# Patient Record
Sex: Female | Born: 1940 | Race: White | Hispanic: No | Smoking: Never smoker
Health system: Southern US, Community
[De-identification: ages and names within clinical notes are randomized; demographics above are authoritative.]

## PROBLEM LIST (undated history)

## (undated) DIAGNOSIS — M797 Fibromyalgia: Secondary | ICD-10-CM

## (undated) DIAGNOSIS — M5431 Sciatica, right side: Secondary | ICD-10-CM

## (undated) DIAGNOSIS — J45909 Unspecified asthma, uncomplicated: Secondary | ICD-10-CM

## (undated) DIAGNOSIS — M719 Bursopathy, unspecified: Secondary | ICD-10-CM

## (undated) DIAGNOSIS — M1990 Unspecified osteoarthritis, unspecified site: Secondary | ICD-10-CM

## (undated) DIAGNOSIS — G4700 Insomnia, unspecified: Secondary | ICD-10-CM

## (undated) DIAGNOSIS — K219 Gastro-esophageal reflux disease without esophagitis: Secondary | ICD-10-CM

## (undated) DIAGNOSIS — E559 Vitamin D deficiency, unspecified: Secondary | ICD-10-CM

## (undated) HISTORY — DX: Bursopathy, unspecified: M71.9

## (undated) HISTORY — PX: OTHER SURGICAL HISTORY: SHX169

## (undated) HISTORY — DX: Insomnia, unspecified: G47.00

## (undated) HISTORY — DX: Sciatica, right side: M54.31

## (undated) HISTORY — DX: Vitamin D deficiency, unspecified: E55.9

---

## 1997-01-12 HISTORY — PX: OTHER SURGICAL HISTORY: SHX169

## 1997-11-22 ENCOUNTER — Ambulatory Visit (HOSPITAL_BASED_OUTPATIENT_CLINIC_OR_DEPARTMENT_OTHER): Admission: RE | Admit: 1997-11-22 | Discharge: 1997-11-22 | Payer: Self-pay | Admitting: Orthopaedic Surgery

## 1998-01-12 HISTORY — PX: ABDOMINAL HYSTERECTOMY: SHX81

## 1999-11-20 ENCOUNTER — Encounter: Admission: RE | Admit: 1999-11-20 | Discharge: 1999-11-20 | Payer: Self-pay | Admitting: Allergy and Immunology

## 1999-11-20 ENCOUNTER — Encounter: Payer: Self-pay | Admitting: Allergy and Immunology

## 2001-03-21 ENCOUNTER — Other Ambulatory Visit: Admission: RE | Admit: 2001-03-21 | Discharge: 2001-03-21 | Payer: Self-pay | Admitting: Obstetrics and Gynecology

## 2001-05-11 ENCOUNTER — Encounter: Admission: RE | Admit: 2001-05-11 | Discharge: 2001-05-11 | Payer: Self-pay | Admitting: Internal Medicine

## 2001-05-11 ENCOUNTER — Encounter: Payer: Self-pay | Admitting: Internal Medicine

## 2001-06-29 ENCOUNTER — Encounter: Payer: Self-pay | Admitting: Neurosurgery

## 2001-07-01 ENCOUNTER — Encounter: Payer: Self-pay | Admitting: Neurosurgery

## 2001-07-01 ENCOUNTER — Observation Stay (HOSPITAL_COMMUNITY): Admission: RE | Admit: 2001-07-01 | Discharge: 2001-07-02 | Payer: Self-pay | Admitting: Neurosurgery

## 2001-11-14 ENCOUNTER — Ambulatory Visit (HOSPITAL_COMMUNITY): Admission: RE | Admit: 2001-11-14 | Discharge: 2001-11-14 | Payer: Self-pay | Admitting: Gastroenterology

## 2002-01-12 HISTORY — PX: LASIK: SHX215

## 2002-01-12 HISTORY — PX: CERVICAL DISC SURGERY: SHX588

## 2002-04-18 ENCOUNTER — Other Ambulatory Visit: Admission: RE | Admit: 2002-04-18 | Discharge: 2002-04-18 | Payer: Self-pay | Admitting: Obstetrics and Gynecology

## 2002-04-26 ENCOUNTER — Ambulatory Visit (HOSPITAL_COMMUNITY): Admission: RE | Admit: 2002-04-26 | Discharge: 2002-04-26 | Payer: Self-pay | Admitting: Gastroenterology

## 2002-08-08 ENCOUNTER — Encounter: Admission: RE | Admit: 2002-08-08 | Discharge: 2002-08-08 | Payer: Self-pay | Admitting: Gastroenterology

## 2002-08-08 ENCOUNTER — Encounter: Payer: Self-pay | Admitting: Gastroenterology

## 2003-05-07 ENCOUNTER — Other Ambulatory Visit: Admission: RE | Admit: 2003-05-07 | Discharge: 2003-05-07 | Payer: Self-pay | Admitting: Obstetrics and Gynecology

## 2004-02-11 ENCOUNTER — Encounter: Admission: RE | Admit: 2004-02-11 | Discharge: 2004-02-11 | Payer: Self-pay | Admitting: Gastroenterology

## 2005-02-05 ENCOUNTER — Encounter: Admission: RE | Admit: 2005-02-05 | Discharge: 2005-02-05 | Payer: Self-pay | Admitting: Gastroenterology

## 2005-02-23 ENCOUNTER — Encounter: Admission: RE | Admit: 2005-02-23 | Discharge: 2005-02-23 | Payer: Self-pay | Admitting: Gastroenterology

## 2006-01-12 HISTORY — PX: OTHER SURGICAL HISTORY: SHX169

## 2006-06-18 ENCOUNTER — Encounter: Admission: RE | Admit: 2006-06-18 | Discharge: 2006-06-18 | Payer: Self-pay | Admitting: Allergy and Immunology

## 2006-07-26 ENCOUNTER — Encounter: Admission: RE | Admit: 2006-07-26 | Discharge: 2006-07-26 | Payer: Self-pay | Admitting: Internal Medicine

## 2007-05-11 ENCOUNTER — Encounter: Admission: RE | Admit: 2007-05-11 | Discharge: 2007-05-11 | Payer: Self-pay | Admitting: Neurosurgery

## 2008-11-28 ENCOUNTER — Other Ambulatory Visit: Admission: RE | Admit: 2008-11-28 | Discharge: 2008-11-28 | Payer: Self-pay | Admitting: Internal Medicine

## 2010-01-17 ENCOUNTER — Encounter
Admission: RE | Admit: 2010-01-17 | Discharge: 2010-01-17 | Payer: Self-pay | Source: Home / Self Care | Attending: Neurological Surgery | Admitting: Neurological Surgery

## 2010-02-01 ENCOUNTER — Encounter: Payer: Self-pay | Admitting: Gastroenterology

## 2010-02-02 ENCOUNTER — Encounter: Payer: Self-pay | Admitting: Gastroenterology

## 2010-05-30 NOTE — Op Note (Signed)
NAME:  Susan Li, Susan Li                           ACCOUNT NO.:  [id number]   MEDICAL RECORD NO.:  [id number]                   PATIENT TYPE:  AMB   LOCATION:  ENDO                                 FACILITY:  [facility]   PHYSICIAN:  Danise Edge, M.D.                DATE OF BIRTH:  02-05-1940   DATE OF PROCEDURE:  04/26/2002  DATE OF DISCHARGE:                                 OPERATIVE REPORT   REFERRING PHYSICIAN:  Candyce Churn, M.D.   PROCEDURE:  Esophagogastroduodenoscopy.   PROCEDURE INDICATION:  Ms. [name] is a 70 year old female, born September 03, 1940.  Ms. [name] has a globus sensation (waxing and waning foreign  body sensation in her hypopharynx).  It does not interfere with swallowing  liquids or solids.  Her sensation seems to have worsened since she underwent  cervical disk surgery with placement of a titanium plate.  Ms. [name] also  has uncontrolled gastroesophageal reflux, manifested by regurgitation and  heartburn.   ENDOSCOPIST:  Charolett Bumpers, M.D.   PREMEDICATION:  1. Versed 10 mg.  2. Demerol 50 mg.   DESCRIPTION OF PROCEDURE:  After obtaining informed consent, Ms. [name] was  placed in the left lateral decubitus position.  I administered intravenous  Demerol and intravenous Versed to achieve conscious sedation for the  procedure.  The patient's blood pressure, oxygen saturation, and cardiac  rhythm were monitored throughout the procedure and documented in the medical  record.   The Olympus gastroscope was passed through the posterior hypopharynx into  the proximal esophagus without difficulty.  Close examination of the  hypopharynx reveals marked edema of the arytenoid cartilages with a normal-  appearing set of vocal cords.  There were no identifiable masses or tumors.  The endoscope easily was passed into the proximal esophagus.   ESOPHAGOSCOPY:  The proximal, mid, and lower segments of the esophagus  appear normal.  The squamocolumnar  junction and esophagogastric junction are  noted at 40 cm from the incisor teeth.  There is no endoscopic evidence for  the presence of Barrett's esophagus, erosive esophagitis, or esophageal  erosions.   GASTROSCOPY:  Retroflexed view of the gastric cardia and fundus was normal.  The gastric body, antrum, and pylorus appeared normal.   DUODENOSCOPY:  The duodenal bulb and descending duodenum appeared normal.   ASSESSMENT:  Normal esophagogastroduodenoscopy.  I am impressed with the  edema of her arytenoid cartilages.   RECOMMENDATIONS:  I suspect Ms. [name]' globus sensation is due to  gastroesophageal laryngeal reflux and not related to the placement of a  titanium plate in her cervical spine.  She is currently only on an H2  blocker.  She continues to have heartburn and regurgitation.   I would recommend placing Ms. [name] on a double dose proton pump  inhibitor.  She did develop diarrhea on Prevacid.  Danise Edge, M.D.    MJ/MEDQ  D:  04/26/2002  T:  04/26/2002  Job:  [id number]   cc:   Candyce Churn, M.D.  [address] [place]  [place] [address]  Fax: [phone]   [name]. [name], M.D.  [address]  [place]  [place] [address]  Fax: [phone]

## 2010-05-30 NOTE — Op Note (Signed)
   NAME:  Susan Li, Susan Li                           ACCOUNT NO.:  [id number]   MEDICAL RECORD NO.:  [id number]                   PATIENT TYPE:  AMB   LOCATION:  ENDO                                 FACILITY[facility]   PHYSICIAN:  Danise Edge, M.D.                DATE OF BIRTH:  Nov 06, 1940   DATE OF PROCEDURE:  11/14/2001  DATE OF DISCHARGE:                                 OPERATIVE REPORT   REFERRING PHYSICIAN:  Candyce Churn, M.D.   PROCEDURE:  Colonoscopy.   PROCEDURE INDICATION:  The patient is a 70 year old female born August 01, 1940.  The patient is scheduled to undergo her first screening colonoscopy  with polypectomy to prevent colon cancer.  I discussed with her the  complications associated with colonoscopy and polypectomy including a 15 per  1000 risk of bleeding and 1 per 1000 risk of colon perforation requiring  surgical repair.  The patient has signed the operative permit.   ENDOSCOPIST:  Charolett Bumpers, M.D.   PREMEDICATION:  Versed 9.5 mg, Demerol 50 mg.   INSTRUMENT USED:  Olympus pediatric colonoscope.   PROCEDURE:  After obtaining informed consent, the patient was placed in the  left lateral decubitus position.  I administered intravenous Demerol and  intravenous Versed to achieve conscious sedation for the procedure.  The  patient's blood pressure, oxygen saturation, and cardiac rhythm were  monitored throughout the procedure and documented in the medical record.   Anal inspection was normal.  Digital rectal examination was normal.  The  Olympus pediatric video colonoscope was introduced into the rectum and  advanced to the cecum.  Colonic preparation for the exam today was  excellent.   Rectum:  Normal.   Sigmoid colon and descending colon:  Normal.   Splenic flexure:  Normal.   Transverse colon:  Normal.   Hepatic flexure:  Normal.   Ascending colon:  Normal.   Cecum and ileocecal valve:  Normal.    ASSESSMENT:  Normal screening  proctocolonoscopy to the cecum.  No endoscopic  evidence for the presence of colorectal neoplasia.                                                   Danise Edge, M.D.    MJ/MEDQ  D:  11/14/2001  T:  11/14/2001  Job:  [id number]   cc:   Candyce Churn, M.D.  [address] [place]  [place] [address]  Fax: [phone]

## 2010-05-30 NOTE — Op Note (Signed)
[place]  Patient:    Susan Li, Susan Li Visit Number: [id number] MRN: [id number]          Service Type: SUR Location: [id number] Attending Physician:  Josie Saunders Dictated by: [name] [name], M.D. Proc. Date: 07/01/01 Admit Date: 07/01/2001 Discharge Date: 07/02/2001             Operative Report  PREOPERATIVE DIAGNOSIS:  Herniated cervical disk and cervical spondylosis, degenerative disk disease and cervical radiculopathy at C5-6 and C6-7 levels.  POSTOPERATIVE DIAGNOSIS:  Herniated cervical disk and cervical spondylosis, degenerative disk disease and cervical radiculopathy at C5-6 and C6-7 levels  OPERATION PERFORMED:  Anterior cervical diskectomy and fusion at C5-6 and C6-7 levels with allograft bone graft and anterior cervical plate.  SURGEON:  [name]. [name], M.D.  ASSISTANT:  Hewitt Shorts, M.D.  ANESTHESIA:  General endotracheal.  ESTIMATED BLOOD LOSS:  Less than 50 cc.  COMPLICATIONS:  None.  DISPOSITION:  Recovery.  INDICATIONS FOR PROCEDURE:  The patient is a 70 year old woman with left upper extremity pain and weakness secondary to cervical spondylitic foraminal stenosis and radiculopathy at the C5-6 and C6-7 levels.  It was elected to take her to surgery for anterior cervical diskectomyand fusion at those affected levels.  DESCRIPTION OF PROCEDURE:The patient was brought to the operating room. Following satisfactory and uncomplicated induction of general endotracheal anesthesia and placement of intravenous lines, the patient was placed in a supine position on the operating table.  Her neck was placed in slight extension and she was placed in 10 pounds of halter traction.  Her anterior neck was prepped and draped in the usual sterile fashion.  The area of planned incision was infiltrated with 0.25% Marcaine and 0.5% lidocaine with 1:200,000 epinephrine.  Incision was made from the midline to the anterior  border of the sternocleidomastoid muscle and carried sharply to the platysmal layer. Subplatysmal dissection was performed and the anterior cervical spine was exposed between the carotid sheath lateral and trachea and esophagus medial and a spinal needle was placed at what was felt to be the C5-6 level and intraoperative x-ray confirmed this to be the C5-6 level.  Subsequently the longus coli muscles were taken down from the anterior cervical spine fromC5 through C7 levels bilaterally using electrocautery and periosteal elevator. The Shadowline self-retaining retractor was placed to facilitate exposure as was up and down retraction.  The disk spaces at C5-6 and C6-7were then incised with a 15 blade and disk material was removed in piecemeal fashion. The interspaces curetted with Carlens microcurets.  The microscope was brought into the field.  Disk space spreaders were used to facilitate exposure. There was marked spondylitic disease at each level.  C5-6 was more affected than the C7 level.  There was significant uncinatespurring left, greater than right, C5-6 greater than C6-7.  These spurs were drilled down with the Anspach drill with an A-2 equivalent bur and subsequently the posterior longitudinal ligament was incised with an arachnoid knife and removed in a piecemeal fashion.  Both the C7 nerve roots were decompressed as was the central spinal cord dura.  Subsequently, an 8mm iliac crest tricortical bone graft was found to fit appropriately after sizing with NuVasive sizers and then was inserted and countersunk appropriately.  Attention was then turned to the C5-6 level where similar decompression and drilling of the osteophytes was performed. The central spinal cord dura was decompressed.  The C6 nerve root had a very vertical take off and had been  significantly compressed by spondylitic material as well as a large uncinate spur. Upon removal of the uncinate spur the nerve root was  felt to be well decompressed.  The right side was not as affected as the left.  Again, hemostasis was assured with Gelfoam soaked in thrombin and a similarly sized 8 mm tricortical iliac crest bone graft was inserted in the interspace and countersunk appropriately.  Subsequently, the microscope was taken out of the field and a 34 mm anterior cervical reflex cervical plate was then affixed to the anterior cervical spine using 12 mm x 4.0 mm variable angle screws two at C5, two at C6 and two at C7. Allscrews had excellent purchase.  Locking mechanisms were engaged at each level.  The plate was affixed well at the anterior cervical spine.  The final x-ray confirmed positioning of the bone graft at the anterior cervical plate.  The wound was then copiously irrigated with bacitracin saline. The halter traction had been removed.  The platysma layer was reapproximated with 3-0 Vicryl sutures and skin edges were reapproximated with running 4-0 Vicryl subcuticular stitch.  The wound was dressed with benzoin and Steri-Strips, Telfa gauze and tape.  The patient was extubated in theoperating room and taken to recovery room in stable and satisfactory condition having tolerated the operation well.  Counts were correct at the end of the case. Dictated by:   [name] [name], M.D. Attending Physician:  Josie Saunders DD:  07/01/01 TD:  07/04/01 Job: [id number]

## 2010-11-05 ENCOUNTER — Inpatient Hospital Stay (HOSPITAL_BASED_OUTPATIENT_CLINIC_OR_DEPARTMENT_OTHER)
Admission: RE | Admit: 2010-11-05 | Discharge: 2010-11-05 | Disposition: A | Discharge status: Home / Self Care | Payer: Medicare Other | Source: Ambulatory Visit | Attending: Orthopedic Surgery | Admitting: Orthopedic Surgery

## 2010-11-07 ENCOUNTER — Other Ambulatory Visit: Payer: Self-pay | Admitting: Orthopedic Surgery

## 2010-11-07 ENCOUNTER — Ambulatory Visit (HOSPITAL_BASED_OUTPATIENT_CLINIC_OR_DEPARTMENT_OTHER)
Admission: RE | Admit: 2010-11-07 | Discharge: 2010-11-07 | Disposition: A | Discharge status: Home / Self Care | Payer: Medicare Other | Source: Ambulatory Visit | Attending: Orthopedic Surgery | Admitting: Orthopedic Surgery

## 2010-11-07 DIAGNOSIS — D2110 Benign neoplasm of connective and other soft tissue of unspecified upper limb, including shoulder: Secondary | ICD-10-CM | POA: Insufficient documentation

## 2010-11-07 DIAGNOSIS — Z01812 Encounter for preprocedural laboratory examination: Secondary | ICD-10-CM | POA: Insufficient documentation

## 2010-11-07 DIAGNOSIS — Z01810 Encounter for preprocedural cardiovascular examination: Secondary | ICD-10-CM | POA: Insufficient documentation

## 2010-11-07 LAB — POCT HEMOGLOBIN-HEMACUE: Hemoglobin: 13.7 g/dL (ref 12.0–15.0)

## 2010-11-14 NOTE — Op Note (Signed)
  [name], Susan Li                 ACCOUNT NO.:  [id number]  MEDICAL RECORD NO.:  [id number]  LOCATION:                                 FACILITY:  PHYSICIAN:  Cindee Salt, M.D.       DATE OF BIRTH:  1940/03/12  DATE OF PROCEDURE:  11/07/2010 DATE OF DISCHARGE:                              OPERATIVE REPORT   PREOPERATIVE DIAGNOSIS:  Mucoid tumor, right little finger.  POSTOPERATIVE DIAGNOSIS:  Mucoid tumor, right little finger.  OPERATION:  Excision mucoid cyst, debridement distal phalangeal joint, right little finger.  SURGEON:  Cindee Salt, MD  ANESTHESIA:  Forearm based IV regional with local infiltration.  ANESTHESIOLOGIST:  Janetta Hora. Frederick, MD.  HISTORY:  The patient is a 70 year old female with a history of a mass on the dorsal aspect PIP joint of her right little finger.  This is transilluminate.  X-rays revealed degenerative changes.  She desires having this removed.  Pre, peri, and postoperative course have been discussed along with risks and complications.  She is aware there is no guarantee with the surgery, possibility of infection, recurrence, injury to arteries, nerves, tendons, incomplete relief of symptoms, dystrophy. In the preoperative area, the patient was seen, the extremity marked by both the patient and surgeon.  Antibiotic given.  PROCEDURE:  The patient was brought to the operating room where a forearm based IV regional anesthetic was carried out without difficulty. She was prepped using ChloraPrep, supine position with the right arm free.  A 3-minute dry time was allowed.  Time-out taken, confirming the patient and procedure.  A curvilinear incision was made over the mass, the skin excised along with the underlying cyst.  Bleeders were electrocauterized with bipolar.  The dissection carried down opening the joint on its radial aspect.  A rongeur was then used to do a synovectomy of the wrist and removed osteophytes from the middle phalanx.   The specimen was sent to pathology.  A small curette was used to smooth the bone where the osteophytes were removed.  The wound was copiously irrigated with saline and the skin closed with interrupted 5-0 Vicryl Rapide sutures.  A metacarpal block was then given with 0.25% Marcaine without epinephrine and approximately 5 mL was used.  A sterile compressive dressing and splint to the finger was applied.  On deflation of the tourniquet, remaining fingers pinked.  She was taken to the recovery room for observation in satisfactory condition.  She will be discharged home to return to the [facility] of [place] in 1 week on Vicodin.          ______________________________ Cindee Salt, M.D.     GK/MEDQ  D:  11/07/2010  T:  11/07/2010  Job:  [id number]  Electronically Signed by Cindee Salt M.D. on 11/14/2010 03:49:15 PM

## 2011-01-19 DIAGNOSIS — Z23 Encounter for immunization: Secondary | ICD-10-CM | POA: Diagnosis not present

## 2011-02-17 DIAGNOSIS — Z23 Encounter for immunization: Secondary | ICD-10-CM | POA: Diagnosis not present

## 2011-06-15 ENCOUNTER — Other Ambulatory Visit: Payer: Self-pay | Admitting: Ophthalmology

## 2012-01-18 ENCOUNTER — Other Ambulatory Visit: Payer: Self-pay | Admitting: Internal Medicine

## 2012-01-18 DIAGNOSIS — M5412 Radiculopathy, cervical region: Secondary | ICD-10-CM

## 2012-01-18 DIAGNOSIS — Z79899 Other long term (current) drug therapy: Secondary | ICD-10-CM | POA: Diagnosis not present

## 2012-01-18 DIAGNOSIS — IMO0001 Reserved for inherently not codable concepts without codable children: Secondary | ICD-10-CM | POA: Diagnosis not present

## 2012-01-18 DIAGNOSIS — Z1331 Encounter for screening for depression: Secondary | ICD-10-CM | POA: Diagnosis not present

## 2012-01-18 DIAGNOSIS — Z0000 Encounter for general adult medical examination without abnormal findings: Secondary | ICD-10-CM | POA: Diagnosis not present

## 2012-01-18 DIAGNOSIS — J45909 Unspecified asthma, uncomplicated: Secondary | ICD-10-CM | POA: Diagnosis not present

## 2012-01-18 DIAGNOSIS — K219 Gastro-esophageal reflux disease without esophagitis: Secondary | ICD-10-CM | POA: Diagnosis not present

## 2012-01-21 ENCOUNTER — Other Ambulatory Visit: Payer: Medicare Other

## 2012-01-28 ENCOUNTER — Ambulatory Visit
Admission: RE | Admit: 2012-01-28 | Discharge: 2012-01-28 | Disposition: A | Discharge status: Home / Self Care | Payer: Medicare Other | Source: Ambulatory Visit | Attending: Internal Medicine | Admitting: Internal Medicine

## 2012-01-28 DIAGNOSIS — M5412 Radiculopathy, cervical region: Secondary | ICD-10-CM

## 2012-01-28 DIAGNOSIS — M542 Cervicalgia: Secondary | ICD-10-CM | POA: Diagnosis not present

## 2012-01-28 MED ORDER — GADOBENATE DIMEGLUMINE 529 MG/ML IV SOLN
13.0000 mL | Freq: Once | INTRAVENOUS | Status: AC | PRN
  Administered 2012-01-28: 13 mL via INTRAVENOUS

## 2012-02-08 DIAGNOSIS — IMO0001 Reserved for inherently not codable concepts without codable children: Secondary | ICD-10-CM | POA: Diagnosis not present

## 2012-02-08 DIAGNOSIS — M549 Dorsalgia, unspecified: Secondary | ICD-10-CM | POA: Diagnosis not present

## 2012-02-08 DIAGNOSIS — M2550 Pain in unspecified joint: Secondary | ICD-10-CM | POA: Diagnosis not present

## 2012-02-08 DIAGNOSIS — M1990 Unspecified osteoarthritis, unspecified site: Secondary | ICD-10-CM | POA: Diagnosis not present

## 2012-02-29 DIAGNOSIS — Z1231 Encounter for screening mammogram for malignant neoplasm of breast: Secondary | ICD-10-CM | POA: Diagnosis not present

## 2012-03-10 ENCOUNTER — Other Ambulatory Visit: Payer: Self-pay | Admitting: Gastroenterology

## 2012-03-21 DIAGNOSIS — IMO0001 Reserved for inherently not codable concepts without codable children: Secondary | ICD-10-CM | POA: Diagnosis not present

## 2012-03-21 DIAGNOSIS — M2550 Pain in unspecified joint: Secondary | ICD-10-CM | POA: Diagnosis not present

## 2012-03-21 DIAGNOSIS — M1990 Unspecified osteoarthritis, unspecified site: Secondary | ICD-10-CM | POA: Diagnosis not present

## 2012-04-12 ENCOUNTER — Encounter (HOSPITAL_COMMUNITY): Payer: Self-pay | Admitting: *Deleted

## 2012-04-18 ENCOUNTER — Encounter (HOSPITAL_COMMUNITY): Payer: Self-pay | Admitting: Pharmacy Technician

## 2012-04-25 ENCOUNTER — Encounter (HOSPITAL_COMMUNITY): Payer: Self-pay | Admitting: Anesthesiology

## 2012-04-25 ENCOUNTER — Encounter (HOSPITAL_COMMUNITY)
Admission: RE | Disposition: A | Discharge status: Home / Self Care | Payer: Self-pay | Source: Ambulatory Visit | Attending: Gastroenterology

## 2012-04-25 ENCOUNTER — Encounter (HOSPITAL_COMMUNITY): Payer: Self-pay | Admitting: *Deleted

## 2012-04-25 ENCOUNTER — Ambulatory Visit (HOSPITAL_COMMUNITY): Payer: Medicare Other | Admitting: Anesthesiology

## 2012-04-25 ENCOUNTER — Ambulatory Visit (HOSPITAL_COMMUNITY)
Admission: RE | Admit: 2012-04-25 | Discharge: 2012-04-25 | Disposition: A | Discharge status: Home / Self Care | Payer: Medicare Other | Source: Ambulatory Visit | Attending: Gastroenterology | Admitting: Gastroenterology

## 2012-04-25 DIAGNOSIS — IMO0001 Reserved for inherently not codable concepts without codable children: Secondary | ICD-10-CM | POA: Diagnosis not present

## 2012-04-25 DIAGNOSIS — Z981 Arthrodesis status: Secondary | ICD-10-CM | POA: Diagnosis not present

## 2012-04-25 DIAGNOSIS — K589 Irritable bowel syndrome without diarrhea: Secondary | ICD-10-CM | POA: Insufficient documentation

## 2012-04-25 DIAGNOSIS — J45909 Unspecified asthma, uncomplicated: Secondary | ICD-10-CM | POA: Insufficient documentation

## 2012-04-25 DIAGNOSIS — G4700 Insomnia, unspecified: Secondary | ICD-10-CM | POA: Diagnosis not present

## 2012-04-25 DIAGNOSIS — K219 Gastro-esophageal reflux disease without esophagitis: Secondary | ICD-10-CM | POA: Diagnosis not present

## 2012-04-25 DIAGNOSIS — Z885 Allergy status to narcotic agent status: Secondary | ICD-10-CM | POA: Diagnosis not present

## 2012-04-25 DIAGNOSIS — Z1211 Encounter for screening for malignant neoplasm of colon: Secondary | ICD-10-CM | POA: Insufficient documentation

## 2012-04-25 DIAGNOSIS — J309 Allergic rhinitis, unspecified: Secondary | ICD-10-CM | POA: Insufficient documentation

## 2012-04-25 HISTORY — DX: Unspecified asthma, uncomplicated: J45.909

## 2012-04-25 HISTORY — PX: COLONOSCOPY WITH PROPOFOL: SHX5780

## 2012-04-25 HISTORY — DX: Gastro-esophageal reflux disease without esophagitis: K21.9

## 2012-04-25 HISTORY — DX: Unspecified osteoarthritis, unspecified site: M19.90

## 2012-04-25 HISTORY — DX: Fibromyalgia: M79.7

## 2012-04-25 SURGERY — COLONOSCOPY WITH PROPOFOL
Anesthesia: Monitor Anesthesia Care

## 2012-04-25 MED ORDER — PROPOFOL 10 MG/ML IV EMUL
INTRAVENOUS | Status: DC | PRN
  Administered 2012-04-25: 40 mg via INTRAVENOUS
  Administered 2012-04-25 (×2): 20 mg via INTRAVENOUS

## 2012-04-25 MED ORDER — LACTATED RINGERS IV SOLN
INTRAVENOUS | Status: DC | PRN
  Administered 2012-04-25: 13:00:00 via INTRAVENOUS

## 2012-04-25 MED ORDER — SODIUM CHLORIDE 0.9 % IV SOLN: INTRAVENOUS | Status: DC

## 2012-04-25 MED ORDER — PROPOFOL 10 MG/ML IV EMUL
INTRAVENOUS | Status: DC | PRN
  Administered 2012-04-25: 140 ug/kg/min via INTRAVENOUS

## 2012-04-25 MED ORDER — LACTATED RINGERS IV SOLN
INTRAVENOUS | Status: DC
  Administered 2012-04-25: 13:00:00 via INTRAVENOUS

## 2012-04-25 SURGICAL SUPPLY — 21 items

## 2012-04-25 NOTE — Transfer of Care (Signed)
Immediate Anesthesia Transfer of Care Note  Patient: Susan Li  Procedure(s) Performed: Procedure(s): COLONOSCOPY WITH PROPOFOL (N/A)  Patient Location: PACU  Anesthesia Type:MAC  Level of Consciousness: awake, alert , oriented and patient cooperative  Airway & Oxygen Therapy: Patient Spontanous Breathing and Patient connected to face mask oxygen  Post-op Assessment: Report given to PACU RN and Post -op Vital signs reviewed and stable  Post vital signs: Reviewed and stable  Complications: No apparent anesthesia complications

## 2012-04-25 NOTE — Anesthesia Postprocedure Evaluation (Signed)
  Anesthesia Post-op Note  Patient: Susan Li  Procedure(s) Performed: Procedure(s) (LRB): COLONOSCOPY WITH PROPOFOL (N/A)  Patient Location: PACU  Anesthesia Type: MAC  Level of Consciousness: awake and alert   Airway and Oxygen Therapy: Patient Spontanous Breathing  Post-op Pain: mild  Post-op Assessment: Post-op Vital signs reviewed, Patient's Cardiovascular Status Stable, Respiratory Function Stable, Patent Airway and No signs of Nausea or vomiting  Last Vitals:  Filed Vitals:   04/25/12 1440  BP: 157/94  Pulse:   Temp:   Resp: 14    Post-op Vital Signs: stable   Complications: No apparent anesthesia complications

## 2012-04-25 NOTE — Op Note (Signed)
Procedure: Screening colon  Endoscopist: Danise Edge  Premedication: Propofol administered by anesthesia  Procedure: The patient was placed in the left lateral decubitus position. Anal inspection and digital rectal exam were normal. The Pentax pediatric colonoscope was introduced into the rectum and advanced to the cecum as identified by normal-appearing appendiceal orifice and ileocecal valve. Colonic preparation for the exam today was good.  Rectum. Normal. Retroflex view of the distal rectum normal.  Sigmoid colon and descending colon. Normal.  Splenic flexure. Normal.  Transverse colon. Normal.  Hepatic flexure. Normal.  Ascending colon. Normal.  Cecum and ileocecal valve. Normal.  Assessment: Normal screening proctocolonoscopy to the cecum.

## 2012-04-25 NOTE — H&P (Signed)
  Physical exam: The patient is alert and lying comfortably on the endoscopy stretcher. Mouth and throat appear normal. Lungs are clear to auscultation. Cardiac exam reveals a regular rhythm. Abdomen is soft, flat, and nontender to palpation in all quadrants

## 2012-04-25 NOTE — Anesthesia Preprocedure Evaluation (Addendum)
Anesthesia Evaluation  Patient identified by MRN, date of birth, ID band Patient awake    Reviewed: Allergy & Precautions, H&P , NPO status , Patient's Chart, lab work & pertinent test results  Airway Mallampati: II TM Distance: >3 FB Neck ROM: Full    Dental no notable dental hx.    Pulmonary asthma ,  breath sounds clear to auscultation  Pulmonary exam normal       Cardiovascular negative cardio ROS  Rhythm:Regular Rate:Normal     Neuro/Psych  Headaches,  Neuromuscular disease negative psych ROS   GI/Hepatic Neg liver ROS, GERD-  Medicated,  Endo/Other  negative endocrine ROS  Renal/GU negative Renal ROS  negative genitourinary   Musculoskeletal  (+) Fibromyalgia -  Abdominal   Peds negative pediatric ROS (+)  Hematology negative hematology ROS (+)   Anesthesia Other Findings   Reproductive/Obstetrics negative OB ROS                          Anesthesia Physical Anesthesia Plan  ASA: II  Anesthesia Plan: MAC   Post-op Pain Management:    Induction: Intravenous  Airway Management Planned:   Additional Equipment:   Intra-op Plan:   Post-operative Plan:   Informed Consent: I have reviewed the patients History and Physical, chart, labs and discussed the procedure including the risks, benefits and alternatives for the proposed anesthesia with the patient or authorized representative who has indicated his/her understanding and acceptance.   Dental advisory given  Plan Discussed with: CRNA  Anesthesia Plan Comments:         Anesthesia Quick Evaluation

## 2012-04-25 NOTE — Preoperative (Signed)
Beta Blockers   Reason not to administer Beta Blockers:Not Applicable 

## 2012-04-25 NOTE — H&P (Signed)
  Procedure: Screening colonoscopy.  History: The patient is a 72 year old female born August 17, 1940.  On 07/14/2001, the patient underwent a normal screening colonoscopy. On April 26, 2002, the patient underwent a normal esophagogastroduodenoscopy.  The patient is scheduled to undergo a repeat screening colonoscopy with polypectomy to prevent colon cancer  Medication allergies: Morphine causes nose itching  Chronic medications: Albuterol. Advil. Flonase. Claritin. Prevacid. Ambien. Estradiol. Symbicort. Cymbalta.  Past medical and surgical history: Irritable bowel syndrome. Insomnia. Gastroesophageal reflux. Cervical degenerative joint disease. Asthma. Allergic rhinitis. Chronic tinnitus. Cervical spine fusion surgery. Total abdominal hysterectomy. Fibromyalgia.  Habits: The patient has never smoked cigarettes. He consumes alcohol in moderation.  Plan: Proceed with repeat screening colonoscopy with polypectomy to prevent colon cancer using propofol sedation.

## 2012-04-26 ENCOUNTER — Encounter (HOSPITAL_COMMUNITY): Payer: Self-pay | Admitting: Gastroenterology

## 2012-06-23 DIAGNOSIS — H2510 Age-related nuclear cataract, unspecified eye: Secondary | ICD-10-CM | POA: Diagnosis not present

## 2012-06-23 DIAGNOSIS — H524 Presbyopia: Secondary | ICD-10-CM | POA: Diagnosis not present

## 2012-06-23 DIAGNOSIS — H52209 Unspecified astigmatism, unspecified eye: Secondary | ICD-10-CM | POA: Diagnosis not present

## 2012-07-25 DIAGNOSIS — G4700 Insomnia, unspecified: Secondary | ICD-10-CM | POA: Diagnosis not present

## 2012-07-25 DIAGNOSIS — R42 Dizziness and giddiness: Secondary | ICD-10-CM | POA: Diagnosis not present

## 2012-07-25 DIAGNOSIS — H9319 Tinnitus, unspecified ear: Secondary | ICD-10-CM | POA: Diagnosis not present

## 2012-07-25 DIAGNOSIS — G479 Sleep disorder, unspecified: Secondary | ICD-10-CM | POA: Diagnosis not present

## 2012-07-25 DIAGNOSIS — F329 Major depressive disorder, single episode, unspecified: Secondary | ICD-10-CM | POA: Diagnosis not present

## 2012-08-05 DIAGNOSIS — J45909 Unspecified asthma, uncomplicated: Secondary | ICD-10-CM | POA: Diagnosis not present

## 2012-08-05 DIAGNOSIS — J309 Allergic rhinitis, unspecified: Secondary | ICD-10-CM | POA: Diagnosis not present

## 2012-08-18 DIAGNOSIS — H9319 Tinnitus, unspecified ear: Secondary | ICD-10-CM | POA: Diagnosis not present

## 2012-08-18 DIAGNOSIS — H905 Unspecified sensorineural hearing loss: Secondary | ICD-10-CM | POA: Diagnosis not present

## 2012-08-18 DIAGNOSIS — M542 Cervicalgia: Secondary | ICD-10-CM | POA: Diagnosis not present

## 2012-08-18 DIAGNOSIS — J45909 Unspecified asthma, uncomplicated: Secondary | ICD-10-CM | POA: Diagnosis not present

## 2012-08-18 DIAGNOSIS — J309 Allergic rhinitis, unspecified: Secondary | ICD-10-CM | POA: Diagnosis not present

## 2012-08-26 DIAGNOSIS — J45909 Unspecified asthma, uncomplicated: Secondary | ICD-10-CM | POA: Diagnosis not present

## 2012-08-26 DIAGNOSIS — J309 Allergic rhinitis, unspecified: Secondary | ICD-10-CM | POA: Diagnosis not present

## 2012-09-06 DIAGNOSIS — F4323 Adjustment disorder with mixed anxiety and depressed mood: Secondary | ICD-10-CM | POA: Diagnosis not present

## 2012-09-28 DIAGNOSIS — F4323 Adjustment disorder with mixed anxiety and depressed mood: Secondary | ICD-10-CM | POA: Diagnosis not present

## 2012-10-07 DIAGNOSIS — J45909 Unspecified asthma, uncomplicated: Secondary | ICD-10-CM | POA: Diagnosis not present

## 2012-10-07 DIAGNOSIS — J309 Allergic rhinitis, unspecified: Secondary | ICD-10-CM | POA: Diagnosis not present

## 2012-11-09 DIAGNOSIS — F4323 Adjustment disorder with mixed anxiety and depressed mood: Secondary | ICD-10-CM | POA: Diagnosis not present

## 2013-01-03 DIAGNOSIS — F4323 Adjustment disorder with mixed anxiety and depressed mood: Secondary | ICD-10-CM | POA: Diagnosis not present

## 2013-01-17 DIAGNOSIS — F4323 Adjustment disorder with mixed anxiety and depressed mood: Secondary | ICD-10-CM | POA: Diagnosis not present

## 2013-01-31 DIAGNOSIS — J45901 Unspecified asthma with (acute) exacerbation: Secondary | ICD-10-CM | POA: Diagnosis not present

## 2013-02-06 DIAGNOSIS — F4323 Adjustment disorder with mixed anxiety and depressed mood: Secondary | ICD-10-CM | POA: Diagnosis not present

## 2013-02-13 DIAGNOSIS — K219 Gastro-esophageal reflux disease without esophagitis: Secondary | ICD-10-CM | POA: Diagnosis not present

## 2013-02-13 DIAGNOSIS — R498 Other voice and resonance disorders: Secondary | ICD-10-CM | POA: Diagnosis not present

## 2013-02-15 DIAGNOSIS — H9319 Tinnitus, unspecified ear: Secondary | ICD-10-CM | POA: Diagnosis not present

## 2013-02-15 DIAGNOSIS — H93239 Hyperacusis, unspecified ear: Secondary | ICD-10-CM | POA: Diagnosis not present

## 2013-02-15 DIAGNOSIS — H903 Sensorineural hearing loss, bilateral: Secondary | ICD-10-CM | POA: Diagnosis not present

## 2013-02-17 DIAGNOSIS — Z136 Encounter for screening for cardiovascular disorders: Secondary | ICD-10-CM | POA: Diagnosis not present

## 2013-02-17 DIAGNOSIS — E559 Vitamin D deficiency, unspecified: Secondary | ICD-10-CM | POA: Diagnosis not present

## 2013-02-17 DIAGNOSIS — J45909 Unspecified asthma, uncomplicated: Secondary | ICD-10-CM | POA: Diagnosis not present

## 2013-02-17 DIAGNOSIS — H9319 Tinnitus, unspecified ear: Secondary | ICD-10-CM | POA: Diagnosis not present

## 2013-02-17 DIAGNOSIS — G479 Sleep disorder, unspecified: Secondary | ICD-10-CM | POA: Diagnosis not present

## 2013-02-17 DIAGNOSIS — IMO0001 Reserved for inherently not codable concepts without codable children: Secondary | ICD-10-CM | POA: Diagnosis not present

## 2013-02-17 DIAGNOSIS — Z0000 Encounter for general adult medical examination without abnormal findings: Secondary | ICD-10-CM | POA: Diagnosis not present

## 2013-02-17 DIAGNOSIS — G4700 Insomnia, unspecified: Secondary | ICD-10-CM | POA: Diagnosis not present

## 2013-02-17 DIAGNOSIS — Z23 Encounter for immunization: Secondary | ICD-10-CM | POA: Diagnosis not present

## 2013-02-17 DIAGNOSIS — Z1331 Encounter for screening for depression: Secondary | ICD-10-CM | POA: Diagnosis not present

## 2013-02-27 DIAGNOSIS — F4323 Adjustment disorder with mixed anxiety and depressed mood: Secondary | ICD-10-CM | POA: Diagnosis not present

## 2013-03-07 DIAGNOSIS — J45909 Unspecified asthma, uncomplicated: Secondary | ICD-10-CM | POA: Diagnosis not present

## 2013-03-07 DIAGNOSIS — J309 Allergic rhinitis, unspecified: Secondary | ICD-10-CM | POA: Diagnosis not present

## 2013-03-17 DIAGNOSIS — F411 Generalized anxiety disorder: Secondary | ICD-10-CM | POA: Diagnosis not present

## 2013-03-17 DIAGNOSIS — G479 Sleep disorder, unspecified: Secondary | ICD-10-CM | POA: Diagnosis not present

## 2013-03-22 DIAGNOSIS — F4323 Adjustment disorder with mixed anxiety and depressed mood: Secondary | ICD-10-CM | POA: Diagnosis not present

## 2013-03-31 DIAGNOSIS — K219 Gastro-esophageal reflux disease without esophagitis: Secondary | ICD-10-CM | POA: Diagnosis not present

## 2013-04-10 DIAGNOSIS — Z1231 Encounter for screening mammogram for malignant neoplasm of breast: Secondary | ICD-10-CM | POA: Diagnosis not present

## 2013-06-28 DIAGNOSIS — F411 Generalized anxiety disorder: Secondary | ICD-10-CM | POA: Diagnosis not present

## 2013-06-28 DIAGNOSIS — F3289 Other specified depressive episodes: Secondary | ICD-10-CM | POA: Diagnosis not present

## 2013-06-28 DIAGNOSIS — F329 Major depressive disorder, single episode, unspecified: Secondary | ICD-10-CM | POA: Diagnosis not present

## 2013-07-06 DIAGNOSIS — F3289 Other specified depressive episodes: Secondary | ICD-10-CM | POA: Diagnosis not present

## 2013-07-06 DIAGNOSIS — J339 Nasal polyp, unspecified: Secondary | ICD-10-CM | POA: Diagnosis not present

## 2013-07-06 DIAGNOSIS — F411 Generalized anxiety disorder: Secondary | ICD-10-CM | POA: Diagnosis not present

## 2013-07-06 DIAGNOSIS — F329 Major depressive disorder, single episode, unspecified: Secondary | ICD-10-CM | POA: Diagnosis not present

## 2013-07-06 DIAGNOSIS — J309 Allergic rhinitis, unspecified: Secondary | ICD-10-CM | POA: Diagnosis not present

## 2013-07-31 DIAGNOSIS — Q667 Congenital pes cavus, unspecified foot: Secondary | ICD-10-CM | POA: Diagnosis not present

## 2013-08-16 DIAGNOSIS — H52209 Unspecified astigmatism, unspecified eye: Secondary | ICD-10-CM | POA: Diagnosis not present

## 2013-08-16 DIAGNOSIS — H2510 Age-related nuclear cataract, unspecified eye: Secondary | ICD-10-CM | POA: Diagnosis not present

## 2013-08-30 DIAGNOSIS — M545 Low back pain, unspecified: Secondary | ICD-10-CM | POA: Diagnosis not present

## 2013-08-30 DIAGNOSIS — M25579 Pain in unspecified ankle and joints of unspecified foot: Secondary | ICD-10-CM | POA: Diagnosis not present

## 2013-09-06 DIAGNOSIS — M545 Low back pain, unspecified: Secondary | ICD-10-CM | POA: Diagnosis not present

## 2013-09-06 DIAGNOSIS — IMO0002 Reserved for concepts with insufficient information to code with codable children: Secondary | ICD-10-CM | POA: Diagnosis not present

## 2013-09-11 DIAGNOSIS — M545 Low back pain, unspecified: Secondary | ICD-10-CM | POA: Diagnosis not present

## 2013-09-11 DIAGNOSIS — IMO0002 Reserved for concepts with insufficient information to code with codable children: Secondary | ICD-10-CM | POA: Diagnosis not present

## 2013-09-25 DIAGNOSIS — IMO0002 Reserved for concepts with insufficient information to code with codable children: Secondary | ICD-10-CM | POA: Diagnosis not present

## 2013-09-25 DIAGNOSIS — M545 Low back pain, unspecified: Secondary | ICD-10-CM | POA: Diagnosis not present

## 2013-09-27 DIAGNOSIS — M545 Low back pain, unspecified: Secondary | ICD-10-CM | POA: Diagnosis not present

## 2013-09-27 DIAGNOSIS — M25579 Pain in unspecified ankle and joints of unspecified foot: Secondary | ICD-10-CM | POA: Diagnosis not present

## 2013-09-27 DIAGNOSIS — IMO0002 Reserved for concepts with insufficient information to code with codable children: Secondary | ICD-10-CM | POA: Diagnosis not present

## 2013-10-02 DIAGNOSIS — M545 Low back pain, unspecified: Secondary | ICD-10-CM | POA: Diagnosis not present

## 2013-10-02 DIAGNOSIS — IMO0002 Reserved for concepts with insufficient information to code with codable children: Secondary | ICD-10-CM | POA: Diagnosis not present

## 2013-10-04 DIAGNOSIS — M545 Low back pain, unspecified: Secondary | ICD-10-CM | POA: Diagnosis not present

## 2013-10-04 DIAGNOSIS — IMO0002 Reserved for concepts with insufficient information to code with codable children: Secondary | ICD-10-CM | POA: Diagnosis not present

## 2013-10-09 DIAGNOSIS — M545 Low back pain, unspecified: Secondary | ICD-10-CM | POA: Diagnosis not present

## 2013-10-09 DIAGNOSIS — IMO0002 Reserved for concepts with insufficient information to code with codable children: Secondary | ICD-10-CM | POA: Diagnosis not present

## 2013-10-11 DIAGNOSIS — M545 Low back pain, unspecified: Secondary | ICD-10-CM | POA: Diagnosis not present

## 2013-10-11 DIAGNOSIS — IMO0002 Reserved for concepts with insufficient information to code with codable children: Secondary | ICD-10-CM | POA: Diagnosis not present

## 2013-10-18 DIAGNOSIS — M5431 Sciatica, right side: Secondary | ICD-10-CM | POA: Diagnosis not present

## 2013-10-18 DIAGNOSIS — M545 Low back pain: Secondary | ICD-10-CM | POA: Diagnosis not present

## 2013-10-19 DIAGNOSIS — M5431 Sciatica, right side: Secondary | ICD-10-CM | POA: Diagnosis not present

## 2013-10-19 DIAGNOSIS — M545 Low back pain: Secondary | ICD-10-CM | POA: Diagnosis not present

## 2013-11-06 DIAGNOSIS — M545 Low back pain: Secondary | ICD-10-CM | POA: Diagnosis not present

## 2013-11-06 DIAGNOSIS — M5431 Sciatica, right side: Secondary | ICD-10-CM | POA: Diagnosis not present

## 2013-11-07 DIAGNOSIS — J370 Chronic laryngitis: Secondary | ICD-10-CM | POA: Diagnosis not present

## 2013-11-07 DIAGNOSIS — J4540 Moderate persistent asthma, uncomplicated: Secondary | ICD-10-CM | POA: Diagnosis not present

## 2013-11-07 DIAGNOSIS — J309 Allergic rhinitis, unspecified: Secondary | ICD-10-CM | POA: Diagnosis not present

## 2013-11-09 DIAGNOSIS — M5431 Sciatica, right side: Secondary | ICD-10-CM | POA: Diagnosis not present

## 2013-11-09 DIAGNOSIS — M545 Low back pain: Secondary | ICD-10-CM | POA: Diagnosis not present

## 2013-11-13 DIAGNOSIS — M545 Low back pain: Secondary | ICD-10-CM | POA: Diagnosis not present

## 2013-11-13 DIAGNOSIS — M5431 Sciatica, right side: Secondary | ICD-10-CM | POA: Diagnosis not present

## 2013-11-20 DIAGNOSIS — M545 Low back pain: Secondary | ICD-10-CM | POA: Diagnosis not present

## 2013-11-20 DIAGNOSIS — M5431 Sciatica, right side: Secondary | ICD-10-CM | POA: Diagnosis not present

## 2013-11-27 DIAGNOSIS — M5431 Sciatica, right side: Secondary | ICD-10-CM | POA: Diagnosis not present

## 2013-11-27 DIAGNOSIS — M545 Low back pain: Secondary | ICD-10-CM | POA: Diagnosis not present

## 2013-11-30 DIAGNOSIS — M5431 Sciatica, right side: Secondary | ICD-10-CM | POA: Diagnosis not present

## 2013-11-30 DIAGNOSIS — M545 Low back pain: Secondary | ICD-10-CM | POA: Diagnosis not present

## 2013-12-04 DIAGNOSIS — M5431 Sciatica, right side: Secondary | ICD-10-CM | POA: Diagnosis not present

## 2013-12-04 DIAGNOSIS — M545 Low back pain: Secondary | ICD-10-CM | POA: Diagnosis not present

## 2013-12-11 DIAGNOSIS — M545 Low back pain: Secondary | ICD-10-CM | POA: Diagnosis not present

## 2013-12-11 DIAGNOSIS — M5431 Sciatica, right side: Secondary | ICD-10-CM | POA: Diagnosis not present

## 2014-03-01 ENCOUNTER — Other Ambulatory Visit: Payer: Self-pay | Admitting: Gastroenterology

## 2014-03-01 DIAGNOSIS — R1013 Epigastric pain: Secondary | ICD-10-CM

## 2014-03-08 ENCOUNTER — Ambulatory Visit
Admission: RE | Admit: 2014-03-08 | Discharge: 2014-03-08 | Disposition: A | Discharge status: Home / Self Care | Payer: Medicare Other | Source: Ambulatory Visit | Attending: Gastroenterology | Admitting: Gastroenterology

## 2014-03-08 DIAGNOSIS — R1013 Epigastric pain: Secondary | ICD-10-CM

## 2014-03-12 ENCOUNTER — Other Ambulatory Visit: Payer: Self-pay | Admitting: Gastroenterology

## 2014-03-12 DIAGNOSIS — R1013 Epigastric pain: Secondary | ICD-10-CM

## 2014-03-15 ENCOUNTER — Ambulatory Visit
Admission: RE | Admit: 2014-03-15 | Discharge: 2014-03-15 | Disposition: A | Discharge status: Home / Self Care | Payer: Medicare Other | Source: Ambulatory Visit | Attending: Gastroenterology | Admitting: Gastroenterology

## 2014-03-15 DIAGNOSIS — K219 Gastro-esophageal reflux disease without esophagitis: Secondary | ICD-10-CM | POA: Diagnosis not present

## 2014-03-15 DIAGNOSIS — R1013 Epigastric pain: Secondary | ICD-10-CM

## 2014-03-15 DIAGNOSIS — K449 Diaphragmatic hernia without obstruction or gangrene: Secondary | ICD-10-CM | POA: Diagnosis not present

## 2014-04-02 DIAGNOSIS — H9313 Tinnitus, bilateral: Secondary | ICD-10-CM | POA: Diagnosis not present

## 2014-04-02 DIAGNOSIS — E559 Vitamin D deficiency, unspecified: Secondary | ICD-10-CM | POA: Diagnosis not present

## 2014-04-02 DIAGNOSIS — M75102 Unspecified rotator cuff tear or rupture of left shoulder, not specified as traumatic: Secondary | ICD-10-CM | POA: Diagnosis not present

## 2014-04-02 DIAGNOSIS — Z1389 Encounter for screening for other disorder: Secondary | ICD-10-CM | POA: Diagnosis not present

## 2014-04-02 DIAGNOSIS — Z0001 Encounter for general adult medical examination with abnormal findings: Secondary | ICD-10-CM | POA: Diagnosis not present

## 2014-04-02 DIAGNOSIS — M5431 Sciatica, right side: Secondary | ICD-10-CM | POA: Diagnosis not present

## 2014-04-02 DIAGNOSIS — F329 Major depressive disorder, single episode, unspecified: Secondary | ICD-10-CM | POA: Diagnosis not present

## 2014-04-02 DIAGNOSIS — Z79899 Other long term (current) drug therapy: Secondary | ICD-10-CM | POA: Diagnosis not present

## 2014-04-02 DIAGNOSIS — G4700 Insomnia, unspecified: Secondary | ICD-10-CM | POA: Diagnosis not present

## 2014-04-02 DIAGNOSIS — R5382 Chronic fatigue, unspecified: Secondary | ICD-10-CM | POA: Diagnosis not present

## 2014-04-10 DIAGNOSIS — J309 Allergic rhinitis, unspecified: Secondary | ICD-10-CM | POA: Diagnosis not present

## 2014-04-10 DIAGNOSIS — J4540 Moderate persistent asthma, uncomplicated: Secondary | ICD-10-CM | POA: Diagnosis not present

## 2014-04-27 DIAGNOSIS — J019 Acute sinusitis, unspecified: Secondary | ICD-10-CM | POA: Diagnosis not present

## 2014-05-02 ENCOUNTER — Ambulatory Visit: Payer: Medicare Other | Admitting: Podiatry

## 2014-07-11 DIAGNOSIS — M5412 Radiculopathy, cervical region: Secondary | ICD-10-CM | POA: Diagnosis not present

## 2014-07-25 ENCOUNTER — Encounter: Payer: Self-pay | Admitting: Podiatry

## 2014-07-25 ENCOUNTER — Ambulatory Visit (INDEPENDENT_AMBULATORY_CARE_PROVIDER_SITE_OTHER): Payer: Medicare Other | Admitting: Podiatry

## 2014-07-25 VITALS — BP 111/69 | HR 73 | Resp 15

## 2014-07-25 DIAGNOSIS — L600 Ingrowing nail: Secondary | ICD-10-CM

## 2014-07-25 NOTE — Patient Instructions (Signed)

## 2014-07-25 NOTE — Progress Notes (Signed)
   Subjective:    Patient ID: Susan Li, female    DOB: [birth year], 74 y.o.   MRN: [id number]  HPI Patient presents with ingrown toenail on Left foot, great toe, medial side. This has been going on for the past year.   Review of Systems  HENT: Positive for hearing loss.   Musculoskeletal: Positive for back pain.       Objective:   Physical Exam        Assessment & Plan:

## 2014-07-25 NOTE — Progress Notes (Signed)
Subjective:     Patient ID: Susan Li, female   DOB: November 15, 1940, 74 y.o.   MRN: [id number]  HPI patient states I have a painful ingrown toenail my left big toe which is been bothering me now for around a year and I've tried to trim it and soak it without relief with no drainage   Review of Systems  All other systems reviewed and are negative.      Objective:   Physical Exam  Constitutional: She is oriented to person, place, and time.  Cardiovascular: Intact distal pulses.   Musculoskeletal: Normal range of motion.  Neurological: She is oriented to person, place, and time.  Skin: Skin is warm.  Nursing note and vitals reviewed.  neurovascular status intact with muscle strength adequate range of motion within normal limits and an incurvated left hallux medial border that's painful when pressed. Patient's tried to trim it soak it without relief and there is no drainage or distal redness noted and there is obvious signs of inflamed tissue. Patient has good digital perfusion well oriented 3     Assessment:     Ingrown toenail deformity left hallux medial border with no current infection    Plan:     H&P and condition reviewed with patient. I've recommended removal of corner and explained risk and patient wants procedure and today I infiltrated the left hallux 60 mg Xylocaine Marcaine mixture removed the medial border exposed matrix and applied phenol 3 applications 30 seconds followed by alcohol lavage and sterile dressings. Gave instructions on soaks and reappoint

## 2014-08-08 DIAGNOSIS — M7542 Impingement syndrome of left shoulder: Secondary | ICD-10-CM | POA: Diagnosis not present

## 2014-08-10 DIAGNOSIS — M25512 Pain in left shoulder: Secondary | ICD-10-CM | POA: Diagnosis not present

## 2014-08-20 ENCOUNTER — Other Ambulatory Visit: Payer: Self-pay | Admitting: Orthopaedic Surgery

## 2014-08-20 DIAGNOSIS — M7542 Impingement syndrome of left shoulder: Secondary | ICD-10-CM | POA: Diagnosis not present

## 2014-08-21 ENCOUNTER — Encounter (HOSPITAL_BASED_OUTPATIENT_CLINIC_OR_DEPARTMENT_OTHER): Payer: Self-pay | Admitting: *Deleted

## 2014-08-22 NOTE — H&P (Signed)
Susan Li is an 74 y.o. female.   Chief Complaint: Left shoulder pain HPI: Susan Li continues with left shoulder pain. The injection helped for a few days to a week. She's been through an MRI scan since last time she was in. She has trouble reaching out and up and cannot sleep on that side at night without discomfort. She does take a sleeping pill to help. Her pain is intermittent and severe and getting worse. She has been struggling with this for about a year at this point.   MRI:  I reviewed an MRI scan films and report of a study done at the [facility] on 08/10/14. This shows some cuff tendinopathy but no significant tear. She does have moderate degenerative change at her [facility] joint.  Past Medical History  Diagnosis Date  . Asthma     Symbicort as needed 3x week  . GERD (gastroesophageal reflux disease)   . Headache(784.0)   . Fibromyalgia   . Arthritis     hips,backs,hands; osteoarthritis    Past Surgical History  Procedure Laterality Date  . Abdominal hysterectomy  2000  . Buninonectomy  2008  . Lasik  2004  . Cervical disc surgery  2004  . Tendernitis surgery  99  . Colonoscopy with propofol N/A 04/25/2012    Procedure: COLONOSCOPY WITH PROPOFOL;  Surgeon: Garlan Fair, MD;  Location: [facility];  Service: Endoscopy;  Laterality: N/A;    History reviewed. No pertinent family history. Social History:  reports that she has never smoked. She has never used smokeless tobacco. She reports that she drinks about 1.2 oz of alcohol per week. She reports that she does not use illicit drugs.  Allergies:  Allergies  Allergen Reactions  . Morphine And Related Itching    Itching nose    No prescriptions prior to admission    No results found for this or any previous visit (from the past 48 hour(s)). No results found.  Review of Systems  Musculoskeletal: Positive for joint pain.       Left shoulder  All other systems reviewed and are negative.   Height 5\' 4"  (1.626 m),  weight 61.689 kg (136 lb). Physical Exam  Constitutional: She is oriented to person, place, and time. She appears well-developed and well-nourished.  HENT:  Head: Normocephalic and atraumatic.  Eyes: Pupils are equal, round, and reactive to light.  Neck: Normal range of motion.  Cardiovascular: Normal rate and regular rhythm.   Respiratory: Effort normal.  GI: Soft.  Musculoskeletal:  Left shoulder motion is nearly full. She has pain to primary and secondary impingement testing. Today she does have pain at her [facility] joint and pain to cross chest maneuvers. She has good cuff strength though it's very painful especially in resisted external rotation. Cervical motion is full and there is no palpable lymphadenopathy. She has intact sensation and motor function in her hands.   Neurological: She is alert and oriented to person, place, and time.  Skin: Skin is warm and dry.  Psychiatric: She has a normal mood and affect. Her behavior is normal. Judgment and thought content normal.     Assessment/Plan Assessment: Left shoulder impingement and AC pain injected 07/11/14  Plan: Susan Li would be best served with a shoulder arthroscopy. I reviewed risk of anesthesia and infection related to an outpatient procedure where we will perform an acromioplasty and a formal AC resection. It does not look as though we will have to repair her cuff based on her MRI scan.  She will need some therapy afterwards.  Susan Li, Larwance Sachs 08/22/2014, 12:30 PM

## 2014-08-24 ENCOUNTER — Ambulatory Visit (HOSPITAL_BASED_OUTPATIENT_CLINIC_OR_DEPARTMENT_OTHER): Payer: Medicare Other | Admitting: Anesthesiology

## 2014-08-24 ENCOUNTER — Encounter (HOSPITAL_BASED_OUTPATIENT_CLINIC_OR_DEPARTMENT_OTHER): Payer: Self-pay | Admitting: Anesthesiology

## 2014-08-24 ENCOUNTER — Encounter (HOSPITAL_BASED_OUTPATIENT_CLINIC_OR_DEPARTMENT_OTHER)
Admission: RE | Disposition: A | Discharge status: Home / Self Care | Payer: Self-pay | Source: Ambulatory Visit | Attending: Orthopaedic Surgery

## 2014-08-24 ENCOUNTER — Ambulatory Visit (HOSPITAL_BASED_OUTPATIENT_CLINIC_OR_DEPARTMENT_OTHER)
Admission: RE | Admit: 2014-08-24 | Discharge: 2014-08-24 | Disposition: A | Discharge status: Home / Self Care | Payer: Medicare Other | Source: Ambulatory Visit | Attending: Orthopaedic Surgery | Admitting: Orthopaedic Surgery

## 2014-08-24 DIAGNOSIS — M7552 Bursitis of left shoulder: Secondary | ICD-10-CM | POA: Diagnosis not present

## 2014-08-24 DIAGNOSIS — M7542 Impingement syndrome of left shoulder: Secondary | ICD-10-CM | POA: Insufficient documentation

## 2014-08-24 DIAGNOSIS — M160 Bilateral primary osteoarthritis of hip: Secondary | ICD-10-CM | POA: Diagnosis not present

## 2014-08-24 DIAGNOSIS — M479 Spondylosis, unspecified: Secondary | ICD-10-CM | POA: Diagnosis not present

## 2014-08-24 DIAGNOSIS — M19042 Primary osteoarthritis, left hand: Secondary | ICD-10-CM | POA: Insufficient documentation

## 2014-08-24 DIAGNOSIS — M797 Fibromyalgia: Secondary | ICD-10-CM | POA: Insufficient documentation

## 2014-08-24 DIAGNOSIS — Z885 Allergy status to narcotic agent status: Secondary | ICD-10-CM | POA: Diagnosis not present

## 2014-08-24 DIAGNOSIS — M24012 Loose body in left shoulder: Secondary | ICD-10-CM | POA: Diagnosis not present

## 2014-08-24 DIAGNOSIS — M19041 Primary osteoarthritis, right hand: Secondary | ICD-10-CM | POA: Diagnosis not present

## 2014-08-24 DIAGNOSIS — K219 Gastro-esophageal reflux disease without esophagitis: Secondary | ICD-10-CM | POA: Diagnosis not present

## 2014-08-24 DIAGNOSIS — J45909 Unspecified asthma, uncomplicated: Secondary | ICD-10-CM | POA: Insufficient documentation

## 2014-08-24 DIAGNOSIS — M19012 Primary osteoarthritis, left shoulder: Secondary | ICD-10-CM | POA: Diagnosis not present

## 2014-08-24 DIAGNOSIS — M25512 Pain in left shoulder: Secondary | ICD-10-CM | POA: Diagnosis not present

## 2014-08-24 DIAGNOSIS — Z90710 Acquired absence of both cervix and uterus: Secondary | ICD-10-CM | POA: Diagnosis not present

## 2014-08-24 DIAGNOSIS — G8918 Other acute postprocedural pain: Secondary | ICD-10-CM | POA: Diagnosis not present

## 2014-08-24 HISTORY — PX: RESECTION DISTAL CLAVICAL: SHX5053

## 2014-08-24 HISTORY — PX: SHOULDER ARTHROSCOPY WITH SUBACROMIAL DECOMPRESSION: SHX5684

## 2014-08-24 LAB — POCT HEMOGLOBIN-HEMACUE: Hemoglobin: 12.8 g/dL (ref 12.0–15.0)

## 2014-08-24 SURGERY — SHOULDER ARTHROSCOPY WITH SUBACROMIAL DECOMPRESSION
Anesthesia: General | Site: Shoulder | Laterality: Left

## 2014-08-24 MED ORDER — MIDAZOLAM HCL 2 MG/2ML IJ SOLN
1.0000 mg | INTRAMUSCULAR | Status: DC | PRN
  Administered 2014-08-24: 1 mg via INTRAVENOUS

## 2014-08-24 MED ORDER — SCOPOLAMINE 1 MG/3DAYS TD PT72: 1.0000 | MEDICATED_PATCH | Freq: Once | TRANSDERMAL | Status: DC | PRN

## 2014-08-24 MED ORDER — FENTANYL CITRATE (PF) 100 MCG/2ML IJ SOLN
INTRAMUSCULAR | Status: DC | PRN
  Administered 2014-08-24 (×3): 50 ug via INTRAVENOUS

## 2014-08-24 MED ORDER — SUCCINYLCHOLINE CHLORIDE 20 MG/ML IJ SOLN
INTRAMUSCULAR | Status: DC | PRN
  Administered 2014-08-24: 50 mg via INTRAVENOUS

## 2014-08-24 MED ORDER — METOCLOPRAMIDE HCL 10 MG/10ML PO SOLN
10.0000 mg | Freq: Once | ORAL | Status: AC
  Administered 2014-08-24: 10 mg via ORAL

## 2014-08-24 MED ORDER — CEFAZOLIN SODIUM-DEXTROSE 2-3 GM-% IV SOLR
2.0000 g | INTRAVENOUS | Status: AC
  Administered 2014-08-24: 2 g via INTRAVENOUS

## 2014-08-24 MED ORDER — FENTANYL CITRATE (PF) 100 MCG/2ML IJ SOLN
INTRAMUSCULAR | Status: AC
  Filled 2014-08-24: qty 2

## 2014-08-24 MED ORDER — CEFAZOLIN SODIUM-DEXTROSE 2-3 GM-% IV SOLR
INTRAVENOUS | Status: AC
  Filled 2014-08-24: qty 50

## 2014-08-24 MED ORDER — METOCLOPRAMIDE HCL 5 MG/ML IJ SOLN
INTRAMUSCULAR | Status: AC
  Filled 2014-08-24: qty 2

## 2014-08-24 MED ORDER — CHLORHEXIDINE GLUCONATE 4 % EX LIQD: 60.0000 mL | Freq: Once | CUTANEOUS | Status: DC

## 2014-08-24 MED ORDER — LIDOCAINE HCL (CARDIAC) 20 MG/ML IV SOLN
INTRAVENOUS | Status: DC | PRN
  Administered 2014-08-24: 50 mg via INTRAVENOUS

## 2014-08-24 MED ORDER — METOCLOPRAMIDE HCL 10 MG/10ML PO SOLN: 10.0000 mg | Freq: Three times a day (TID) | ORAL | Status: DC

## 2014-08-24 MED ORDER — LACTATED RINGERS IV SOLN
INTRAVENOUS | Status: DC
  Administered 2014-08-24 (×2): via INTRAVENOUS

## 2014-08-24 MED ORDER — PROPOFOL 10 MG/ML IV BOLUS
INTRAVENOUS | Status: DC | PRN
  Administered 2014-08-24: 200 mg via INTRAVENOUS

## 2014-08-24 MED ORDER — OXYCODONE HCL 5 MG PO TABS: 5.0000 mg | ORAL_TABLET | Freq: Four times a day (QID) | ORAL | Status: DC | PRN

## 2014-08-24 MED ORDER — GLYCOPYRROLATE 0.2 MG/ML IJ SOLN: 0.2000 mg | Freq: Once | INTRAMUSCULAR | Status: DC | PRN

## 2014-08-24 MED ORDER — LACTATED RINGERS IV SOLN
INTRAVENOUS | Status: DC
  Administered 2014-08-24: 12:00:00 via INTRAVENOUS

## 2014-08-24 MED ORDER — FENTANYL CITRATE (PF) 100 MCG/2ML IJ SOLN
50.0000 ug | INTRAMUSCULAR | Status: DC | PRN
  Administered 2014-08-24: 50 ug via INTRAVENOUS

## 2014-08-24 MED ORDER — DEXAMETHASONE SODIUM PHOSPHATE 4 MG/ML IJ SOLN
INTRAMUSCULAR | Status: DC | PRN
  Administered 2014-08-24: 10 mg via INTRAVENOUS

## 2014-08-24 MED ORDER — MIDAZOLAM HCL 2 MG/2ML IJ SOLN
INTRAMUSCULAR | Status: AC
  Filled 2014-08-24: qty 2

## 2014-08-24 MED ORDER — FENTANYL CITRATE (PF) 100 MCG/2ML IJ SOLN
INTRAMUSCULAR | Status: AC
  Filled 2014-08-24: qty 6

## 2014-08-24 MED ORDER — EPHEDRINE SULFATE 50 MG/ML IJ SOLN
INTRAMUSCULAR | Status: DC | PRN
  Administered 2014-08-24: 10 mg via INTRAVENOUS

## 2014-08-24 MED ORDER — SODIUM CHLORIDE 0.9 % IR SOLN
Status: DC | PRN
  Administered 2014-08-24: 8000 mL

## 2014-08-24 SURGICAL SUPPLY — 70 items
BENZOIN TINCTURE PRP APPL 2/3 (GAUZE/BANDAGES/DRESSINGS) IMPLANT
BLADE CUDA 5.5 (BLADE) IMPLANT
BLADE GREAT WHITE 4.2 (BLADE) ×2 IMPLANT
BLADE GREAT WHITE 4.2MM (BLADE) ×1
BLADE SURG 15 STRL LF DISP TIS (BLADE) IMPLANT
BLADE SURG 15 STRL SS (BLADE)
BUR VERTEX HOODED 4.5 (BURR) ×3 IMPLANT
CANNULA SHOULDER 7CM (CANNULA) ×3 IMPLANT
CANNULA TWIST IN 8.25X7CM (CANNULA) IMPLANT
CLOSURE WOUND 1/2 X4 (GAUZE/BANDAGES/DRESSINGS)
DECANTER SPIKE VIAL GLASS SM (MISCELLANEOUS) IMPLANT
DRAPE STERI 35X30 U-POUCH (DRAPES) ×3 IMPLANT
DRAPE U-SHAPE 47X51 STRL (DRAPES) ×3 IMPLANT
DRAPE U-SHAPE 76X120 STRL (DRAPES) ×6 IMPLANT
DRSG EMULSION OIL 3X3 NADH (GAUZE/BANDAGES/DRESSINGS) ×3 IMPLANT
DRSG PAD ABDOMINAL 8X10 ST (GAUZE/BANDAGES/DRESSINGS) ×3 IMPLANT
DURAPREP 26ML APPLICATOR (WOUND CARE) ×3 IMPLANT
ELECT MENISCUS 165MM 90D (ELECTRODE) IMPLANT
ELECT REM PT RETURN 9FT ADLT (ELECTROSURGICAL)
ELECTRODE REM PT RTRN 9FT ADLT (ELECTROSURGICAL) IMPLANT
GAUZE SPONGE 4X4 12PLY STRL (GAUZE/BANDAGES/DRESSINGS) ×3 IMPLANT
GLOVE BIO SURGEON STRL SZ 6.5 (GLOVE) ×2 IMPLANT
GLOVE BIO SURGEON STRL SZ8 (GLOVE) ×6 IMPLANT
GLOVE BIO SURGEONS STRL SZ 6.5 (GLOVE) ×1
GLOVE BIOGEL PI IND STRL 7.0 (GLOVE) ×3 IMPLANT
GLOVE BIOGEL PI IND STRL 8 (GLOVE) ×2 IMPLANT
GLOVE BIOGEL PI INDICATOR 7.0 (GLOVE) ×6
GLOVE BIOGEL PI INDICATOR 8 (GLOVE) ×4
GLOVE ECLIPSE 6.5 STRL STRAW (GLOVE) ×3 IMPLANT
GOWN STRL REUS W/ TWL LRG LVL3 (GOWN DISPOSABLE) ×2 IMPLANT
GOWN STRL REUS W/ TWL XL LVL3 (GOWN DISPOSABLE) ×2 IMPLANT
GOWN STRL REUS W/TWL LRG LVL3 (GOWN DISPOSABLE) ×4
GOWN STRL REUS W/TWL XL LVL3 (GOWN DISPOSABLE) ×4
IV NS IRRIG 3000ML ARTHROMATIC (IV SOLUTION) ×9 IMPLANT
LIQUID BAND (GAUZE/BANDAGES/DRESSINGS) IMPLANT
MANIFOLD NEPTUNE II (INSTRUMENTS) ×3 IMPLANT
NDL SUT 6 .5 CRC .975X.05 MAYO (NEEDLE) IMPLANT
NEEDLE MAYO TAPER (NEEDLE)
NEEDLE SCORPION MULTI FIRE (NEEDLE) IMPLANT
NS IRRIG 1000ML POUR BTL (IV SOLUTION) IMPLANT
PACK ARTHROSCOPY DSU (CUSTOM PROCEDURE TRAY) ×3 IMPLANT
PACK BASIN DAY SURGERY FS (CUSTOM PROCEDURE TRAY) ×3 IMPLANT
PASSER SUT SWANSON 36MM LOOP (INSTRUMENTS) IMPLANT
PENCIL BUTTON HOLSTER BLD 10FT (ELECTRODE) IMPLANT
SET ARTHROSCOPY TUBING (MISCELLANEOUS) ×2
SET ARTHROSCOPY TUBING LN (MISCELLANEOUS) ×1 IMPLANT
SHEET MEDIUM DRAPE 40X70 STRL (DRAPES) IMPLANT
SLEEVE SCD COMPRESS KNEE MED (MISCELLANEOUS) ×3 IMPLANT
SLING ARM LRG ADULT FOAM STRAP (SOFTGOODS) ×3 IMPLANT
SLING ARM MED ADULT FOAM STRAP (SOFTGOODS) IMPLANT
SLING ARM SM FOAM STRAP (SOFTGOODS) IMPLANT
SLING ARM XL FOAM STRAP (SOFTGOODS) IMPLANT
SPONGE LAP 4X18 X RAY DECT (DISPOSABLE) IMPLANT
STRIP CLOSURE SKIN 1/2X4 (GAUZE/BANDAGES/DRESSINGS) IMPLANT
SUCTION FRAZIER TIP 10 FR DISP (SUCTIONS) IMPLANT
SUT ETHIBOND 2 OS 4 DA (SUTURE) IMPLANT
SUT ETHILON 3 0 PS 1 (SUTURE) ×3 IMPLANT
SUT FIBERWIRE #2 38 T-5 BLUE (SUTURE)
SUT PDS AB 2-0 CT2 27 (SUTURE) IMPLANT
SUT VIC AB 0 SH 27 (SUTURE) IMPLANT
SUT VIC AB 2-0 SH 27 (SUTURE)
SUT VIC AB 2-0 SH 27XBRD (SUTURE) IMPLANT
SUT VICRYL 4-0 PS2 18IN ABS (SUTURE) IMPLANT
SUTURE FIBERWR #2 38 T-5 BLUE (SUTURE) IMPLANT
SYR BULB 3OZ (MISCELLANEOUS) IMPLANT
TOWEL OR 17X24 6PK STRL BLUE (TOWEL DISPOSABLE) ×3 IMPLANT
TOWEL OR NON WOVEN STRL DISP B (DISPOSABLE) ×3 IMPLANT
WAND STAR VAC 90 (SURGICAL WAND) ×3 IMPLANT
WATER STERILE IRR 1000ML POUR (IV SOLUTION) ×3 IMPLANT
YANKAUER SUCT BULB TIP NO VENT (SUCTIONS) IMPLANT

## 2014-08-24 NOTE — Anesthesia Postprocedure Evaluation (Signed)
  Anesthesia Post-op Note  Patient: Susan Li  Procedure(s) Performed: Procedure(s): ARTHROSCOPY  LEFT SHOULDER, SUBACROMIAL DECOMPRESSION, DISTAL CLAVICLE RESECTION AND DEBRIDEMENT (Left) RESECTION DISTAL CLAVICAL (Left)  Patient Location: PACU  Anesthesia Type:General and GA combined with regional for post-op pain  Level of Consciousness: awake, alert  and oriented  Airway and Oxygen Therapy: Patient Spontanous Breathing and Patient connected to nasal cannula oxygen  Post-op Pain: mild  Post-op Assessment: Post-op Vital signs reviewed, Patient's Cardiovascular Status Stable, Respiratory Function Stable, Patent Airway and Pain level controlled              Post-op Vital Signs: stable  Last Vitals:  Filed Vitals:   08/24/14 1645  BP: 164/89  Pulse: 79  Temp: 36.5 C  Resp: 18    Complications: No apparent anesthesia complications

## 2014-08-24 NOTE — Op Note (Signed)
#  [id number] 

## 2014-08-24 NOTE — Anesthesia Preprocedure Evaluation (Signed)
Anesthesia Evaluation  Patient identified by MRN, date of birth, ID band Patient awake    Reviewed: Allergy & Precautions, NPO status , Patient's Chart, lab work & pertinent test results  Airway Mallampati: II  TM Distance: >3 FB Neck ROM: Full    Dental  (+) Teeth Intact, Dental Advisory Given   Pulmonary  breath sounds clear to auscultation        Cardiovascular Rhythm:Regular Rate:Normal     Neuro/Psych    GI/Hepatic   Endo/Other    Renal/GU      Musculoskeletal   Abdominal   Peds  Hematology   Anesthesia Other Findings   Reproductive/Obstetrics                             Anesthesia Physical Anesthesia Plan  ASA: II  Anesthesia Plan: General   Post-op Pain Management: MAC Combined w/ Regional for Post-op pain   Induction: Intravenous  Airway Management Planned: Oral ETT  Additional Equipment:   Intra-op Plan:   Post-operative Plan: Extubation in OR  Informed Consent: I have reviewed the patients History and Physical, chart, labs and discussed the procedure including the risks, benefits and alternatives for the proposed anesthesia with the patient or authorized representative who has indicated his/her understanding and acceptance.   Dental advisory given  Plan Discussed with: CRNA and Anesthesiologist  Anesthesia Plan Comments:         Anesthesia Quick Evaluation

## 2014-08-24 NOTE — Transfer of Care (Signed)
Immediate Anesthesia Transfer of Care Note  Patient: Susan Li  Procedure(s) Performed: Procedure(s): ARTHROSCOPY  LEFT SHOULDER, SUBACROMIAL DECOMPRESSION, DISTAL CLAVICLE RESECTION AND DEBRIDEMENT (Left) RESECTION DISTAL CLAVICAL (Left)  Patient Location: PACU  Anesthesia Type:GA combined with regional for post-op pain  Level of Consciousness: awake and patient cooperative  Airway & Oxygen Therapy: Patient Spontanous Breathing and Patient connected to face mask oxygen  Post-op Assessment: Report given to RN and Post -op Vital signs reviewed and stable  Post vital signs: Reviewed and stable  Last Vitals:  Filed Vitals:   08/24/14 1524  BP:   Pulse: 107  Temp:   Resp: 18    Complications: No apparent anesthesia complications

## 2014-08-24 NOTE — Interval H&P Note (Signed)
OK for surgery PD 

## 2014-08-24 NOTE — Progress Notes (Signed)
Assisted Dr. Joslin with left, ultrasound guided, interscalene  block. Side rails up, monitors on throughout procedure. See vital signs in flow sheet. Tolerated Procedure well. 

## 2014-08-24 NOTE — Discharge Instructions (Signed)
° ° °  Regional Anesthesia Blocks ° °1. Numbness or the inability to move the "blocked" extremity may last from 3-48 hours after placement. The length of time depends on the medication injected and your individual response to the medication. If the numbness is not going away after 48 hours, call your surgeon. ° °2. The extremity that is blocked will need to be protected until the numbness is gone and the  Strength has returned. Because you cannot feel it, you will need to take extra care to avoid injury. Because it may be weak, you may have difficulty moving it or using it. You may not know what position it is in without looking at it while the block is in effect. ° °3. For blocks in the legs and feet, returning to weight bearing and walking needs to be done carefully. You will need to wait until the numbness is entirely gone and the strength has returned. You should be able to move your leg and foot normally before you try and bear weight or walk. You will need someone to be with you when you first try to ensure you do not fall and possibly risk injury. ° °4. Bruising and tenderness at the needle site are common side effects and will resolve in a few days. ° °5. Persistent numbness or new problems with movement should be communicated to the surgeon or the [facility] ([phone])/ [facility] ([phone]). ° ° ° °Post Anesthesia Home Care Instructions ° °Activity: °Get plenty of rest for the remainder of the day. A responsible adult should stay with you for 24 hours following the procedure.  °For the next 24 hours, DO NOT: °-Drive a car °-Operate machinery °-Drink alcoholic beverages °-Take any medication unless instructed by your physician °-Make any legal decisions or sign important papers. ° °Meals: °Start with liquid foods such as gelatin or soup. Progress to regular foods as tolerated. Avoid greasy, spicy, heavy foods. If nausea and/or vomiting occur, drink only clear liquids until  the nausea and/or vomiting subsides. Call your physician if vomiting continues. ° °Special Instructions/Symptoms: °Your throat may feel dry or sore from the anesthesia or the breathing tube placed in your throat during surgery. If this causes discomfort, gargle with warm salt water. The discomfort should disappear within 24 hours. ° °If you had a scopolamine patch placed behind your ear for the management of post- operative nausea and/or vomiting: ° °1. The medication in the patch is effective for 72 hours, after which it should be removed.  Wrap patch in a tissue and discard in the trash. Wash hands thoroughly with soap and water. °2. You may remove the patch earlier than 72 hours if you experience unpleasant side effects which may include dry mouth, dizziness or visual disturbances. °3. Avoid touching the patch. Wash your hands with soap and water after contact with the patch. °  ° °

## 2014-08-24 NOTE — Anesthesia Procedure Notes (Addendum)
Anesthesia Regional Block:  Interscalene brachial plexus block  Pre-Anesthetic Checklist: ,, timeout performed, Correct Patient, Correct Site, Correct Laterality, Correct Procedure, Correct Position, site marked, Risks and benefits discussed,  Surgical consent,  Pre-op evaluation,  At surgeon's request and post-op pain management  Laterality: Left  Prep: chloraprep       Needles:  Injection technique: Single-shot  Needle Type: Echogenic Stimulator Needle     Needle Length: 9cm 9 cm Needle Gauge: 21 and 21 G    Additional Needles:  Procedures: ultrasound guided (picture in chart) Interscalene brachial plexus block Narrative:  Start time: 08/24/2014 1:35 PM End time: 08/24/2014 1:40 PM Injection made incrementally with aspirations every 5 mL.  Performed by: Personally   Additional Notes: 20 cc 0.5% bupivacaine with 1:200 epi injected easily   Procedure Name: Intubation Date/Time: 08/24/2014 2:28 PM Performed by: Toula Moos L Pre-anesthesia Checklist: Patient identified, Emergency Drugs available, Suction available, Patient being monitored and Timeout performed Patient Re-evaluated:Patient Re-evaluated prior to inductionOxygen Delivery Method: Circle System Utilized Preoxygenation: Pre-oxygenation with 100% oxygen Intubation Type: IV induction Ventilation: Mask ventilation without difficulty Laryngoscope Size: Miller and 3 Grade View: Grade II Tube type: Oral Tube size: 7.0 mm Number of attempts: 1 Airway Equipment and Method: Stylet and Oral airway Placement Confirmation: ETT inserted through vocal cords under direct vision,  positive ETCO2 and breath sounds checked- equal and bilateral Secured at: 21 cm Tube secured with: Tape Dental Injury: Teeth and Oropharynx as per pre-operative assessment

## 2014-08-27 ENCOUNTER — Encounter (HOSPITAL_BASED_OUTPATIENT_CLINIC_OR_DEPARTMENT_OTHER): Payer: Self-pay | Admitting: Orthopaedic Surgery

## 2014-08-27 NOTE — Op Note (Signed)
Susan Li, Susan Li                 ACCOUNT NO.:  [id number]  MEDICAL RECORD NO.:  [id number]  LOCATION:                               FACILITY:  [facility]  PHYSICIAN:  Monico Blitz. Fabricio Endsley, M.D.DATE OF BIRTH:  12/05/1940  DATE OF PROCEDURE:  08/24/2014 DATE OF DISCHARGE:  08/24/2014                              OPERATIVE REPORT   PREOPERATIVE DIAGNOSIS: 1. Left shoulder impingement. 2. Left shoulder acromioclavicular degeneration.  POSTOPERATIVE DIAGNOSIS: 1. Left shoulder impingement. 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  PROCEDURE: 1. Left shoulder arthroscopic acromioplasty. 2. Left shoulder arthroscopic AC resection. 3. Left shoulder arthroscopic debridement with loose body removal.  ANESTHESIA:  General and block.  ATTENDING SURGEON:  Monico Blitz. Rhona Raider, M.D.  ASSISTANT:  Loni Dolly, PA.  INDICATION FOR PROCEDURE:  The patient is a 74 year old woman with a long history of left shoulder pain.  This has persisted despite multiple conservative measures.  By MRI scan, she has AC degeneration and things consistent with impingement and cuff tendinosis, but without a full- thickness cuff tear.  She has pain which limits her ability to use her arm and to rest, and she is offered an arthroscopy.  Informed operative consent was obtained after discussion of possible complications including reaction to anesthesia and infection.  SUMMARY OF FINDINGS AND PROCEDURE:  Under general anesthesia and a block, a left shoulder arthroscopy was performed.  Glenohumeral joint did show some grade 3 change fairly broad with small areas of grade 4 change.  She did have a 5 or 6-mm loose body which we removed.  The biceps tendon and rotator cuff appeared benign from below as did all labral structures.  The glenohumeral joint was addressed with a chondroplasty and removal of a small loose body.  In the subacromial space, she had a very prominent  subacromial morphology addressed with an acromioplasty and also had bone-on-bone contact at the [facility] joint addressed with resection of the distal clavicle removing about a centimeter of this bone.  She had some bursitis and partial-thickness tearing of the bursal aspect of the cuff and a debridement was done, but no tear worthy of repair was found.  She was discharged home the same day.  DESCRIPTION OF PROCEDURE:  The patient was taken to the operating suite where general anesthetic was applied without difficulty.  She was also given a block in the pre-anesthesia area.  She was positioned in a beach- chair position and prepped and draped in normal sterile fashion.  After the administration of preop IV Kefzol and an appropriate time out, an arthroscopy of the left shoulder was performed through a total of 2 portals.  Findings were as noted above, and procedure consisted of the acromioplasty done with a bur in the lateral position followed by transfer of the posterior position.  We then performed a formal AC resection with the bur in the anterior position.  I debrided the cuff as well as the glenohumeral joint.  The portals were reapproximated loosely with nylon followed by Adaptic, dry gauze, and tape.  Estimated blood loss and fluids can be obtained from anesthesia records.  DISPOSITION:  The patient was extubated in the  operating room and taken to the recovery room in stable condition.  She was to go home same day and follow up in the office closely.  I will contact her by phone tonight.     Monico Blitz Rhona Raider, M.D.     PGD/MEDQ  D:  08/24/2014  T:  08/25/2014  Job:  [id number]

## 2014-09-10 DIAGNOSIS — M19012 Primary osteoarthritis, left shoulder: Secondary | ICD-10-CM | POA: Diagnosis not present

## 2014-09-19 DIAGNOSIS — M8580 Other specified disorders of bone density and structure, unspecified site: Secondary | ICD-10-CM | POA: Diagnosis not present

## 2014-09-19 DIAGNOSIS — Z1231 Encounter for screening mammogram for malignant neoplasm of breast: Secondary | ICD-10-CM | POA: Diagnosis not present

## 2014-09-24 DIAGNOSIS — M25512 Pain in left shoulder: Secondary | ICD-10-CM | POA: Diagnosis not present

## 2014-09-27 DIAGNOSIS — M25512 Pain in left shoulder: Secondary | ICD-10-CM | POA: Diagnosis not present

## 2014-10-01 DIAGNOSIS — Z9889 Other specified postprocedural states: Secondary | ICD-10-CM | POA: Diagnosis not present

## 2014-10-01 DIAGNOSIS — M25512 Pain in left shoulder: Secondary | ICD-10-CM | POA: Diagnosis not present

## 2014-10-04 DIAGNOSIS — M25512 Pain in left shoulder: Secondary | ICD-10-CM | POA: Diagnosis not present

## 2014-10-15 DIAGNOSIS — M25512 Pain in left shoulder: Secondary | ICD-10-CM | POA: Diagnosis not present

## 2014-10-18 DIAGNOSIS — M25512 Pain in left shoulder: Secondary | ICD-10-CM | POA: Diagnosis not present

## 2014-10-23 DIAGNOSIS — M25512 Pain in left shoulder: Secondary | ICD-10-CM | POA: Diagnosis not present

## 2014-10-29 DIAGNOSIS — M25512 Pain in left shoulder: Secondary | ICD-10-CM | POA: Diagnosis not present

## 2014-10-31 DIAGNOSIS — M25512 Pain in left shoulder: Secondary | ICD-10-CM | POA: Diagnosis not present

## 2014-11-05 DIAGNOSIS — M25512 Pain in left shoulder: Secondary | ICD-10-CM | POA: Diagnosis not present

## 2014-11-08 DIAGNOSIS — M25512 Pain in left shoulder: Secondary | ICD-10-CM | POA: Diagnosis not present

## 2014-11-12 DIAGNOSIS — M25512 Pain in left shoulder: Secondary | ICD-10-CM | POA: Diagnosis not present

## 2014-11-14 DIAGNOSIS — M25512 Pain in left shoulder: Secondary | ICD-10-CM | POA: Diagnosis not present

## 2014-11-22 ENCOUNTER — Ambulatory Visit (INDEPENDENT_AMBULATORY_CARE_PROVIDER_SITE_OTHER): Payer: Medicare Other | Admitting: Internal Medicine

## 2014-11-22 VITALS — BP 116/72 | HR 72 | Temp 97.9°F | Resp 16

## 2014-11-22 DIAGNOSIS — J4541 Moderate persistent asthma with (acute) exacerbation: Secondary | ICD-10-CM | POA: Diagnosis not present

## 2014-11-22 DIAGNOSIS — J301 Allergic rhinitis due to pollen: Secondary | ICD-10-CM

## 2014-11-22 DIAGNOSIS — B349 Viral infection, unspecified: Secondary | ICD-10-CM | POA: Diagnosis not present

## 2014-11-22 DIAGNOSIS — J45909 Unspecified asthma, uncomplicated: Secondary | ICD-10-CM | POA: Insufficient documentation

## 2014-11-22 NOTE — Patient Instructions (Signed)
Allergic rhinitis due to pollen  With likely viral syndrome.  Start nasal saline rinses twice a day. Follow with Nasacort 1-2 sprays each nostril daily.  Call us next week if not better.  Asthma  Persistent, currently not well controlled due to viral syndrome. Given Prednisone 10 mg tablets. Take 1 tablet twice a day for 4 days, then 1 tablet on day #5. Continue Symbicort 160 g 2 puffs twice a day along with as needed albuterol. When you are well, you may decrease Symbicort to 2 puffs once a day.

## 2014-11-22 NOTE — Progress Notes (Signed)
11/22/2014  [name] [birth year] [id number]  Referring provider: Josetta Huddle, MD [address] [place], [place] [address]  Chief Complaint: Cough; Wheezing; and Breathing Problem   Susan Li is a 74 y.o. female who is being seen today for follow-up.   HPI Comments: Asthma: Patient had been doing well until a few weeks ago when she developed some mild episodes of shortness of breath where she felt like she could not get a deep breath in. About one week ago she developed a productive cough and hoarseness without any fever. Since then she has had worsening in her breathing. She increased her Symbicort to twice a day but has continued to have shortness of breath.  Allergic rhinitis: Skin testing in the past was positive for weed, mold, cat, dog, dust mite. She has intermittent nasal drainage that is worse in the spring. Last spring she required antibiotics for sinusitis. She was on Nasonex in the past but does not obtain a due to cost.  Acid reflux: Symptoms are well controlled on Zantac and proton pump inhibitor.    ROS: Per HPI unless specifically indicated below Review of Systems   Drug Allergies:  Allergies  Allergen Reactions  . Morphine And Related Itching    Itching nose    Medications:  Current outpatient prescriptions:  .  albuterol (PROAIR HFA) 108 (90 BASE) MCG/ACT inhaler, Inhale 2 puffs into the lungs every 4 (four) hours as needed for wheezing or shortness of breath., Disp: , Rfl:  .  ALPRAZolam (XANAX) 0.5 MG tablet, 0.5 mg at bedtime. , Disp: , Rfl:  .  BIOTIN PO, Take 1,000 mg by mouth daily., Disp: , Rfl:  .  budesonide-formoterol (SYMBICORT) 160-4.5 MCG/ACT inhaler, Inhale 2 puffs into the lungs daily. Has been using twice daily for 4 days, Disp: , Rfl:  .  Calcium Citrate-Vitamin D (CALCIUM + D PO), Take 1,200 mg by mouth daily., Disp: , Rfl:  .  estradiol (ESTRACE) 1 MG tablet, Take 1 mg by mouth daily. Pt takes 0.5 mg, Disp: , Rfl:  .   naproxen sodium (ALEVE) 220 MG tablet, Take 220 mg by mouth 2 (two) times daily with a meal., Disp: , Rfl:  .  omeprazole (PRILOSEC) 20 MG capsule, Take 20 mg by mouth daily., Disp: , Rfl:  .  ranitidine (ZANTAC) 150 MG tablet, Take 150 mg by mouth at bedtime., Disp: , Rfl:  .  triamcinolone (NASACORT) 55 MCG/ACT AERO nasal inhaler, Place 1-2 sprays into the nose daily as needed., Disp: , Rfl:  .  zolpidem (AMBIEN) 10 MG tablet, Take 10 mg by mouth at bedtime as needed for sleep., Disp: , Rfl:  .  escitalopram (LEXAPRO) 10 MG tablet, Take 5 mg by mouth daily., Disp: , Rfl:  .  oxyCODONE (ROXICODONE) 5 MG immediate release tablet, Take 1-2 tablets (5-10 mg total) by mouth every 6 (six) hours as needed for severe pain. (Patient not taking: Reported on 11/22/2014), Disp: 40 tablet, Rfl: 0  Physical Exam: BP 116/72 mmHg  Pulse 72  Temp(Src) 97.9 F (36.6 C) (Oral)  Resp 16  Physical Exam  Constitutional: She appears well-developed and well-nourished. No distress.  HENT:  Right Ear: External ear normal.  Left Ear: External ear normal.  Nose: Nose normal.  Mouth/Throat: Oropharynx is clear and moist.  Eyes: Conjunctivae are normal. Right eye exhibits no discharge. Left eye exhibits no discharge.  Cardiovascular: Normal rate, regular rhythm and normal heart sounds.   No murmur heard.  Pulmonary/Chest: Effort normal and breath sounds normal. No respiratory distress. She has no wheezes. She has no rales.  Abdominal: Soft. Bowel sounds are normal.  Musculoskeletal: She exhibits no edema.  Lymphadenopathy:    She has no cervical adenopathy.  Neurological: She is alert.  Skin: No rash noted.  Vitals reviewed.   Diagnostics:   Spirometry: FEV1 92 %, FEV1/FVC  80%   Spirometry is in the normal range.   Assessment and Plan:  Allergic rhinitis due to pollen  With likely viral syndrome.  Start nasal saline rinses twice a day. Follow with Nasacort 1-2 sprays each nostril daily.  Call us  next week if not better.  Asthma  Persistent, currently not well controlled due to viral syndrome. Given Prednisone 10 mg tablets. Take 1 tablet twice a day for 4 days, then 1 tablet on day #5. Continue Symbicort 160 g 2 puffs twice a day along with as needed albuterol. When you are well, you may decrease Symbicort to 2 puffs once a day.    Return in about 6 months (around 05/22/2015).  Thank you for the opportunity to care for this patient.  Please do not hesitate to contact me with questions.  [facility] [address] [place], [place] [address] [phone]

## 2014-11-22 NOTE — Assessment & Plan Note (Signed)
   Persistent, currently not well controlled due to viral syndrome. Given Prednisone 10 mg tablets. Take 1 tablet twice a day for 4 days, then 1 tablet on day #5. Continue Symbicort 160 g 2 puffs twice a day along with as needed albuterol. When you are well, you may decrease Symbicort to 2 puffs once a day.

## 2014-11-22 NOTE — Assessment & Plan Note (Signed)
   With likely viral syndrome.  Start nasal saline rinses twice a day. Follow with Nasacort 1-2 sprays each nostril daily.  Call us next week if not better.

## 2014-11-28 ENCOUNTER — Ambulatory Visit: Payer: Medicare Other | Admitting: Allergy and Immunology

## 2014-12-03 DIAGNOSIS — J385 Laryngeal spasm: Secondary | ICD-10-CM | POA: Diagnosis not present

## 2014-12-03 DIAGNOSIS — J45909 Unspecified asthma, uncomplicated: Secondary | ICD-10-CM | POA: Diagnosis not present

## 2014-12-03 DIAGNOSIS — R05 Cough: Secondary | ICD-10-CM | POA: Diagnosis not present

## 2014-12-03 DIAGNOSIS — K219 Gastro-esophageal reflux disease without esophagitis: Secondary | ICD-10-CM | POA: Diagnosis not present

## 2014-12-03 DIAGNOSIS — J029 Acute pharyngitis, unspecified: Secondary | ICD-10-CM | POA: Diagnosis not present

## 2014-12-04 DIAGNOSIS — M25512 Pain in left shoulder: Secondary | ICD-10-CM | POA: Diagnosis not present

## 2014-12-12 DIAGNOSIS — M25561 Pain in right knee: Secondary | ICD-10-CM | POA: Diagnosis not present

## 2014-12-13 ENCOUNTER — Other Ambulatory Visit: Payer: Self-pay | Admitting: Neurology

## 2014-12-13 MED ORDER — BUDESONIDE-FORMOTEROL FUMARATE 160-4.5 MCG/ACT IN AERO: 2.0000 | INHALATION_SPRAY | Freq: Two times a day (BID) | RESPIRATORY_TRACT | Status: DC

## 2014-12-25 DIAGNOSIS — J309 Allergic rhinitis, unspecified: Secondary | ICD-10-CM | POA: Diagnosis not present

## 2014-12-25 DIAGNOSIS — K219 Gastro-esophageal reflux disease without esophagitis: Secondary | ICD-10-CM | POA: Diagnosis not present

## 2014-12-25 DIAGNOSIS — R499 Unspecified voice and resonance disorder: Secondary | ICD-10-CM | POA: Diagnosis not present

## 2015-02-08 DIAGNOSIS — R350 Frequency of micturition: Secondary | ICD-10-CM | POA: Diagnosis not present

## 2015-02-08 DIAGNOSIS — M7061 Trochanteric bursitis, right hip: Secondary | ICD-10-CM | POA: Diagnosis not present

## 2015-02-08 DIAGNOSIS — R3915 Urgency of urination: Secondary | ICD-10-CM | POA: Diagnosis not present

## 2015-02-11 DIAGNOSIS — M1611 Unilateral primary osteoarthritis, right hip: Secondary | ICD-10-CM | POA: Diagnosis not present

## 2015-02-11 DIAGNOSIS — M7061 Trochanteric bursitis, right hip: Secondary | ICD-10-CM | POA: Diagnosis not present

## 2015-02-18 ENCOUNTER — Other Ambulatory Visit: Payer: Self-pay | Admitting: Neurology

## 2015-02-18 DIAGNOSIS — M7061 Trochanteric bursitis, right hip: Secondary | ICD-10-CM | POA: Diagnosis not present

## 2015-02-18 MED ORDER — BUDESONIDE-FORMOTEROL FUMARATE 160-4.5 MCG/ACT IN AERO: 2.0000 | INHALATION_SPRAY | Freq: Two times a day (BID) | RESPIRATORY_TRACT | Status: DC

## 2015-02-20 DIAGNOSIS — M7061 Trochanteric bursitis, right hip: Secondary | ICD-10-CM | POA: Diagnosis not present

## 2015-02-25 DIAGNOSIS — M7061 Trochanteric bursitis, right hip: Secondary | ICD-10-CM | POA: Diagnosis not present

## 2015-02-27 DIAGNOSIS — M7061 Trochanteric bursitis, right hip: Secondary | ICD-10-CM | POA: Diagnosis not present

## 2015-03-04 DIAGNOSIS — M7061 Trochanteric bursitis, right hip: Secondary | ICD-10-CM | POA: Diagnosis not present

## 2015-03-06 DIAGNOSIS — M7061 Trochanteric bursitis, right hip: Secondary | ICD-10-CM | POA: Diagnosis not present

## 2015-03-27 DIAGNOSIS — N3944 Nocturnal enuresis: Secondary | ICD-10-CM | POA: Diagnosis not present

## 2015-03-27 DIAGNOSIS — Z0000 Encounter for general adult medical examination without abnormal findings: Secondary | ICD-10-CM | POA: Diagnosis not present

## 2015-03-27 DIAGNOSIS — R350 Frequency of micturition: Secondary | ICD-10-CM | POA: Diagnosis not present

## 2015-03-27 DIAGNOSIS — N8111 Cystocele, midline: Secondary | ICD-10-CM | POA: Diagnosis not present

## 2015-03-27 DIAGNOSIS — N3941 Urge incontinence: Secondary | ICD-10-CM | POA: Diagnosis not present

## 2015-04-03 DIAGNOSIS — M7061 Trochanteric bursitis, right hip: Secondary | ICD-10-CM | POA: Diagnosis not present

## 2015-04-03 DIAGNOSIS — M25561 Pain in right knee: Secondary | ICD-10-CM | POA: Diagnosis not present

## 2015-04-29 DIAGNOSIS — M25561 Pain in right knee: Secondary | ICD-10-CM | POA: Diagnosis not present

## 2015-04-30 DIAGNOSIS — H9313 Tinnitus, bilateral: Secondary | ICD-10-CM | POA: Diagnosis not present

## 2015-04-30 DIAGNOSIS — R5382 Chronic fatigue, unspecified: Secondary | ICD-10-CM | POA: Diagnosis not present

## 2015-04-30 DIAGNOSIS — Z0001 Encounter for general adult medical examination with abnormal findings: Secondary | ICD-10-CM | POA: Diagnosis not present

## 2015-04-30 DIAGNOSIS — B001 Herpesviral vesicular dermatitis: Secondary | ICD-10-CM | POA: Diagnosis not present

## 2015-04-30 DIAGNOSIS — G4700 Insomnia, unspecified: Secondary | ICD-10-CM | POA: Diagnosis not present

## 2015-04-30 DIAGNOSIS — R3915 Urgency of urination: Secondary | ICD-10-CM | POA: Diagnosis not present

## 2015-04-30 DIAGNOSIS — F329 Major depressive disorder, single episode, unspecified: Secondary | ICD-10-CM | POA: Diagnosis not present

## 2015-04-30 DIAGNOSIS — J45909 Unspecified asthma, uncomplicated: Secondary | ICD-10-CM | POA: Diagnosis not present

## 2015-04-30 DIAGNOSIS — E559 Vitamin D deficiency, unspecified: Secondary | ICD-10-CM | POA: Diagnosis not present

## 2015-04-30 DIAGNOSIS — Z79899 Other long term (current) drug therapy: Secondary | ICD-10-CM | POA: Diagnosis not present

## 2015-04-30 DIAGNOSIS — M7061 Trochanteric bursitis, right hip: Secondary | ICD-10-CM | POA: Diagnosis not present

## 2015-04-30 DIAGNOSIS — M75102 Unspecified rotator cuff tear or rupture of left shoulder, not specified as traumatic: Secondary | ICD-10-CM | POA: Diagnosis not present

## 2015-05-01 DIAGNOSIS — N3941 Urge incontinence: Secondary | ICD-10-CM | POA: Diagnosis not present

## 2015-05-01 DIAGNOSIS — R350 Frequency of micturition: Secondary | ICD-10-CM | POA: Diagnosis not present

## 2015-05-01 DIAGNOSIS — N3944 Nocturnal enuresis: Secondary | ICD-10-CM | POA: Diagnosis not present

## 2015-05-01 DIAGNOSIS — Z0000 Encounter for general adult medical examination without abnormal findings: Secondary | ICD-10-CM | POA: Diagnosis not present

## 2015-05-07 ENCOUNTER — Encounter: Payer: Self-pay | Admitting: Allergy and Immunology

## 2015-05-07 ENCOUNTER — Ambulatory Visit (INDEPENDENT_AMBULATORY_CARE_PROVIDER_SITE_OTHER): Payer: Medicare Other | Admitting: Allergy and Immunology

## 2015-05-07 VITALS — BP 132/88 | HR 80 | Resp 16 | Ht 62.8 in | Wt 145.5 lb

## 2015-05-07 DIAGNOSIS — J309 Allergic rhinitis, unspecified: Secondary | ICD-10-CM

## 2015-05-07 DIAGNOSIS — H1010 Acute atopic conjunctivitis, unspecified eye: Secondary | ICD-10-CM | POA: Diagnosis not present

## 2015-05-07 DIAGNOSIS — J387 Other diseases of larynx: Secondary | ICD-10-CM

## 2015-05-07 DIAGNOSIS — J4540 Moderate persistent asthma, uncomplicated: Secondary | ICD-10-CM | POA: Diagnosis not present

## 2015-05-07 DIAGNOSIS — K219 Gastro-esophageal reflux disease without esophagitis: Secondary | ICD-10-CM

## 2015-05-07 MED ORDER — ALBUTEROL SULFATE HFA 108 (90 BASE) MCG/ACT IN AERS: INHALATION_SPRAY | RESPIRATORY_TRACT | Status: DC

## 2015-05-07 NOTE — Progress Notes (Signed)
Follow-up Note  Referring Provider: Josetta Huddle, MD Primary Provider: Henrine Screws, MD Date of Office Visit: 05/07/2015  Subjective:   Susan Li (DOB: 1940/08/03) is a 75 y.o. female who returns to the [facility] on 05/07/2015 in re-evaluation of the following:  HPI: Susan Li returns to this clinic in reevaluation of her asthma, allergic rhinoconjunctivitis, and LPR. Overall she is done quite well since last being seen in this clinic in November 2016. She is not had a flare of her asthma requiring her to get a systemic steroid and she thinks that she has very good control of her asthma while using her Symbicort one time per day. Her requirement for short acting bronchodilator is less than twice a week and she has no nocturnal bronchospastic symptoms and she can exercise by walking without any difficulty at all. Her nose is been doing quite well without any episodes of sinusitis. Her reflux is been doing quite well as has the issue with her throat while using therapy directed against reflux.    Medication List           ALEVE 220 MG tablet  Generic drug:  naproxen sodium  Take 220 mg by mouth daily.     ALPRAZolam 0.5 MG tablet  Commonly known as:  XANAX  0.5 mg at bedtime.     BIOTIN PO  Take 1,000 mg by mouth daily.     budesonide-formoterol 160-4.5 MCG/ACT inhaler  Commonly known as:  SYMBICORT  Inhale 2 puffs into the lungs daily. Has been using twice daily for 4 days     budesonide-formoterol 160-4.5 MCG/ACT inhaler  Commonly known as:  SYMBICORT  Inhale 2 puffs into the lungs 2 (two) times daily.     CALCIUM + D PO  Take 1,200 mg by mouth daily.     estradiol 1 MG tablet  Commonly known as:  ESTRACE  Take 1 mg by mouth daily. Pt takes 0.5 mg     omeprazole 20 MG capsule  Commonly known as:  PRILOSEC  Take 20 mg by mouth daily.     PROAIR HFA 108 (90 Base) MCG/ACT inhaler  Generic drug:  albuterol  Inhale 2 puffs into the lungs every  4 (four) hours as needed for wheezing or shortness of breath.     ranitidine 150 MG tablet  Commonly known as:  ZANTAC  Take 150 mg by mouth at bedtime.     triamcinolone 55 MCG/ACT Aero nasal inhaler  Commonly known as:  NASACORT  Place 1-2 sprays into the nose daily as needed.     zolpidem 10 MG tablet  Commonly known as:  AMBIEN  Take 10 mg by mouth at bedtime as needed for sleep.        Past Medical History  Diagnosis Date  . Asthma     Symbicort as needed 3x week  . GERD (gastroesophageal reflux disease)   . Headache(784.0)   . Fibromyalgia   . Arthritis     hips,backs,hands; osteoarthritis    Past Surgical History  Procedure Laterality Date  . Abdominal hysterectomy  2000  . Buninonectomy  2008  . Lasik  2004  . Cervical disc surgery  2004  . Tendernitis surgery  99  . Colonoscopy with propofol N/A 04/25/2012    Procedure: COLONOSCOPY WITH PROPOFOL;  Surgeon: Garlan Fair, MD;  Location: [facility];  Service: Endoscopy;  Laterality: N/A;  . Shoulder arthroscopy with subacromial decompression Left 08/24/2014  Procedure: ARTHROSCOPY  LEFT SHOULDER, SUBACROMIAL DECOMPRESSION, DISTAL CLAVICLE RESECTION AND DEBRIDEMENT;  Surgeon: Melrose Nakayama, MD;  Location: [facility];  Service: Orthopedics;  Laterality: Left;  . Resection distal clavical Left 08/24/2014    Procedure: RESECTION DISTAL CLAVICAL;  Surgeon: Melrose Nakayama, MD;  Location: [facility];  Service: Orthopedics;  Laterality: Left;    Allergies  Allergen Reactions  . Morphine And Related Itching    Itching nose    Review of systems negative except as noted in HPI / PMHx or noted below:  Review of Systems  Constitutional: Negative.   HENT: Negative.   Eyes: Negative.   Respiratory: Negative.   Cardiovascular: Negative.   Gastrointestinal: Negative.   Genitourinary: Negative.   Musculoskeletal: Negative.   Skin: Negative.   Neurological: Negative.     Endo/Heme/Allergies: Negative.   Psychiatric/Behavioral: Negative.      Objective:   Filed Vitals:   05/07/15 1610  BP: 132/88  Pulse: 80  Resp: 16   Height: 5' 2.8" (159.5 cm)  Weight: 145 lb 8.1 oz (66 kg)   Physical Exam  Constitutional: She is well-developed, well-nourished, and in no distress.  HENT:  Head: Normocephalic.  Right Ear: Tympanic membrane, external ear and ear canal normal.  Left Ear: Tympanic membrane, external ear and ear canal normal.  Nose: Nose normal. No mucosal edema or rhinorrhea.  Mouth/Throat: Uvula is midline, oropharynx is clear and moist and mucous membranes are normal. No oropharyngeal exudate.  Eyes: Conjunctivae are normal.  Neck: Trachea normal. No tracheal tenderness present. No tracheal deviation present. No thyromegaly present.  Cardiovascular: Normal rate, regular rhythm, S1 normal, S2 normal and normal heart sounds.   No murmur heard. Pulmonary/Chest: Breath sounds normal. No stridor. No respiratory distress. She has no wheezes. She has no rales.  Musculoskeletal: She exhibits no edema.  Lymphadenopathy:       Head (right side): No tonsillar adenopathy present.       Head (left side): No tonsillar adenopathy present.    She has no cervical adenopathy.  Neurological: She is alert. Gait normal.  Skin: No rash noted. She is not diaphoretic. No erythema. Nails show no clubbing.  Psychiatric: Mood and affect normal.    Diagnostics:    Spirometry was performed and demonstrated an FEV1 of 2.03 at 98 % of predicted.  The patient had an Asthma Control Test with the following results: ACT Total Score: 15.    Assessment and Plan:   1. Asthma, moderate persistent, well-controlled   2. Allergic rhinoconjunctivitis   3. LPRD (laryngopharyngeal reflux disease)      1. Continue Symbicort 160 one-2 times per day depending on asthma activity  2. Continue omeprazole 20 mg in morning and ranitidine 150 mg in evening  3. Continue Nasacort  or similar one-2 sprays each nostril one time per day  4. Continue antihistamine if needed  5. Continue ProAir HFA 2 puffs every 4-6 hours if needed  6. Return to clinic in 6 months or earlier if problem  Overall, Susan Li has done very well on her current medical therapy. She appears to understand her disease states quite well and understands the appropriate use of medications. We'll keep her on anti-inflammatory medications for her respiratory tract and keep her on therapy for reflux at this point in time and regroup with her in approximately 6 months or earlier if there is a problem.  Allena Katz, MD [place]

## 2015-05-07 NOTE — Patient Instructions (Addendum)
  1. Continue Symbicort 160 one-2 times per day depending on asthma activity  2. Continue omeprazole 20 mg in morning and ranitidine 150 mg in evening  3. Continue Nasacort or similar one-2 sprays each nostril one time per day  4. Continue antihistamine if needed  5. Continue ProAir HFA 2 puffs every 4-6 hours if needed  6. Return to clinic in 6 months or earlier if problem

## 2015-05-20 DIAGNOSIS — N3944 Nocturnal enuresis: Secondary | ICD-10-CM | POA: Diagnosis not present

## 2015-05-20 DIAGNOSIS — N3946 Mixed incontinence: Secondary | ICD-10-CM | POA: Diagnosis not present

## 2015-07-04 DIAGNOSIS — N3941 Urge incontinence: Secondary | ICD-10-CM | POA: Diagnosis not present

## 2015-07-04 DIAGNOSIS — R350 Frequency of micturition: Secondary | ICD-10-CM | POA: Diagnosis not present

## 2015-08-08 DIAGNOSIS — N3941 Urge incontinence: Secondary | ICD-10-CM | POA: Diagnosis not present

## 2015-08-08 DIAGNOSIS — N8111 Cystocele, midline: Secondary | ICD-10-CM | POA: Diagnosis not present

## 2015-08-16 DIAGNOSIS — R32 Unspecified urinary incontinence: Secondary | ICD-10-CM | POA: Diagnosis not present

## 2015-08-16 DIAGNOSIS — R350 Frequency of micturition: Secondary | ICD-10-CM | POA: Diagnosis not present

## 2015-08-16 DIAGNOSIS — R3915 Urgency of urination: Secondary | ICD-10-CM | POA: Diagnosis not present

## 2015-08-16 DIAGNOSIS — F439 Reaction to severe stress, unspecified: Secondary | ICD-10-CM | POA: Diagnosis not present

## 2015-08-21 DIAGNOSIS — R350 Frequency of micturition: Secondary | ICD-10-CM | POA: Diagnosis not present

## 2015-08-21 DIAGNOSIS — N3281 Overactive bladder: Secondary | ICD-10-CM | POA: Diagnosis not present

## 2015-08-21 DIAGNOSIS — N3641 Hypermobility of urethra: Secondary | ICD-10-CM | POA: Diagnosis not present

## 2015-08-21 DIAGNOSIS — N393 Stress incontinence (female) (male): Secondary | ICD-10-CM | POA: Diagnosis not present

## 2015-08-26 DIAGNOSIS — M25561 Pain in right knee: Secondary | ICD-10-CM | POA: Diagnosis not present

## 2015-08-26 DIAGNOSIS — M7061 Trochanteric bursitis, right hip: Secondary | ICD-10-CM | POA: Diagnosis not present

## 2015-09-25 DIAGNOSIS — F419 Anxiety disorder, unspecified: Secondary | ICD-10-CM | POA: Diagnosis not present

## 2015-09-26 ENCOUNTER — Other Ambulatory Visit: Payer: Self-pay | Admitting: *Deleted

## 2015-09-26 MED ORDER — BUDESONIDE-FORMOTEROL FUMARATE 160-4.5 MCG/ACT IN AERO: 2.0000 | INHALATION_SPRAY | Freq: Two times a day (BID) | RESPIRATORY_TRACT | 0 refills | Status: DC

## 2015-10-16 DIAGNOSIS — F419 Anxiety disorder, unspecified: Secondary | ICD-10-CM | POA: Diagnosis not present

## 2015-10-30 DIAGNOSIS — M5440 Lumbago with sciatica, unspecified side: Secondary | ICD-10-CM | POA: Diagnosis not present

## 2015-10-30 DIAGNOSIS — F419 Anxiety disorder, unspecified: Secondary | ICD-10-CM | POA: Diagnosis not present

## 2015-11-04 DIAGNOSIS — M5416 Radiculopathy, lumbar region: Secondary | ICD-10-CM | POA: Diagnosis not present

## 2015-11-04 DIAGNOSIS — M545 Low back pain: Secondary | ICD-10-CM | POA: Diagnosis not present

## 2015-11-06 DIAGNOSIS — M545 Low back pain: Secondary | ICD-10-CM | POA: Diagnosis not present

## 2015-11-06 DIAGNOSIS — M5416 Radiculopathy, lumbar region: Secondary | ICD-10-CM | POA: Diagnosis not present

## 2015-11-07 DIAGNOSIS — J329 Chronic sinusitis, unspecified: Secondary | ICD-10-CM | POA: Diagnosis not present

## 2015-11-13 DIAGNOSIS — M545 Low back pain: Secondary | ICD-10-CM | POA: Diagnosis not present

## 2015-11-13 DIAGNOSIS — M5416 Radiculopathy, lumbar region: Secondary | ICD-10-CM | POA: Diagnosis not present

## 2015-11-14 DIAGNOSIS — M5416 Radiculopathy, lumbar region: Secondary | ICD-10-CM | POA: Diagnosis not present

## 2015-11-14 DIAGNOSIS — M545 Low back pain: Secondary | ICD-10-CM | POA: Diagnosis not present

## 2015-11-20 DIAGNOSIS — M545 Low back pain: Secondary | ICD-10-CM | POA: Diagnosis not present

## 2015-11-20 DIAGNOSIS — M5416 Radiculopathy, lumbar region: Secondary | ICD-10-CM | POA: Diagnosis not present

## 2015-11-21 DIAGNOSIS — M5416 Radiculopathy, lumbar region: Secondary | ICD-10-CM | POA: Diagnosis not present

## 2015-11-21 DIAGNOSIS — M545 Low back pain: Secondary | ICD-10-CM | POA: Diagnosis not present

## 2015-11-25 DIAGNOSIS — M545 Low back pain: Secondary | ICD-10-CM | POA: Diagnosis not present

## 2015-11-25 DIAGNOSIS — M5416 Radiculopathy, lumbar region: Secondary | ICD-10-CM | POA: Diagnosis not present

## 2015-11-28 DIAGNOSIS — M5416 Radiculopathy, lumbar region: Secondary | ICD-10-CM | POA: Diagnosis not present

## 2015-11-28 DIAGNOSIS — M545 Low back pain: Secondary | ICD-10-CM | POA: Diagnosis not present

## 2015-12-02 ENCOUNTER — Telehealth: Payer: Self-pay | Admitting: Allergy and Immunology

## 2015-12-02 ENCOUNTER — Other Ambulatory Visit: Payer: Self-pay | Admitting: *Deleted

## 2015-12-02 MED ORDER — BUDESONIDE-FORMOTEROL FUMARATE 160-4.5 MCG/ACT IN AERO: 2.0000 | INHALATION_SPRAY | Freq: Two times a day (BID) | RESPIRATORY_TRACT | 0 refills | Status: DC

## 2015-12-02 NOTE — Telephone Encounter (Signed)
Pt called and said that [facility] sent over refill for symbicort 160 and has not heard anything. [phone].

## 2015-12-02 NOTE — Telephone Encounter (Signed)
Sent in 1 refill only needs ov for further left message

## 2015-12-27 DIAGNOSIS — M5416 Radiculopathy, lumbar region: Secondary | ICD-10-CM | POA: Diagnosis not present

## 2016-01-03 DIAGNOSIS — G8929 Other chronic pain: Secondary | ICD-10-CM | POA: Diagnosis not present

## 2016-01-03 DIAGNOSIS — M5441 Lumbago with sciatica, right side: Secondary | ICD-10-CM | POA: Diagnosis not present

## 2016-01-08 ENCOUNTER — Other Ambulatory Visit: Payer: Self-pay | Admitting: *Deleted

## 2016-01-08 DIAGNOSIS — G8929 Other chronic pain: Secondary | ICD-10-CM | POA: Diagnosis not present

## 2016-01-08 DIAGNOSIS — M5441 Lumbago with sciatica, right side: Secondary | ICD-10-CM | POA: Diagnosis not present

## 2016-01-08 NOTE — Telephone Encounter (Signed)
Denied SYmbicort needs appt to [facility]

## 2016-01-10 DIAGNOSIS — G8929 Other chronic pain: Secondary | ICD-10-CM | POA: Diagnosis not present

## 2016-01-10 DIAGNOSIS — M5441 Lumbago with sciatica, right side: Secondary | ICD-10-CM | POA: Diagnosis not present

## 2016-02-05 ENCOUNTER — Ambulatory Visit: Payer: Medicare Other | Admitting: Allergy and Immunology

## 2016-02-19 ENCOUNTER — Encounter (INDEPENDENT_AMBULATORY_CARE_PROVIDER_SITE_OTHER): Payer: Self-pay

## 2016-02-19 ENCOUNTER — Encounter: Payer: Self-pay | Admitting: Allergy and Immunology

## 2016-02-19 ENCOUNTER — Ambulatory Visit (INDEPENDENT_AMBULATORY_CARE_PROVIDER_SITE_OTHER): Payer: Medicare Other | Admitting: Allergy and Immunology

## 2016-02-19 VITALS — BP 122/76 | HR 95 | Temp 98.0°F | Resp 18 | Wt 151.6 lb

## 2016-02-19 DIAGNOSIS — J3089 Other allergic rhinitis: Secondary | ICD-10-CM | POA: Diagnosis not present

## 2016-02-19 DIAGNOSIS — K219 Gastro-esophageal reflux disease without esophagitis: Secondary | ICD-10-CM

## 2016-02-19 DIAGNOSIS — J4540 Moderate persistent asthma, uncomplicated: Secondary | ICD-10-CM | POA: Diagnosis not present

## 2016-02-19 DIAGNOSIS — F5101 Primary insomnia: Secondary | ICD-10-CM | POA: Diagnosis not present

## 2016-02-19 NOTE — Progress Notes (Signed)
Follow-up Note  Referring Provider: Josetta Huddle, MD Primary Provider: Henrine Screws, MD Date of Office Visit: 02/19/2016  Subjective:   [name] (DOB: Dec 13, 1940) is a 76 y.o. female who returns to the [facility] on 02/19/2016 in re-evaluation of the following:  HPI: Susan Li presents to this clinic in reevaluation of her asthma and allergic rhinoconjunctivitis and LPR. I last saw her in this clinic in April 2017.  During the interval her asthma has been under excellent control. She has not required a systemic steroid. She rarely uses a short acting bronchodilator. She's currently using her Symbicort one time per day and she thinks that this was working relatively well. Some mornings she has a little bit of a mucous clearing cough that only lasts a few seconds.  Her nose has been doing quite well. It does not sound as though she has required an antibiotic to treat an episode of sinusitis. She does continue on her nasal steroid.  Her throat has been doing quite well. Her reflux has been under control. Currently her plan includes omeprazole and ranitidine in combination which she feels is working quite well.  She's been having significant problems with sleep and to some degree anxiety and this appears to be a direct reflection of her husband's recent diagnosis of ALS. She's taking a combination of Xanax and Ambien at nighttime but this is inadequate and not providing her adequate sleep.  She did receive the flu vaccine this year.  Allergies as of 02/19/2016      Reactions   Morphine And Related Itching   Itching nose      Medication List      albuterol 108 (90 Base) MCG/ACT inhaler Commonly known as:  PROAIR HFA Inhale two puffs every four to six hours as needed for cough or wheeze.   ALEVE 220 MG tablet Generic drug:  naproxen sodium Take 220 mg by mouth daily.   ALPRAZolam 0.5 MG tablet Commonly known as:  XANAX 0.5 mg at bedtime.   BIOTIN  PO Take 1,000 mg by mouth daily.   budesonide-formoterol 160-4.5 MCG/ACT inhaler Commonly known as:  SYMBICORT Inhale 2 puffs into the lungs 2 (two) times daily.   CALCIUM + D PO Take 1,200 mg by mouth daily.   estradiol 1 MG tablet Commonly known as:  ESTRACE Take 1 mg by mouth daily. Pt takes 0.5 mg   omeprazole 20 MG capsule Commonly known as:  PRILOSEC Take 20 mg by mouth daily.   ranitidine 150 MG tablet Commonly known as:  ZANTAC Take 150 mg by mouth at bedtime.   triamcinolone 55 MCG/ACT Aero nasal inhaler Commonly known as:  NASACORT Place 1-2 sprays into the nose daily as needed.   zolpidem 10 MG tablet Commonly known as:  AMBIEN Take 10 mg by mouth at bedtime as needed for sleep.       Past Medical History:  Diagnosis Date  . Arthritis    hips,backs,hands; osteoarthritis  . Asthma    Symbicort as needed 3x week  . Bursitis   . Fibromyalgia   . GERD (gastroesophageal reflux disease)   . [id number]     Past Surgical History:  Procedure Laterality Date  . ABDOMINAL HYSTERECTOMY  2000  . Buninonectomy  2008  . [place] SURGERY  2004  . COLONOSCOPY WITH PROPOFOL N/A 04/25/2012   Procedure: COLONOSCOPY WITH PROPOFOL;  Surgeon: Garlan Fair, MD;  Location: [facility];  Service: Endoscopy;  Laterality: N/A;  .  LASIK  2004  . RESECTION DISTAL CLAVICAL Left 08/24/2014   Procedure: RESECTION DISTAL CLAVICAL;  Surgeon: Melrose Nakayama, MD;  Location: [facility];  Service: Orthopedics;  Laterality: Left;  . SHOULDER ARTHROSCOPY WITH SUBACROMIAL DECOMPRESSION Left 08/24/2014   Procedure: ARTHROSCOPY  LEFT SHOULDER, SUBACROMIAL DECOMPRESSION, DISTAL CLAVICLE RESECTION AND DEBRIDEMENT;  Surgeon: Melrose Nakayama, MD;  Location: [facility];  Service: Orthopedics;  Laterality: Left;  . Tendernitis Surgery  99    Review of systems negative except as noted in HPI / PMHx or noted below:  Review of Systems  Constitutional:  Negative.   HENT: Negative.   Eyes: Negative.   Respiratory: Negative.   Cardiovascular: Negative.   Gastrointestinal: Negative.   Genitourinary: Negative.   Musculoskeletal: Negative.   Skin: Negative.   Neurological: Negative.   Endo/Heme/Allergies: Negative.   Psychiatric/Behavioral: Negative.      Objective:   Vitals:   02/19/16 1030  BP: 122/76  Pulse: 95  Resp: 18  Temp: 98 F (36.7 C)      Weight: 151 lb 9.6 oz (68.8 kg)   Physical Exam  Constitutional: She is well-developed, well-nourished, and in no distress.  HENT:  Head: Normocephalic.  Right Ear: Tympanic membrane, external ear and ear canal normal.  Left Ear: Tympanic membrane, external ear and ear canal normal.  Nose: Nose normal. No mucosal edema or rhinorrhea.  Mouth/Throat: Uvula is midline, oropharynx is clear and moist and mucous membranes are normal. No oropharyngeal exudate.  Eyes: Conjunctivae are normal.  Neck: Trachea normal. No tracheal tenderness present. No tracheal deviation present. No thyromegaly present.  Cardiovascular: Normal rate, regular rhythm, S1 normal, S2 normal and normal heart sounds.   No murmur heard. Pulmonary/Chest: Breath sounds normal. No stridor. No respiratory distress. She has no wheezes. She has no rales.  Musculoskeletal: She exhibits no edema.  Lymphadenopathy:       Head (right side): No tonsillar adenopathy present.       Head (left side): No tonsillar adenopathy present.    She has no cervical adenopathy.  Neurological: She is alert. Gait normal.  Skin: No rash noted. She is not diaphoretic. No erythema. Nails show no clubbing.  Psychiatric: Mood and affect normal.    Diagnostics: None   Assessment and Plan:   1. Asthma, moderate persistent, well-controlled   2. Other allergic rhinitis   3. LPRD (laryngopharyngeal reflux disease)   4. Primary insomnia     1. Continue Symbicort 160 one-2 times per day depending on asthma activity  2. Continue  omeprazole 20 mg in morning and ranitidine 150 mg in evening  3. Continue Nasacort or similar one-2 sprays each nostril one time per day  4. Continue antihistamine if needed  5. Continue ProAir HFA 2 puffs every 4-6 hours if needed  6. Return to clinic in 6 months or earlier if problem  7. Discuss with primary care doctor about using sleep medications  Vincentia appears to be doing well from a respiratory standpoint and from a reflux standpoint and we'll continue to have her use anti-inflammatory agents for her respiratory tract and continue on therapy for reflux. However, her sleep pattern is not particularly good and this is something that she needs to discuss with her primary care doctor as she may be a candidate for some of the recent medication introductions into the field of insomnia therapy. I did inform her that taking a combination of Xanax and Ambien may not be the best way to approach her  insomnia. If she does well I will see her back in this clinic in 6 months or earlier if there is a problem.  Allena Katz, MD Allergy / Immunology [facility]

## 2016-02-19 NOTE — Patient Instructions (Addendum)
  1. Continue Symbicort 160 one-2 times per day depending on asthma activity  2. Continue omeprazole 20 mg in morning and ranitidine 150 mg in evening  3. Continue Nasacort or similar one-2 sprays each nostril one time per day  4. Continue antihistamine if needed  5. Continue ProAir HFA 2 puffs every 4-6 hours if needed  6. Return to clinic in 6 months or earlier if problem  7. Discuss with primary care doctor about using sleep  medications

## 2016-02-22 DIAGNOSIS — M5416 Radiculopathy, lumbar region: Secondary | ICD-10-CM | POA: Diagnosis not present

## 2016-02-29 DIAGNOSIS — M5416 Radiculopathy, lumbar region: Secondary | ICD-10-CM | POA: Diagnosis not present

## 2016-03-02 DIAGNOSIS — G4700 Insomnia, unspecified: Secondary | ICD-10-CM | POA: Diagnosis not present

## 2016-03-02 DIAGNOSIS — Z658 Other specified problems related to psychosocial circumstances: Secondary | ICD-10-CM | POA: Diagnosis not present

## 2016-03-14 DIAGNOSIS — M5136 Other intervertebral disc degeneration, lumbar region: Secondary | ICD-10-CM | POA: Diagnosis not present

## 2016-03-14 DIAGNOSIS — M5416 Radiculopathy, lumbar region: Secondary | ICD-10-CM | POA: Diagnosis not present

## 2016-03-18 ENCOUNTER — Other Ambulatory Visit: Payer: Self-pay

## 2016-03-18 MED ORDER — BUDESONIDE-FORMOTEROL FUMARATE 160-4.5 MCG/ACT IN AERO: INHALATION_SPRAY | RESPIRATORY_TRACT | 5 refills | Status: DC

## 2016-04-01 DIAGNOSIS — M5136 Other intervertebral disc degeneration, lumbar region: Secondary | ICD-10-CM | POA: Diagnosis not present

## 2016-05-08 DIAGNOSIS — H10403 Unspecified chronic conjunctivitis, bilateral: Secondary | ICD-10-CM | POA: Diagnosis not present

## 2016-05-27 DIAGNOSIS — H10413 Chronic giant papillary conjunctivitis, bilateral: Secondary | ICD-10-CM | POA: Diagnosis not present

## 2016-06-18 DIAGNOSIS — H10403 Unspecified chronic conjunctivitis, bilateral: Secondary | ICD-10-CM | POA: Diagnosis not present

## 2016-06-18 DIAGNOSIS — H25813 Combined forms of age-related cataract, bilateral: Secondary | ICD-10-CM | POA: Diagnosis not present

## 2016-06-18 DIAGNOSIS — H02821 Cysts of right upper eyelid: Secondary | ICD-10-CM | POA: Diagnosis not present

## 2016-07-24 DIAGNOSIS — M5412 Radiculopathy, cervical region: Secondary | ICD-10-CM | POA: Diagnosis not present

## 2016-07-24 DIAGNOSIS — M5416 Radiculopathy, lumbar region: Secondary | ICD-10-CM | POA: Diagnosis not present

## 2016-08-26 DIAGNOSIS — G4700 Insomnia, unspecified: Secondary | ICD-10-CM | POA: Diagnosis not present

## 2016-08-26 DIAGNOSIS — Z658 Other specified problems related to psychosocial circumstances: Secondary | ICD-10-CM | POA: Diagnosis not present

## 2016-08-26 DIAGNOSIS — F4321 Adjustment disorder with depressed mood: Secondary | ICD-10-CM | POA: Diagnosis not present

## 2016-09-02 DIAGNOSIS — M542 Cervicalgia: Secondary | ICD-10-CM | POA: Diagnosis not present

## 2016-09-02 DIAGNOSIS — M5136 Other intervertebral disc degeneration, lumbar region: Secondary | ICD-10-CM | POA: Diagnosis not present

## 2016-09-02 DIAGNOSIS — M4316 Spondylolisthesis, lumbar region: Secondary | ICD-10-CM | POA: Diagnosis not present

## 2016-09-16 DIAGNOSIS — H43813 Vitreous degeneration, bilateral: Secondary | ICD-10-CM | POA: Diagnosis not present

## 2016-09-16 DIAGNOSIS — H5213 Myopia, bilateral: Secondary | ICD-10-CM | POA: Diagnosis not present

## 2016-09-16 DIAGNOSIS — H25813 Combined forms of age-related cataract, bilateral: Secondary | ICD-10-CM | POA: Diagnosis not present

## 2016-09-28 ENCOUNTER — Other Ambulatory Visit (HOSPITAL_COMMUNITY): Payer: Self-pay | Admitting: Internal Medicine

## 2016-09-28 DIAGNOSIS — M5412 Radiculopathy, cervical region: Secondary | ICD-10-CM

## 2016-09-29 ENCOUNTER — Other Ambulatory Visit: Payer: Self-pay | Admitting: Internal Medicine

## 2016-10-21 DIAGNOSIS — R202 Paresthesia of skin: Secondary | ICD-10-CM | POA: Diagnosis not present

## 2016-11-13 DIAGNOSIS — Z1231 Encounter for screening mammogram for malignant neoplasm of breast: Secondary | ICD-10-CM | POA: Diagnosis not present

## 2016-11-20 DIAGNOSIS — J4520 Mild intermittent asthma, uncomplicated: Secondary | ICD-10-CM | POA: Diagnosis not present

## 2016-11-20 DIAGNOSIS — J45909 Unspecified asthma, uncomplicated: Secondary | ICD-10-CM | POA: Diagnosis not present

## 2016-11-20 DIAGNOSIS — F4321 Adjustment disorder with depressed mood: Secondary | ICD-10-CM | POA: Diagnosis not present

## 2016-11-20 DIAGNOSIS — F329 Major depressive disorder, single episode, unspecified: Secondary | ICD-10-CM | POA: Diagnosis not present

## 2016-11-25 ENCOUNTER — Encounter: Payer: Self-pay | Admitting: Allergy and Immunology

## 2016-11-25 ENCOUNTER — Ambulatory Visit (INDEPENDENT_AMBULATORY_CARE_PROVIDER_SITE_OTHER): Payer: Medicare Other | Admitting: Allergy and Immunology

## 2016-11-25 VITALS — BP 122/70 | HR 94 | Temp 97.8°F | Resp 17

## 2016-11-25 DIAGNOSIS — J3089 Other allergic rhinitis: Secondary | ICD-10-CM | POA: Diagnosis not present

## 2016-11-25 DIAGNOSIS — J4540 Moderate persistent asthma, uncomplicated: Secondary | ICD-10-CM

## 2016-11-25 DIAGNOSIS — K219 Gastro-esophageal reflux disease without esophagitis: Secondary | ICD-10-CM | POA: Diagnosis not present

## 2016-11-25 MED ORDER — METHYLPREDNISOLONE ACETATE 80 MG/ML IJ SUSP
80.0000 mg | Freq: Once | INTRAMUSCULAR | Status: AC
Start: 2016-11-25 — End: 2016-11-25
  Administered 2016-11-25: 80 mg via INTRAMUSCULAR

## 2016-11-25 MED ORDER — ALBUTEROL SULFATE HFA 108 (90 BASE) MCG/ACT IN AERS: INHALATION_SPRAY | RESPIRATORY_TRACT | 1 refills | Status: DC

## 2016-11-25 NOTE — Progress Notes (Signed)
Follow-up Note  Referring Provider: Josetta Huddle, MD Primary Provider: Josetta Huddle, MD Date of Office Visit: 11/25/2016  Subjective:   Susan Li (DOB: 1940-08-12) is a 76 y.o. female who returns to the [facility] on 11/25/2016 in re-evaluation of the following:  HPI: Susan Li presents to this clinic in reevaluation of her asthma and allergic rhinoconjunctivitis and LPR.  I last saw her in this clinic 19 February 2016.  Overall she has done quite well with her asthma and has not required a systemic steroid to treat an exacerbation but unfortunately 2 weeks ago she developed cough and phlegm and just felt kind of run down without any obvious fever or chills or chest pain and although she is somewhat better regarding this issue she still notes that she has wheezing and coughing.  In addition, she has been having more problems with reflux.  She has burning up into her throat.  She still drinks 2 cups of coffee and a Diet Coke about every other day and has 2 glasses of wine every evening.  She has had very little problems with her nose at this point in time.  She did obtain a flu vaccine this year.  She is going through a grieving process with the death of her husband from [place] Li in May.  Allergies as of 11/25/2016      Reactions   Morphine And Related Itching   Itching nose      Medication List      albuterol 108 (90 Base) MCG/ACT inhaler Commonly known as:  PROAIR HFA Inhale two puffs every four to six hours as needed for cough or wheeze.   ALPRAZolam 0.5 MG tablet Commonly known as:  XANAX 0.5 mg at bedtime.   BIOTIN PO Take 1,000 mg by mouth daily.   budesonide-formoterol 160-4.5 MCG/ACT inhaler Commonly known as:  SYMBICORT Inhale two puffs twice daily to prevent cough or wheeze.  Rinse, gargle, and spit after use.   CALCIUM + D PO Take 1,200 mg by mouth daily.   estradiol 1 MG tablet Commonly known as:  ESTRACE Take 1 mg by mouth daily. Pt takes  0.5 mg   omeprazole 20 MG capsule Commonly known as:  PRILOSEC Take 20 mg by mouth daily.   ranitidine 150 MG tablet Commonly known as:  ZANTAC Take 150 mg by mouth at bedtime.   zolpidem 10 MG tablet Commonly known as:  AMBIEN Take 10 mg by mouth at bedtime as needed for sleep.       Past Medical History:  Diagnosis Date  . Arthritis    hips,backs,hands; osteoarthritis  . Asthma    Symbicort as needed 3x week  . Bursitis   . Fibromyalgia   . GERD (gastroesophageal reflux disease)   . VEHMCNOB(096.2)     Past Surgical History:  Procedure Laterality Date  . ABDOMINAL HYSTERECTOMY  2000  . Buninonectomy  2008  . [place] SURGERY  2004  . COLONOSCOPY WITH PROPOFOL N/A 04/25/2012   Procedure: COLONOSCOPY WITH PROPOFOL;  Surgeon: Garlan Fair, MD;  Location: [facility];  Service: Endoscopy;  Laterality: N/A;  . LASIK  2004  . RESECTION DISTAL CLAVICAL Left 08/24/2014   Procedure: RESECTION DISTAL CLAVICAL;  Surgeon: Melrose Nakayama, MD;  Location: [facility];  Service: Orthopedics;  Laterality: Left;  . SHOULDER ARTHROSCOPY WITH SUBACROMIAL DECOMPRESSION Left 08/24/2014   Procedure: ARTHROSCOPY  LEFT SHOULDER, SUBACROMIAL DECOMPRESSION, DISTAL CLAVICLE RESECTION AND DEBRIDEMENT;  Surgeon: Melrose Nakayama,  MD;  Location: [facility];  Service: Orthopedics;  Laterality: Left;  . Tendernitis Surgery  99    Review of systems negative except as noted in HPI / PMHx or noted below:  Review of Systems  Constitutional: Negative.   HENT: Negative.   Eyes: Negative.   Respiratory: Negative.   Cardiovascular: Negative.   Gastrointestinal: Negative.   Genitourinary: Negative.   Musculoskeletal: Negative.   Skin: Negative.   Neurological: Negative.   Endo/Heme/Allergies: Negative.   Psychiatric/Behavioral: Negative.      Objective:   Vitals:   11/25/16 0940  BP: 122/70  Pulse: 94  Resp: 17  Temp: 97.8 F (36.6 C)  SpO2: 92%            Physical Exam  Constitutional: She is well-developed, well-nourished, and in no distress.  HENT:  Head: Normocephalic.  Right Ear: Tympanic membrane, external ear and ear canal normal.  Left Ear: Tympanic membrane, external ear and ear canal normal.  Nose: Nose normal. No mucosal edema or rhinorrhea.  Mouth/Throat: Uvula is midline, oropharynx is clear and moist and mucous membranes are normal. No oropharyngeal exudate.  Eyes: Conjunctivae are normal.  Neck: Trachea normal. No tracheal tenderness present. No tracheal deviation present. No thyromegaly present.  Cardiovascular: Normal rate, regular rhythm, S1 normal, S2 normal and normal heart sounds.  No murmur heard. Pulmonary/Chest: No stridor. No respiratory distress. She has wheezes (end expiratory wheezing posterior lung field). She has no rales.  Musculoskeletal: She exhibits no edema.  Lymphadenopathy:       Head (right side): No tonsillar adenopathy present.       Head (left side): No tonsillar adenopathy present.    She has no cervical adenopathy.  Neurological: She is alert. Gait normal.  Skin: No rash noted. She is not diaphoretic. No erythema. Nails show no clubbing.  Psychiatric: Mood and affect normal.    Diagnostics:    Spirometry was performed and demonstrated an FEV1 of 1.83 at 92 % of predicted.  The patient had an Asthma Control Test with the following results: ACT Total Score: 20.    Assessment and Plan:   1. Not well controlled moderate persistent asthma   2. Other allergic rhinitis   3. LPRD (laryngopharyngeal reflux disease)     1. Continue Symbicort 160 one-2 times per day depending on asthma activity  2. Continue omeprazole 20 mg in morning and ranitidine 150 mg in evening and attempt to consolidate caffeine and alcohol consumption  3. Continue Nasacort or similar one-2 sprays each nostril one time per day  4. Continue antihistamine if needed  5. Continue ProAir HFA 2 puffs every 4-6 hours  if needed  6.  Depo-Medrol 80 IM delivered in clinic today  7. Return to clinic in 6 months or earlier if problem  Susan Li appears to have an exacerbation of her respiratory inflammatory state and I will treat her with a systemic steroid as noted above and have also made some suggestions about her reflux which appears to be somewhat active.  If she continues to have problems in the face of this therapy she will contact me and will create a different plan to address her issues.  If she does well I will see her in this clinic in 6 months or earlier if there is a problem.  Allena Katz, MD Allergy / Immunology [facility]

## 2016-11-25 NOTE — Patient Instructions (Addendum)
  1. Continue Symbicort 160 one-2 times per day depending on asthma activity  2. Continue omeprazole 20 mg in morning and ranitidine 150 mg in evening and attempt to consolidate caffeine and alcohol consumption  3. Continue Nasacort or similar one-2 sprays each nostril one time per day  4. Continue antihistamine if needed  5. Continue ProAir HFA 2 puffs every 4-6 hours if needed  6.  Depo-Medrol 80 IM delivered in clinic today  7. Return to clinic in 6 months or earlier if problem

## 2016-11-26 ENCOUNTER — Encounter: Payer: Self-pay | Admitting: Allergy and Immunology

## 2016-12-01 DIAGNOSIS — J45909 Unspecified asthma, uncomplicated: Secondary | ICD-10-CM | POA: Diagnosis not present

## 2016-12-01 DIAGNOSIS — R350 Frequency of micturition: Secondary | ICD-10-CM | POA: Diagnosis not present

## 2016-12-01 DIAGNOSIS — F329 Major depressive disorder, single episode, unspecified: Secondary | ICD-10-CM | POA: Diagnosis not present

## 2016-12-01 DIAGNOSIS — E559 Vitamin D deficiency, unspecified: Secondary | ICD-10-CM | POA: Diagnosis not present

## 2016-12-01 DIAGNOSIS — R202 Paresthesia of skin: Secondary | ICD-10-CM | POA: Diagnosis not present

## 2016-12-01 DIAGNOSIS — Z79899 Other long term (current) drug therapy: Secondary | ICD-10-CM | POA: Diagnosis not present

## 2016-12-01 DIAGNOSIS — Z0000 Encounter for general adult medical examination without abnormal findings: Secondary | ICD-10-CM | POA: Diagnosis not present

## 2016-12-01 DIAGNOSIS — R5382 Chronic fatigue, unspecified: Secondary | ICD-10-CM | POA: Diagnosis not present

## 2016-12-01 DIAGNOSIS — K219 Gastro-esophageal reflux disease without esophagitis: Secondary | ICD-10-CM | POA: Diagnosis not present

## 2016-12-01 DIAGNOSIS — G4700 Insomnia, unspecified: Secondary | ICD-10-CM | POA: Diagnosis not present

## 2016-12-01 DIAGNOSIS — Z1389 Encounter for screening for other disorder: Secondary | ICD-10-CM | POA: Diagnosis not present

## 2016-12-01 DIAGNOSIS — M5431 Sciatica, right side: Secondary | ICD-10-CM | POA: Diagnosis not present

## 2016-12-18 ENCOUNTER — Encounter: Payer: Self-pay | Admitting: Neurology

## 2016-12-18 ENCOUNTER — Ambulatory Visit (INDEPENDENT_AMBULATORY_CARE_PROVIDER_SITE_OTHER): Payer: Medicare Other | Admitting: Neurology

## 2016-12-18 VITALS — BP 122/73 | HR 78 | Ht 63.0 in | Wt 144.8 lb

## 2016-12-18 DIAGNOSIS — R202 Paresthesia of skin: Secondary | ICD-10-CM | POA: Diagnosis not present

## 2016-12-18 DIAGNOSIS — R29898 Other symptoms and signs involving the musculoskeletal system: Secondary | ICD-10-CM | POA: Diagnosis not present

## 2016-12-18 DIAGNOSIS — M5412 Radiculopathy, cervical region: Secondary | ICD-10-CM

## 2016-12-18 NOTE — Patient Instructions (Signed)
MRI cervical spine EMG/NCS   Cervical Radiculopathy Cervical radiculopathy happens when a nerve in the neck (cervical nerve) is pinched or bruised. This condition can develop because of an injury or as part of the normal aging process. Pressure on the cervical nerves can cause pain or numbness that runs from the neck all the way down into the arm and fingers. Usually, this condition gets better with rest. Treatment may be needed if the condition does not improve. What are the causes? This condition may be caused by:  Injury.  Slipped (herniated) disk.  Muscle tightness in the neck because of overuse.  Arthritis.  Breakdown or degeneration in the bones and joints of the spine (spondylosis) due to aging.  Bone spurs that may develop near the cervical nerves.  What are the signs or symptoms? Symptoms of this condition include:  Pain that runs from the neck to the arm and hand. The pain can be severe or irritating. It may be worse when the neck is moved.  Numbness or weakness in the affected arm and hand.  How is this diagnosed? This condition may be diagnosed based on symptoms, medical history, and a physical exam. You may also have tests, including:  X-rays.  CT scan.  MRI.  Electromyogram (EMG).  Nerve conduction tests.  How is this treated? In many cases, treatment is not needed for this condition. With rest, the condition usually gets better over time. If treatment is needed, options may include:  Wearing a soft neck collar for short periods of time.  Physical therapy to strengthen your neck muscles.  Medicines, such as NSAIDs, oral corticosteroids, or spinal injections.  Surgery. This may be needed if other treatments do not help. Various types of surgery may be done depending on the cause of your problems.  Follow these instructions at home: Managing pain  Take over-the-counter and prescription medicines only as told by your health care provider.  If  directed, apply ice to the affected area. ? Put ice in a plastic bag. ? Place a towel between your skin and the bag. ? Leave the ice on for 20 minutes, 2-3 times per day.  If ice does not help, you can try using heat. Take a warm shower or warm bath, or use a heat pack as told by your health care provider.  Try a gentle neck and shoulder massage to help relieve symptoms. Activity  Rest as needed. Follow instructions from your health care provider about any restrictions on activities.  Do stretching and strengthening exercises as told by your health care provider or physical therapist. General instructions  If you were given a soft collar, wear it as told by your health care provider.  Use a flat pillow when you sleep.  Keep all follow-up visits as told by your health care provider. This is important. Contact a health care provider if:  Your condition does not improve with treatment. Get help right away if:  Your pain gets much worse and cannot be controlled with medicines.  You have weakness or numbness in your hand, arm, face, or leg.  You have a high fever.  You have a stiff, rigid neck.  You lose control of your bowels or your bladder (have incontinence).  You have trouble with walking, balance, or speaking. This information is not intended to replace advice given to you by your health care provider. Make sure you discuss any questions you have with your health care provider. Document Released: 09/23/2000 Document Revised: 06/06/2015 Document  Reviewed: 02/22/2014 Elsevier Interactive Patient Education  [employer].

## 2016-12-18 NOTE — Progress Notes (Signed)
[facility]    Provider:  Dr Jaynee Eagles Referring Provider: Josetta Huddle, MD Primary Care Physician:  Josetta Huddle, MD  CC:  Paresthesias of left arm  HPI:  Susan Li is a 76 y.o. female here as a referral from Dr. Inda Merlin for paresthesias of left arm. PMHx Sciatica, insomnia, Fibromyalgia, Asthma, Arthritis, cervical degenerative disease. Her husband died of ALS recently and she has had a difficult time. She has tingling from her shoulder to her left thumb. She also has weakness in her left arm. Started a year ago. The left hand is weak when she picks things up. Slowly worsening. She had surgery on her shoulder. She sees Dr. Nelva Bush for back pain and steroid injections. No recent MRI cervical spine. Steroid injections in the neck and shoulder may have helped. No triggers, nothing makes it better. Daily tingling and progressive weakness left arm. Right is unaffected. No other focal neurologic deficits, associated symptoms, inciting events or modifiable factors.   Reviewed notes, labs and imaging from outside physicians, which showed:  TSH WNL CBC/CMP unremarkable with cr 0.66 and bun 18 12/01/2016   MRI cervical spine 01/2012: personally reviewed and agree with findings below. Findings: The craniocervical junction appears unremarkable.  Plate and screw fixator at C5 - C6-C7 causes some local distortion. Mild kyphotic angulation noted at C4-5 with suspicion for mild posterior degenerative subluxation at the C4-5 level.  No significant abnormal cord signal is identified.  Degenerative spurring noted eccentric to the right of the C1-2 articulation.  Degenerative endplate changes and possible Schmorl's node noted along the inferior endplate of C4. No significant abnormal enhancement noted.  Additional findings at individual levels are as follows:  C2-3: No impingement.  There is 1 mm of anterior subluxation of C2 on C3.  Stable small central disc  protrusion.  C3-4:  No impingement.  Central annular tear.  C4-5:  Mild anterior flattening of the cord due to disc osteophyte complex, although there is adequate CSF space posterior to the cord.  Borderline foraminal stenosis.  C5-6:  Borderline left foraminal stenosis due to facet and intervertebral spurring.  C6-7:  No impingement.  Small bilateral perineural cysts.  C7-T1:  Borderline left foraminal stenosis due to facet arthropathy.  Stable 2 mm anterolisthesis, believed to be degenerative.  Right perineural cyst.  T1-2:  Stable appearance of somewhat prominent left perineural cyst, only partially included on today's exam.  IMPRESSION:  1.  There is mild anterior flattening of the cord at C4-5 due to disc osteophyte complex, although there is CSF space posterior to the cord at this level. 2.  Borderline foraminal impingement at C4-5, C5-6, and C7-T1. 3.  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  Perineural cysts are only rarely a cause for symptoms.   Review of Systems: Patient complains of symptoms per HPI as well as the following symptoms" left arm tingling. Pertinent negatives and positives per HPI. All others negative.   Social History   Socioeconomic History  . Marital status: Married    Spouse name: Not on file  . Number of children: Not on file  . Years of education: Not on file  . Highest education level: Not on file  Social Needs  . Financial resource strain: Not on file  . Food insecurity - worry: Not on file  . Food insecurity - inability: Not on file  . Transportation needs - medical: Not on file  . Transportation needs -  non-medical: Not on file  Occupational History  . Not on file  Tobacco Use  . Smoking status: Never Smoker  . Smokeless tobacco: Never Used  Substance and Sexual Activity  . Alcohol use: Yes    Alcohol/week: 1.2 oz    Types: 2 Glasses of wine per week  . Drug  use: No  . Sexual activity: Not on file  Other Topics Concern  . Not on file  Social History Narrative  . Not on file    Family History  Problem Relation Age of Onset  . Neuromuscular disorder Neg Hx     Past Medical History:  Diagnosis Date  . Arthritis    hips,backs,hands; osteoarthritis  . Asthma    Symbicort as needed 3x week  . Bursitis   . Fibromyalgia   . GERD (gastroesophageal reflux disease)   . Insomnia   . Sciatica of right side   . Vitamin D deficiency     Past Surgical History:  Procedure Laterality Date  . ABDOMINAL HYSTERECTOMY  2000  . Buninonectomy  2008  . [place] SURGERY  2004  . COLONOSCOPY WITH PROPOFOL N/A 04/25/2012   Procedure: COLONOSCOPY WITH PROPOFOL;  Surgeon: Garlan Fair, MD;  Location: [facility];  Service: Endoscopy;  Laterality: N/A;  . LASIK  2004  . left shoulder surgery    . RESECTION DISTAL CLAVICAL Left 08/24/2014   Procedure: RESECTION DISTAL CLAVICAL;  Surgeon: Melrose Nakayama, MD;  Location: [facility];  Service: Orthopedics;  Laterality: Left;  . SHOULDER ARTHROSCOPY WITH SUBACROMIAL DECOMPRESSION Left 08/24/2014   Procedure: ARTHROSCOPY  LEFT SHOULDER, SUBACROMIAL DECOMPRESSION, DISTAL CLAVICLE RESECTION AND DEBRIDEMENT;  Surgeon: Melrose Nakayama, MD;  Location: [facility];  Service: Orthopedics;  Laterality: Left;  . Tendernitis Surgery  99    Current Outpatient Medications  Medication Sig Dispense Refill  . albuterol (PROAIR HFA) 108 (90 Base) MCG/ACT inhaler Inhale two puffs every four to six hours as needed for cough or wheeze. 1 Inhaler 1  . ALPRAZolam (XANAX) 0.5 MG tablet 0.25 mg at bedtime.     Marland Kitchen BIOTIN PO Take 5,000 mg by mouth daily.     . budesonide-formoterol (SYMBICORT) 160-4.5 MCG/ACT inhaler Inhale two puffs twice daily to prevent cough or wheeze.  Rinse, gargle, and spit after use. 1 Inhaler 5  . Calcium Citrate-Vitamin D (CALCIUM + D PO) Take 1,200 mg by mouth daily.    .  cetirizine (ZYRTEC) 5 MG tablet Take 5 mg by mouth daily.    Marland Kitchen escitalopram (LEXAPRO) 10 MG tablet     . estradiol (ESTRACE) 1 MG tablet Take 1 mg by mouth daily. Pt takes 0.5 mg    . Fexofenadine HCl (MUCINEX ALLERGY PO) Take by mouth.    Marland Kitchen omeprazole (PRILOSEC) 20 MG capsule Take 20 mg by mouth daily.    Marland Kitchen oxybutynin (DITROPAN) 5 MG tablet Take 5 mg by mouth.    . ranitidine (ZANTAC) 150 MG tablet Take 150 mg by mouth at bedtime.    Marland Kitchen zolpidem (AMBIEN) 10 MG tablet Take 10 mg by mouth at bedtime as needed for sleep.     No current facility-administered medications for this visit.     Allergies as of 12/18/2016 - Review Complete 12/18/2016  Allergen Reaction Noted  . Morphine and related Itching 04/18/2012    Vitals: BP 122/73 (BP Location: Right Arm, Patient Position: Sitting, Cuff Size: Small)   Pulse 78   Ht 5\' 3"  (1.6 m)   Wt  144 lb 12.8 oz (65.7 kg)   BMI 25.65 kg/m  Last Weight:  Wt Readings from Last 1 Encounters:  12/18/16 144 lb 12.8 oz (65.7 kg)   Last Height:   Ht Readings from Last 1 Encounters:  12/18/16 5\' 3"  (1.6 m)    Physical exam: Exam: Gen: NAD, conversant, well nourised, well groomed                     CV: RRR, no MRG. No Carotid Bruits. No peripheral edema, warm, nontender Eyes: Conjunctivae clear without exudates or hemorrhage  Neuro: Detailed Neurologic Exam  Speech:    Speech is normal; fluent and spontaneous with normal comprehension.  Cognition:    The patient is oriented to person, place, and time;     recent and remote memory intact;     language fluent;     normal attention, concentration,     fund of knowledge Cranial Nerves:    The pupils are equal, round, and reactive to light. The fundi are normal and spontaneous venous pulsations are present. Visual fields are full to finger confrontation. Extraocular movements are intact. Trigeminal sensation is intact and the muscles of mastication are normal. The face is symmetric. The palate  elevates in the midline. Hearing intact. Voice is normal. Shoulder shrug is normal. The tongue has normal motion without fasciculations.   Coordination:    Normal finger to nose and heel to shin. Normal rapid alternating movements.   Gait:    Heel-toe and tandem gait are normal.   Motor Observation:  No asymmetry, no atrophy, and no involuntary movements noted. Tone:    Normal muscle tone.    Posture:    Posture is normal. normal erect    Strength:left arm proximal weakness otherwise strength is V/V in the upper and lower limbs.      Sensation: intact to LT     Reflex Exam:  DTR's:    Deep tendon reflexes in the upper and lower extremities are normal bilaterally.   Toes:    The toes are downgoing bilaterally.   Clonus:    Clonus is absent.  Neg Phalens and Neg Tinels at the wrists     Assessment/Plan:  76 year old with degenerative cervical disk disease with left arm numbness, radicular symptoms. Need MRI cervical spine to assess for nerve impingement and surgical intervention or pain procedures.    MRI cervical spine EMG/NCS  Orders Placed This Encounter  Procedures  . MR CERVICAL SPINE WO CONTRAST  . NCV with EMG(electromyography)    Cc: Dr Inda Merlin Sarina Ill, MD  [facility] [address] [place], [place] [address]  Phone [phone] Fax [phone]

## 2016-12-24 ENCOUNTER — Ambulatory Visit
Admission: RE | Admit: 2016-12-24 | Discharge: 2016-12-24 | Disposition: A | Discharge status: Home / Self Care | Payer: Medicare Other | Source: Ambulatory Visit | Attending: Allergy | Admitting: Allergy

## 2016-12-24 ENCOUNTER — Encounter: Payer: Self-pay | Admitting: Allergy

## 2016-12-24 ENCOUNTER — Ambulatory Visit (INDEPENDENT_AMBULATORY_CARE_PROVIDER_SITE_OTHER): Payer: Medicare Other | Admitting: Allergy

## 2016-12-24 VITALS — BP 110/75 | HR 75 | Temp 98.7°F | Resp 16

## 2016-12-24 DIAGNOSIS — R05 Cough: Secondary | ICD-10-CM | POA: Diagnosis not present

## 2016-12-24 DIAGNOSIS — J3089 Other allergic rhinitis: Secondary | ICD-10-CM

## 2016-12-24 DIAGNOSIS — K219 Gastro-esophageal reflux disease without esophagitis: Secondary | ICD-10-CM | POA: Diagnosis not present

## 2016-12-24 DIAGNOSIS — J4540 Moderate persistent asthma, uncomplicated: Secondary | ICD-10-CM | POA: Diagnosis not present

## 2016-12-24 NOTE — Progress Notes (Signed)
Follow-up Note  RE: Susan Li MRN: [id number] DOB: [birth year] Date of Office Visit: 12/24/2016   History of present illness: Susan Li is a 76 y.o. female presenting today for sick visit.  She was last seen in the office on 11/25/16 by Dr. Neldon Mc for asthma exacerbation.  She was treated at this time with depomedrol injection.  She states she felt somewhat better after this but symptoms returned.  She reports having cough sometimes with productive phlegm, chest tightness, wheezing.  She states these symptoms were similar to symptoms she had at least visit and has been ongoing since October and she has not been well since this time.  She is using albuterol at least twice a day with temporary relief of symptoms.  She takes symbicort 2 puffs twice a day and states has been on this for years.  She states she has not had symptoms like this in quite some time.  She denies any fevers but does report sick contacts over the past 2 months.  She does report some increased nasal drainage and reports throat clearing through the day.  She doesn't report any significant reflux symptoms and does take omeprazole in AM and ranitidine in the PM and is trying to reduce caffeine/alcohol consumption.      Review of systems: Review of Systems  Constitutional: Negative for chills, fever and malaise/fatigue.  HENT: Positive for congestion. Negative for ear discharge, ear pain, nosebleeds, sinus pain, sore throat and tinnitus.   Eyes: Negative for pain, discharge and redness.  Respiratory: Positive for cough, sputum production and wheezing. Negative for hemoptysis.   Cardiovascular: Negative for chest pain.  Gastrointestinal: Negative for abdominal pain, constipation, diarrhea, heartburn, nausea and vomiting.  Musculoskeletal: Negative for joint pain and myalgias.  Skin: Negative for itching and rash.  Neurological: Negative for dizziness and headaches.    All other systems negative unless noted above in  HPI  Past medical/social/surgical/family history have been reviewed and are unchanged unless specifically indicated below.  No changes  Medication List: Allergies as of 12/24/2016      Reactions   Morphine And Related Itching   Itching nose      Medication List        Accurate as of 12/24/16  2:24 PM. Always use your most recent med list.          albuterol 108 (90 Base) MCG/ACT inhaler Commonly known as:  PROAIR HFA Inhale two puffs every four to six hours as needed for cough or wheeze.   ALPRAZolam 0.5 MG tablet Commonly known as:  XANAX 0.25 mg at bedtime.   BIOTIN PO Take 5,000 mg by mouth daily.   budesonide-formoterol 160-4.5 MCG/ACT inhaler Commonly known as:  SYMBICORT Inhale two puffs twice daily to prevent cough or wheeze.  Rinse, gargle, and spit after use.   CALCIUM + D PO Take 1,200 mg by mouth daily.   cetirizine 5 MG tablet Commonly known as:  ZYRTEC Take 5 mg by mouth daily.   escitalopram 10 MG tablet Commonly known as:  LEXAPRO   estradiol 1 MG tablet Commonly known as:  ESTRACE Take 1 mg by mouth daily. Pt takes 0.5 mg   MUCINEX ALLERGY PO Take by mouth.   omeprazole 20 MG capsule Commonly known as:  PRILOSEC Take 20 mg by mouth daily.   oxybutynin 5 MG tablet Commonly known as:  DITROPAN Take 5 mg by mouth.   ranitidine 150 MG tablet Commonly known as:  ZANTAC Take  150 mg by mouth at bedtime.   zolpidem 10 MG tablet Commonly known as:  AMBIEN Take 10 mg by mouth at bedtime as needed for sleep.       Known medication allergies: Allergies  Allergen Reactions  . Morphine And Related Itching    Itching nose     Physical examination: Blood pressure 110/75, pulse 75, temperature 98.7 F (37.1 C), temperature source Oral, resp. rate 16, SpO2 98 %.  General: Alert, interactive, in no acute distress. HEENT: PERRLA, TMs pearly gray, turbinates mildly edematous with clear discharge, post-pharynx non  erythematous. Neck: Supple without lymphadenopathy. Lungs: Mildly decreased breath sounds bilaterally without wheezing or rales, however slight rhonchi. {no increased work of breathing. CV: Normal S1, S2 without murmurs. Abdomen: Nondistended, nontender. Skin: Warm and dry, without lesions or rashes. Extremities:  No clubbing, cyanosis or edema. Neuro:   Grossly intact.  Diagnositics/Labs:   Spirometry: FEV1: 2.00L  100%, FVC: 2.61L  97%, ratio consistent with nonobstructive pattern  Assessment and plan: Mod persistent asthma, not well controlled  - she has similar symptoms as previous visit with increased albuterol use.  Will obtain CXR to rule out PNA or other underlying issues given slight rhonchi h eard on exam.  Will have her add asmanex to her symbicort during illness.   Allergic rhinitis LPRD  1. Continue Symbicort 160 mcg  2 puffs twice a day      During asthma flares/respiratory illnesses add on 2 puffs twice a day of Asmanex 100 mcg  2.  If symptoms not improved by Sunday take prednisone pack as outlined on pack  3. Will obtain CXR  4. Continue omeprazole 20 mg in morning and ranitidine 150 mg in evening and attempt to consolidate caffeine and alcohol consumption  5.  Provided with Dymista sample -- this is a combination nasal spray with Flonase and Astelin (a nasal antihistamine for nasal drainage/post-nasal drip).   While on Dymista hold your Nasacort.   We will send in prescription for Astelin once Dymista sample runs out.    6. Continue OTC Zyrtec daily   7. Continue ProAir HFA 2 puffs every 4-6 hours if needed  8. Return to clinic in 6 months or earlier if problem   I appreciate the opportunity to take part in [facility] care. Please do not hesitate to contact me with questions.  Sincerely,   Prudy Feeler, MD Allergy/Immunology [facility]

## 2016-12-24 NOTE — Patient Instructions (Signed)
  1. Continue Symbicort 160 mcg  2 puffs twice a day      During asthma flares/respiratory illnesses add on 2 puffs twice a day of Asmanex 100 mcg  2.  If symptoms not improved by Sunday take prednisone pack as outlined on pack  3. Will obtain CXR  4. Continue omeprazole 20 mg in morning and ranitidine 150 mg in evening and attempt to consolidate caffeine and alcohol consumption  5.  Provided with Dymista sample -- this is a combination nasal spray with Flonase and Astelin (a nasal antihistamine for nasal drainage/post-nasal drip).   While on Dymista hold your Nasacort.   We will send in prescription for Astelin once Dymista sample runs out.    6. Continue OTC Zyrtec daily   7. Continue ProAir HFA 2 puffs every 4-6 hours if needed  8. Return to clinic in 6 months or earlier if problem

## 2016-12-25 NOTE — Progress Notes (Signed)
Pt advised of unremarkable CXR results.

## 2016-12-30 ENCOUNTER — Ambulatory Visit (INDEPENDENT_AMBULATORY_CARE_PROVIDER_SITE_OTHER): Payer: Medicare Other | Admitting: Family Medicine

## 2016-12-30 ENCOUNTER — Encounter: Payer: Self-pay | Admitting: Family Medicine

## 2016-12-30 ENCOUNTER — Telehealth: Payer: Self-pay | Admitting: Allergy

## 2016-12-30 VITALS — BP 130/76 | HR 61 | Temp 97.7°F

## 2016-12-30 DIAGNOSIS — J4541 Moderate persistent asthma with (acute) exacerbation: Secondary | ICD-10-CM

## 2016-12-30 DIAGNOSIS — J3089 Other allergic rhinitis: Secondary | ICD-10-CM | POA: Diagnosis not present

## 2016-12-30 DIAGNOSIS — J019 Acute sinusitis, unspecified: Secondary | ICD-10-CM | POA: Insufficient documentation

## 2016-12-30 DIAGNOSIS — J0100 Acute maxillary sinusitis, unspecified: Secondary | ICD-10-CM

## 2016-12-30 DIAGNOSIS — K219 Gastro-esophageal reflux disease without esophagitis: Secondary | ICD-10-CM

## 2016-12-30 MED ORDER — METHYLPREDNISOLONE ACETATE 80 MG/ML IJ SUSP
80.0000 mg | Freq: Once | INTRAMUSCULAR | Status: AC
  Administered 2016-12-30: 80 mg via INTRAMUSCULAR

## 2016-12-30 MED ORDER — AZITHROMYCIN 250 MG PO TABS: ORAL_TABLET | ORAL | 0 refills | Status: DC

## 2016-12-30 MED ORDER — GUAIFENESIN ER 600 MG PO TB12: 1200.0000 mg | ORAL_TABLET | Freq: Two times a day (BID) | ORAL | Status: AC

## 2016-12-30 NOTE — Telephone Encounter (Signed)
Patient was seen and given prednisone Patient doesn't feel she is getting any better in her condition Is there anything else that can be done??

## 2016-12-30 NOTE — Progress Notes (Signed)
[place] [address] Dept: [phone]  FAMILY NURSE PRACTITIONER FOLLOW UP NOTE  Patient ID: Susan Li, female    DOB: March 24, 1940  Age: 76 y.o. MRN: [id number] Date of Office Visit: 12/30/2016  Assessment  Chief Complaint: Breathing Problem (Pt presents with unresolved shortness of breath)  HPI Susan Li is a 76 year old female patient to the clinic today for a sick visit. She was first seen in this office on 11/25/2016 by Dr. Neldon Mc for an asthma exacerbation requiring a DepoMedrol injection. She reports that she felt somewhat better after the injection but the symptoms returned shortly afterward.  She was next seen in this clinic 12/24/2016 by Dr. Nelva Bush for evaluation of cough, chest tightness, phlegm, and wheezing. At that time she added Asmanex, prednisone, and Dymsta to her regular regimen. She also obtained a chest x-ray that indicated her lungs were clear and there was no active cardiopulmonary disease.   At today's visit, Susan Li reports her symptoms as wheezing and shortness of breath with rest and activity and especially when lying down, cough that rattles in her chest and "gets stuck in her throat", increase in thick nasal drainage, headache over her eyes, and an increase in heartburn. She reports she was compliant with the therapy recommended last week but is feeling worse. She denies fever but she has been exposed to sick contacts lately.   Susan Li reports an increase in alcohol intake over the last 7 months since her husband has past on from [place]. She reports that it helps her relax and forget. She is also reporting an increase in her symptoms of GERD. She does not currently attend a support group but does report a strong support system of friends and family.   Drug Allergies:  Allergies  Allergen Reactions  . Morphine And Related Itching    Itching nose    Physical Exam: BP 130/76 (BP Location: Left Arm, Patient Position: Sitting, Cuff Size:  Normal)   Pulse 61   Temp 97.7 F (36.5 C)   SpO2 96%    Physical Exam  Constitutional: She is oriented to person, place, and time. She appears well-developed and well-nourished.  HENT:  Bilateral ears normal. Eyes normal. Bilateral nares erythematous and edematous no discharge noted. Pain to palpation under both eyes.  Eyes: Conjunctivae are normal.  Neck: Normal range of motion. Neck supple.  Cardiovascular: Normal rate and regular rhythm.  S1S2 normal. Regular rate and rhythm. Murmur noted.   Pulmonary/Chest: Effort normal. She exhibits no tenderness.  Rhonchi that would not clear with coughing noted throughout. Scattered expiratory wheezing noted throughout.   Musculoskeletal: Normal range of motion. She exhibits no edema.  Neurological: She is alert and oriented to person, place, and time.  Skin: Skin is warm and dry.  Psychiatric: She has a normal mood and affect. Her behavior is normal. Judgment and thought content normal.    Diagnostics: FEV1 1.15, FVC 1.32.  Predicted FEV1 1.99, predicted FVC 2.59.  Spirometry indicates moderate restriction.  Assessment and Plan: 1. Moderate persistent asthma with acute exacerbation   2. Other allergic rhinitis   3. LPRD (laryngopharyngeal reflux disease)   4. Acute non-recurrent maxillary sinusitis     Meds ordered this encounter  Medications  . azithromycin (ZITHROMAX) 250 MG tablet    Sig: Take 2 tablets (500 mg) on the first day, then take 1 tablet (250 mg) for the next 4 days    Dispense:  6 each    Refill:  0  .  methylPREDNISolone acetate (DEPO-MEDROL) injection 80 mg  . guaiFENesin (MUCINEX) 12 hr tablet 1,200 mg    1. Continue Symbicort 160 mcg  2 puffs twice a day      During asthma flares/respiratory illnesses add on 2 puffs twice a day of Asmanex 100 mcg  2. DepoMedrol 80 mg IM in the office  3. Begin Azithromycin 500 mg on day 1, then 250 mg for  4 more days.   4. Continue omeprazole 20 mg in morning. Increase  ranitidine to 150 mg twice a day. Attempt to consolidate caffeine and alcohol consumption  5. Begin Mucinex 1200 mg twice a day.  Increase fluid intake as tolerated  6. Continue OTC Zyrtec daily   7. Continue ProAir HFA 2 puffs every 4-6 hours if needed  8. Return to clinic in 2 months or earlier if problem   Return in about 2 months (around 03/02/2017), or if symptoms worsen or fail to improve.   With her pattern of improvement followed by worsening of her symptoms, Susan Li appears to have a combination of airway inflammation and bacterial infection which is further complicated by increased alcohol intake and GERD. I have given her a DepoMedrol injection in the office, antibiotic, and increased her H2 blocker as well as encouraged her cessation of alcohol and encouraged her to participate in a support group. I will see her back in the office in 2 months or sooner if needed.  Thank you for the opportunity to care for this patient.  Please do not hesitate to contact me with questions.  Gareth Morgan, FNP [facility] [place] [facility]  I have provided oversight concerning Gareth Morgan' evaluation and treatment of this patient's health issues addressed during today's encounter. I agree with the assessment and therapeutic plan as outlined in the note.   Signed,   Jiles Prows, MD,  Allergy and Immunology,  [facility] [place].

## 2016-12-30 NOTE — Telephone Encounter (Signed)
Spoke with Pt. Pt still has concerns and is not feeling better. Pt scheduled for appt today at 2:30 to see [facility].

## 2016-12-30 NOTE — Patient Instructions (Addendum)
  1. Continue Symbicort 160 mcg  2 puffs twice a day      During asthma flares/respiratory illnesses add on 2 puffs twice a day of Asmanex 100 mcg  2. DepoMedrol 80 mg IM in the office  3. Begin Azithromycin 500 mg on day 1, then 250 mg for  4 more days.   4. Continue omeprazole 20 mg in morning. Increase ranitidine to 150 mg twice a day. Attempt to consolidate caffeine and alcohol consumption  5. Begin Mucinex 1200 mg twice a day.  Increase fluid intake as tolerated  6. Continue OTC Zyrtec daily   7. Continue ProAir HFA 2 puffs every 4-6 hours if needed  8. Return to clinic in 2 months or earlier if problem

## 2016-12-31 ENCOUNTER — Ambulatory Visit
Admission: RE | Admit: 2016-12-31 | Discharge: 2016-12-31 | Disposition: A | Discharge status: Home / Self Care | Payer: Medicare Other | Source: Ambulatory Visit | Attending: Neurology | Admitting: Neurology

## 2016-12-31 DIAGNOSIS — R202 Paresthesia of skin: Secondary | ICD-10-CM

## 2016-12-31 DIAGNOSIS — M5412 Radiculopathy, cervical region: Secondary | ICD-10-CM

## 2016-12-31 DIAGNOSIS — R29898 Other symptoms and signs involving the musculoskeletal system: Secondary | ICD-10-CM

## 2017-01-06 ENCOUNTER — Telehealth: Payer: Self-pay

## 2017-01-06 NOTE — Telephone Encounter (Signed)
Pt returned RN's call. She has company from out of town. She will be available tomorrow afternoon after 2 pm at [phone].

## 2017-01-06 NOTE — Telephone Encounter (Signed)
I called pt to discuss her MRI results. No answer, left a message asking her to call me back. 

## 2017-01-06 NOTE — Telephone Encounter (Signed)
-----   Message from Melvenia Beam, MD sent at 01/06/2017 11:29 AM EST ----- MRI of the cervical spine is stable, no changes since 2014 thanks

## 2017-01-07 NOTE — Telephone Encounter (Signed)
I called pt. I advised her that her MRI cervical spine is stable, no changes sine 2014. I reminded pt of her EMG/NCV appt on 01/14/17. Pt verbalized understanding of results. Pt had no questions at this time but was encouraged to call back if questions arise.

## 2017-01-07 NOTE — Telephone Encounter (Signed)
Pt has called for RN Cyril Mourning to try an obtain information from her, she is asking for a call back

## 2017-01-14 ENCOUNTER — Ambulatory Visit (INDEPENDENT_AMBULATORY_CARE_PROVIDER_SITE_OTHER): Payer: Medicare Other | Admitting: Neurology

## 2017-01-14 DIAGNOSIS — Z0289 Encounter for other administrative examinations: Secondary | ICD-10-CM

## 2017-01-14 DIAGNOSIS — R29898 Other symptoms and signs involving the musculoskeletal system: Secondary | ICD-10-CM | POA: Diagnosis not present

## 2017-01-14 DIAGNOSIS — F321 Major depressive disorder, single episode, moderate: Secondary | ICD-10-CM

## 2017-01-14 DIAGNOSIS — R202 Paresthesia of skin: Secondary | ICD-10-CM

## 2017-01-14 DIAGNOSIS — R253 Fasciculation: Secondary | ICD-10-CM

## 2017-01-14 DIAGNOSIS — H9313 Tinnitus, bilateral: Secondary | ICD-10-CM

## 2017-01-14 NOTE — Progress Notes (Signed)
Full Name: Susan Li Gender: Female MRN #: [id number] Date of Birth: 22-Jan-2040    Visit Date: 01/14/17 10:23 Age: 77 Years 44 Months Old Examining Physician: Sarina Ill, MD  Referring Physician: Inda Merlin, MD  History: Left arm pain and muscle twitches  Summary: All conductions and muscles were normal (see tables below)    Conclusion: This is a normal study.  Sarina Ill  M.D.  Discussed results of emg/ncs, reassured patient she does not have ALS or motor neuron disease. 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   A total of 15 minutes was spent in with this patient face-to-face; this is in addition to EMG/NCS. Over half this time was spent on counseling patient on the depression, tinnitus diagnosis and different therapeutic options available.    [facility] [place], [place] [address] Tel: [phone] Fax: [phone]        [facility]    Nerve / Sites Muscle Latency Ref. Amplitude Ref. Rel Amp Segments Distance Velocity Ref. Area    ms ms mV mV %  cm m/s m/s mVms  L Median - APB     Wrist APB 3.9 ?4.4 3.4 ?4.0 100 Wrist - APB 7   9.7     Upper arm APB 7.5  3.1  91.2 Upper arm - Wrist 18 50 ?49 9.0  L Ulnar - ADM     Wrist ADM 2.9 ?3.3 4.8 ?6.0 100 Wrist - ADM 7   16.2     B.Elbow ADM 6.1  4.4  92.8 B.Elbow - Wrist 17 53 ?49 15.3     A.Elbow ADM 8.0  4.2  95.4 A.Elbow - B.Elbow 10 55 ?49 14.6         A.Elbow - Wrist 0 0    L Tibial - AH     Ankle AH 5.5 ?5.8 8.4 ?4.0 100 Ankle - AH 9   27.8     Pop fossa AH 14.6  8.8  104 Pop fossa - Ankle 38 41 ?41 30.2           SNC    Nerve / Sites Rec.  Site Peak Lat Ref.  Amp Ref. Segments Distance Peak Diff Ref.    ms ms V V  cm ms ms  L Sural - Ankle (Calf)     Calf Ankle 3.4 ?4.4 6 ?6 Calf - Ankle 14    L Superficial peroneal - Ankle     Lat leg Ankle 4.3 ?4.4 6 ?6 Lat leg - Ankle 14    L Median, Ulnar - Transcarpal comparison     Median Palm Wrist 2.0 ?2.2 38 ?35 Median Palm - Wrist 8       Ulnar Palm Wrist 1.9 ?2.2 12 ?12 Ulnar Palm - Wrist 8          Median Palm - Ulnar Palm  0.1 ?0.4  L Median - Orthodromic (Dig II, Mid palm)     Dig II Wrist 2.9 ?3.4 15 ?10 Dig II - Wrist 13    L Ulnar - Orthodromic, (Dig V, Mid palm)     Dig V Wrist 2.6 ?3.1 9 ?5  Dig V - Wrist 11                 F  Wave    Nerve F Lat Ref.   ms ms  L Ulnar - ADM 27.3 ?32.0  L Tibial - AH 47.7 ?56.0         EMG full       EMG       EMG Summary Table    Spontaneous MUAP Recruitment  Muscle IA Fib PSW Fasc Other Amp Dur. Poly Pattern  L. Deltoid Normal None None None _______ Normal Normal Normal Normal  L. Biceps brachii Normal None None None _______ Normal Normal Normal Normal  L. Triceps brachii Normal None None None _______ Normal Normal Normal Normal  L. Opponens pollicis Normal None None None _______ Normal Normal Normal Normal  L. Pronator teres Normal None None None _______ Normal Normal Normal Normal  L. Iliopsoas Normal None None None _______ Normal Normal Normal Normal  R. Iliopsoas Normal None None None _______ Normal Normal Normal Normal  L. Vastus medialis Normal None None None _______ Normal Normal Normal Normal  R. Vastus medialis Normal None None None _______ Normal Normal Normal Normal  L. Vastus lateralis Normal None None None _______ Normal Normal Normal Normal  R. Vastus lateralis Normal None None None _______ Normal Normal Normal Normal  L. Tibialis anterior Normal None None None _______ Normal Normal Normal Normal  R. Tibialis anterior Normal None None None _______ Normal Normal Normal Normal  L. Gastrocnemius (Medial head)  Normal None None None _______ Normal Normal Normal Normal  R. Gastrocnemius (Medial head) Normal None None None _______ Normal Normal Normal Normal

## 2017-01-17 NOTE — Progress Notes (Signed)
Se procedure note

## 2017-01-17 NOTE — Procedures (Signed)
Full Name: Susan Li Gender: Female MRN #: [id number] Date of Birth: May 06, 2040    Visit Date: 01/14/17 10:23 Age: 77 Years 47 Months Old Examining Physician: Sarina Ill, MD  Referring Physician: Inda Merlin, MD  History: Left arm pain and muscle twitches  Summary: All conductions and muscles were normal (see tables below)    Conclusion: This is a normal study.  Sarina Ill  M.D.  Discussed results of emg/ncs, reassured patient she does not have ALS or motor neuron disease. 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   A total of 15 minutes was spent in with this patient face-to-face; this is in addition to EMG/NCS. Over half this time was spent on counseling patient on the depression, tinnitus diagnosis and different therapeutic options available.    [facility] [place], [place] [address] Tel: [phone] Fax: [phone]        [facility]    Nerve / Sites Muscle Latency Ref. Amplitude Ref. Rel Amp Segments Distance Velocity Ref. Area    ms ms mV mV %  cm m/s m/s mVms  L Median - APB     Wrist APB 3.9 ?4.4 3.4 ?4.0 100 Wrist - APB 7   9.7     Upper arm APB 7.5  3.1  91.2 Upper arm - Wrist 18 50 ?49 9.0  L Ulnar - ADM     Wrist ADM 2.9 ?3.3 4.8 ?6.0 100 Wrist - ADM 7   16.2     B.Elbow ADM 6.1  4.4  92.8 B.Elbow - Wrist 17 53 ?49 15.3     A.Elbow ADM 8.0  4.2  95.4 A.Elbow - B.Elbow 10 55 ?49 14.6         A.Elbow - Wrist 0 0    L Tibial - AH     Ankle AH 5.5 ?5.8 8.4 ?4.0 100 Ankle - AH 9   27.8     Pop fossa AH 14.6  8.8  104 Pop fossa - Ankle 38 41 ?41 30.2           SNC    Nerve / Sites Rec.  Site Peak Lat Ref.  Amp Ref. Segments Distance Peak Diff Ref.    ms ms V V  cm ms ms  L Sural - Ankle (Calf)     Calf Ankle 3.4 ?4.4 6 ?6 Calf - Ankle 14    L Superficial peroneal - Ankle     Lat leg Ankle 4.3 ?4.4 6 ?6 Lat leg - Ankle 14    L Median, Ulnar - Transcarpal comparison     Median Palm Wrist 2.0 ?2.2 38 ?35 Median Palm - Wrist 8       Ulnar Palm Wrist 1.9 ?2.2 12 ?12 Ulnar Palm - Wrist 8          Median Palm - Ulnar Palm  0.1 ?0.4  L Median - Orthodromic (Dig II, Mid palm)     Dig II Wrist 2.9 ?3.4 15 ?10 Dig II - Wrist 13    L Ulnar - Orthodromic, (Dig V, Mid palm)     Dig V Wrist 2.6 ?3.1 9 ?5  Dig V - Wrist 11                 F  Wave    Nerve F Lat Ref.   ms ms  L Ulnar - ADM 27.3 ?32.0  L Tibial - AH 47.7 ?56.0         EMG full       EMG       EMG Summary Table    Spontaneous MUAP Recruitment  Muscle IA Fib PSW Fasc Other Amp Dur. Poly Pattern  L. Deltoid Normal None None None _______ Normal Normal Normal Normal  L. Biceps brachii Normal None None None _______ Normal Normal Normal Normal  L. Triceps brachii Normal None None None _______ Normal Normal Normal Normal  L. Opponens pollicis Normal None None None _______ Normal Normal Normal Normal  L. Pronator teres Normal None None None _______ Normal Normal Normal Normal  L. Iliopsoas Normal None None None _______ Normal Normal Normal Normal  R. Iliopsoas Normal None None None _______ Normal Normal Normal Normal  L. Vastus medialis Normal None None None _______ Normal Normal Normal Normal  R. Vastus medialis Normal None None None _______ Normal Normal Normal Normal  L. Vastus lateralis Normal None None None _______ Normal Normal Normal Normal  R. Vastus lateralis Normal None None None _______ Normal Normal Normal Normal  L. Tibialis anterior Normal None None None _______ Normal Normal Normal Normal  R. Tibialis anterior Normal None None None _______ Normal Normal Normal Normal  L. Gastrocnemius (Medial head)  Normal None None None _______ Normal Normal Normal Normal  R. Gastrocnemius (Medial head) Normal None None None _______ Normal Normal Normal Normal

## 2017-01-21 ENCOUNTER — Encounter: Payer: Self-pay | Admitting: Family Medicine

## 2017-01-21 ENCOUNTER — Ambulatory Visit (INDEPENDENT_AMBULATORY_CARE_PROVIDER_SITE_OTHER): Payer: Medicare Other | Admitting: Family Medicine

## 2017-01-21 VITALS — BP 108/70 | HR 95 | Ht 64.0 in | Wt 135.0 lb

## 2017-01-21 DIAGNOSIS — R05 Cough: Secondary | ICD-10-CM | POA: Diagnosis not present

## 2017-01-21 DIAGNOSIS — J4541 Moderate persistent asthma with (acute) exacerbation: Secondary | ICD-10-CM | POA: Insufficient documentation

## 2017-01-21 DIAGNOSIS — K219 Gastro-esophageal reflux disease without esophagitis: Secondary | ICD-10-CM | POA: Diagnosis not present

## 2017-01-21 DIAGNOSIS — J3089 Other allergic rhinitis: Secondary | ICD-10-CM

## 2017-01-21 DIAGNOSIS — J4540 Moderate persistent asthma, uncomplicated: Secondary | ICD-10-CM | POA: Diagnosis not present

## 2017-01-21 DIAGNOSIS — R059 Cough, unspecified: Secondary | ICD-10-CM

## 2017-01-21 MED ORDER — OMEPRAZOLE 20 MG PO CPDR: 20.0000 mg | DELAYED_RELEASE_CAPSULE | Freq: Every day | ORAL | 3 refills | Status: DC

## 2017-01-21 MED ORDER — RANITIDINE HCL 150 MG PO TABS: 150.0000 mg | ORAL_TABLET | Freq: Every day | ORAL | 1 refills | Status: DC

## 2017-01-21 MED ORDER — BENZONATATE 100 MG PO CAPS: 100.0000 mg | ORAL_CAPSULE | Freq: Three times a day (TID) | ORAL | 0 refills | Status: DC | PRN

## 2017-01-21 NOTE — Patient Instructions (Addendum)
  1. Continue Symbicort 160 mcg  2 puffs twice a day      During asthma flares/respiratory illnesses add on 2 puffs twice a day of Asmanex 100 mcg  2. Refer to ENT   3. Continue omeprazole 20 mg in morning and 20 in the evening. Increase ranitidine to 150 mg twice a day. Attempt to consolidate caffeine and alcohol consumption  4. Begin Mucinex 1200 mg twice a day.  Increase fluid intake as tolerated  5. Continue OTC Zyrtec daily   6. Continue ProAir HFA 2 puffs every 4-6 hours if needed  7. Tessalon Perles 100 mg three times a day as needed for cough  8. Return to clinic in 2 months or earlier if problem

## 2017-01-21 NOTE — Progress Notes (Signed)
[place] [address] Dept: [phone]  FAMILY NURSE PRACTITIONER FOLLOW UP NOTE  Patient ID: Susan Li, female    DOB: [birth year]  Age: 77 y.o. MRN: [id number] Date of Office Visit: 01/21/2017  Assessment  Chief Complaint: Cough (Pt presents for unresolved cough. Pt has had antibiotics and Prednisone.)  HPI Susan Li is a 77 year old female patient who presents to the clinic today for a sick visit.  She was last seen in the clinic by Gareth Morgan, NP on 12/30/2016 for re-evaluation of chronic cough, shortness of breath, and wheezing.  She had previously been seen in the clinic on 11/25/2016 by Dr. Neldon Mc and received Depo-Medrol injection and next on 12/24/2016 she was seen by Dr. Nelva Bush and she received prednisone, Asmanex, and Dymista.  At the last visit on 12-30-2016 she received a Depo-Medrol injection, azithromycin, guaifenesin, as well as medications to control gastroesophageal reflux including omeprazole and ranitidine.  At today's visit, she reports that she is still coughing.  She reports coughing is worse in the morning and produces a lot of phlegm.  This seems to get better after she takes her morning medications including her inhalers.  She denies fever, chills, sweats, and sick contacts.    Susan Li is reporting some shortness of breath especially with activity and in the morning.  She currently takes Symbicort 160 -2 puffs twice a day, however, she forgets the second dose of Symbicort 2-3 nights a week.  She uses Asmanex as an add-on therapy 2-3 days a week.  Gastroesophageal reflux disease is reported as well controlled with no signs of heartburn.  She is currently taking omeprazole 20 mg a day, Zantac 150 mg twice a day, along with diet modifications including decreased caffeine, decrease alcohol, and decreased chocolate intake.  Rhinitis is reported as currently well controlled.  She does report she can feel mucus going down the back of her throat.   She takes Mucinex a couple of days a week as she feels like she needs it.  She has reported an increase in fluid intake in the last 2 weeks.  Drug Allergies:  Allergies  Allergen Reactions  . Morphine And Related Itching    Itching nose    Physical Exam: BP 108/70 (BP Location: Left Arm, Patient Position: Sitting, Cuff Size: Normal)   Pulse 95   Ht 5\' 4"  (1.626 m)   Wt 135 lb (61.2 kg)   SpO2 96%   BMI 23.17 kg/m    Physical Exam  Constitutional: She is oriented to person, place, and time. She appears well-developed and well-nourished.  HENT:  Right Ear: External ear normal.  Left Ear: External ear normal.  Lateral nares erythematous and edematous with clear nasal drainage noted.  Pharynx erythematous with clear drainage noted.  There are no exudates.  Ears normal.  Eyes normal.  Eyes: Conjunctivae are normal.  Neck: Normal range of motion.  Cardiovascular: Normal rate, regular rhythm and normal heart sounds.  S1-S2 normal.  Regular heart rate and rhythm.  Systolic murmur noted.  Pulmonary/Chest: Effort normal and breath sounds normal.  Lungs clear to auscultation with no crackles, no rales, no rhonchi, no wheezing noted.  Musculoskeletal: Normal range of motion.  Neurological: She is alert and oriented to person, place, and time.  Skin: Skin is warm and dry.  Psychiatric: She has a normal mood and affect. Her behavior is normal.    Diagnostics: FVC 2.31, FEV1 1.81.  Predicted FVC 2.83, predicted FEV1 2.13.  Spirometry  is within the normal range.    Assessment and Plan: 1. Cough   2. LPRD (laryngopharyngeal reflux disease)   3. Asthma, moderate persistent, well-controlled   4. Other allergic rhinitis     Meds ordered this encounter  Medications  . benzonatate (TESSALON PERLES) 100 MG capsule    Sig: Take 1 capsule (100 mg total) by mouth 3 (three) times daily as needed for cough.    Dispense:  20 capsule    Refill:  0  . omeprazole (PRILOSEC) 20 MG capsule     Sig: Take 1 capsule (20 mg total) by mouth daily.    Dispense:  30 capsule    Refill:  3  . ranitidine (ZANTAC) 150 MG tablet    Sig: Take 1 tablet (150 mg total) by mouth at bedtime.    Dispense:  30 tablet    Refill:  1    Patient Instructions   1. Continue Symbicort 160 mcg  2 puffs twice a day      During asthma flares/respiratory illnesses add on 2 puffs twice a day of Asmanex 100 mcg  2. Refer to ENT   3. Continue omeprazole 20 mg in morning and 20 in the evening. Increase ranitidine to 150 mg twice a day. Attempt to consolidate caffeine and alcohol consumption  4. Begin Mucinex 1200 mg twice a day.  Increase fluid intake as tolerated  5. Continue OTC Zyrtec daily   6. Continue ProAir HFA 2 puffs every 4-6 hours if needed  7. Tessalon Perles 100 mg three times a day as needed for cough  8. Return to clinic in 2 months or earlier if problem     Return in about 2 months (around 03/21/2017), or if symptoms worsen or fail to improve.    Thank you for the opportunity to care for this patient.  Please do not hesitate to contact me with questions.  Gareth Morgan, FNP [facility]

## 2017-01-25 NOTE — Progress Notes (Signed)
Referral faxed to ENT

## 2017-01-28 DIAGNOSIS — R062 Wheezing: Secondary | ICD-10-CM | POA: Diagnosis not present

## 2017-01-28 DIAGNOSIS — R05 Cough: Secondary | ICD-10-CM | POA: Diagnosis not present

## 2017-01-28 DIAGNOSIS — K219 Gastro-esophageal reflux disease without esophagitis: Secondary | ICD-10-CM | POA: Diagnosis not present

## 2017-02-04 DIAGNOSIS — F331 Major depressive disorder, recurrent, moderate: Secondary | ICD-10-CM | POA: Diagnosis not present

## 2017-02-11 DIAGNOSIS — F331 Major depressive disorder, recurrent, moderate: Secondary | ICD-10-CM | POA: Diagnosis not present

## 2017-02-16 IMAGING — US US ABDOMEN COMPLETE
1 series · 14 of 25 positions shown · non-contrast
Comparison: None.

CLINICAL DATA: Acute epigastric pain

EXAM:
ULTRASOUND ABDOMEN COMPLETE

[Series 1: us abdomen complete · 0.25mm/px · 14 of 85 slices shown]
[im 1/85]
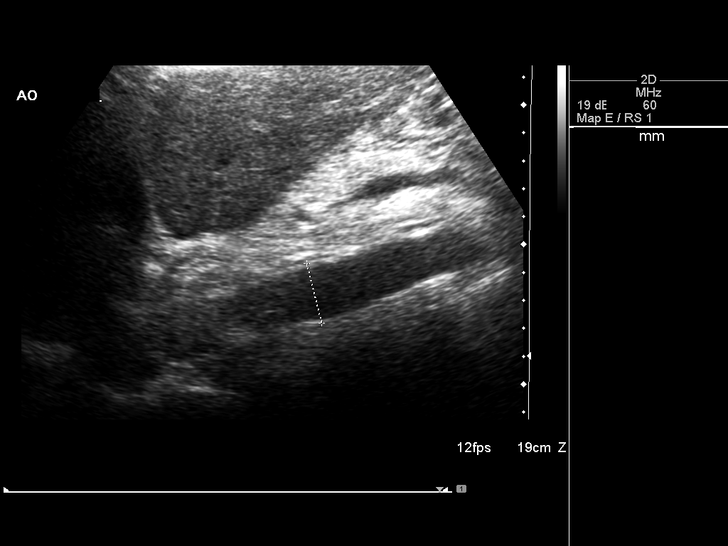
[im 8/85]
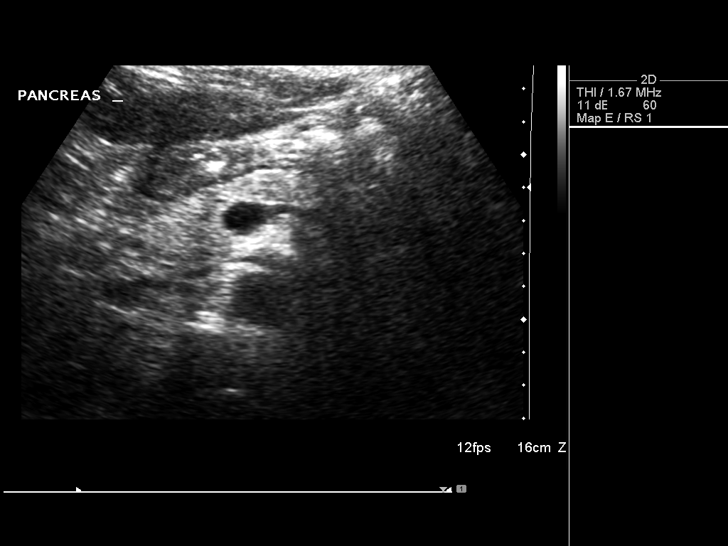
[im 15/85]
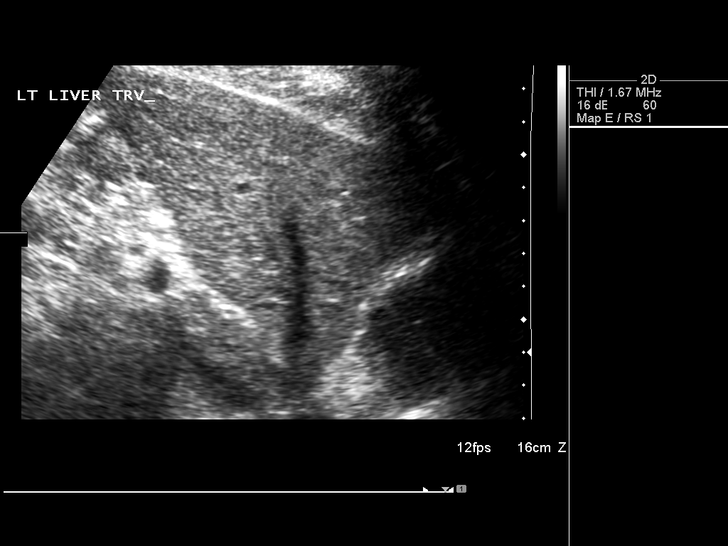
[im 22/85]
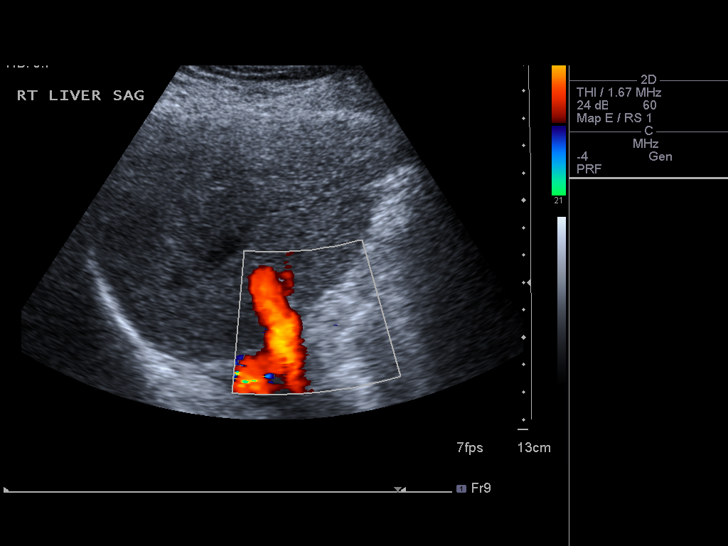
[im 29/85]
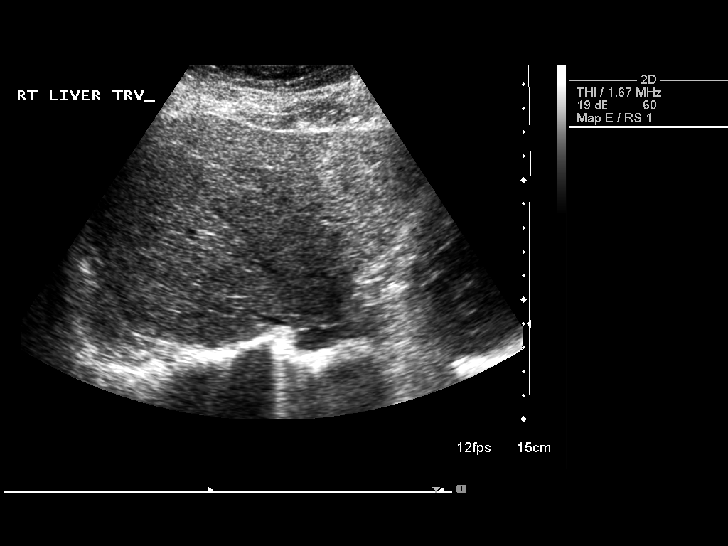
[im 32/85]
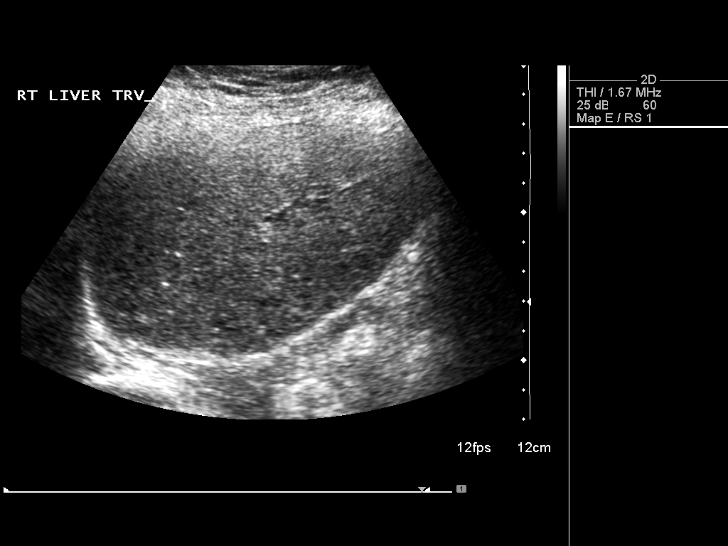
[im 39/85]
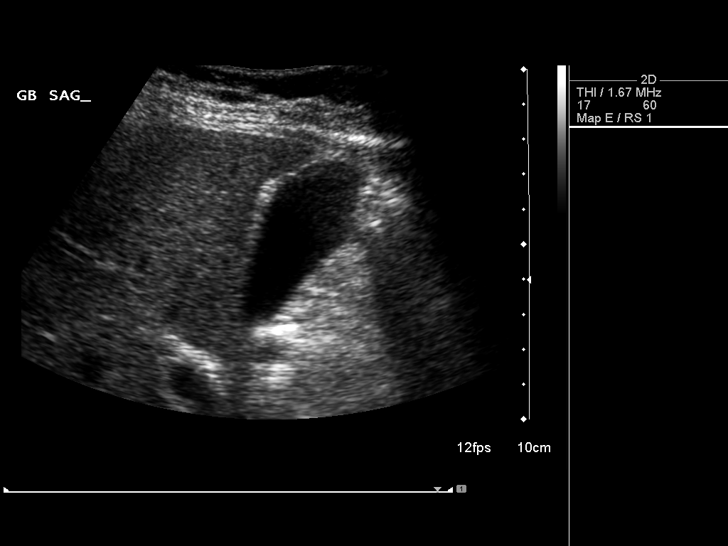
[im 46/85]
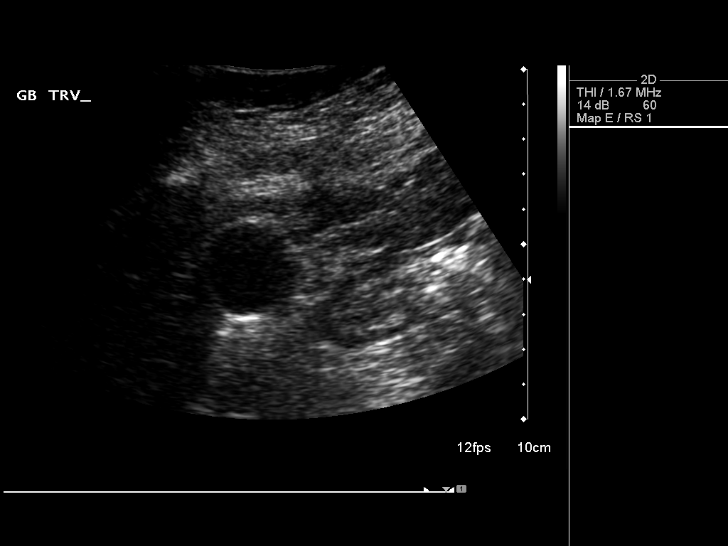
[im 53/85]
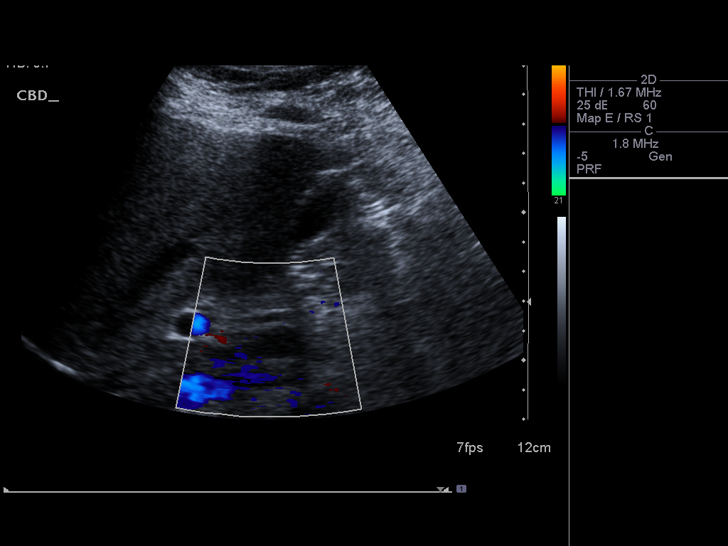
[im 57/85]
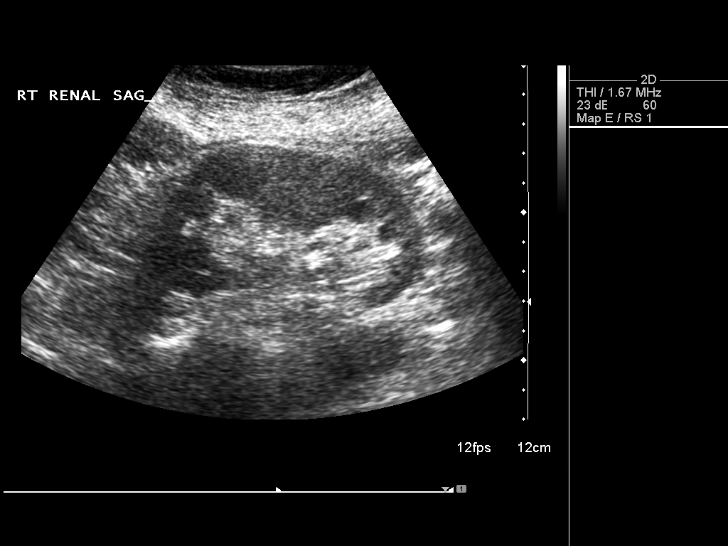
[im 64/85]
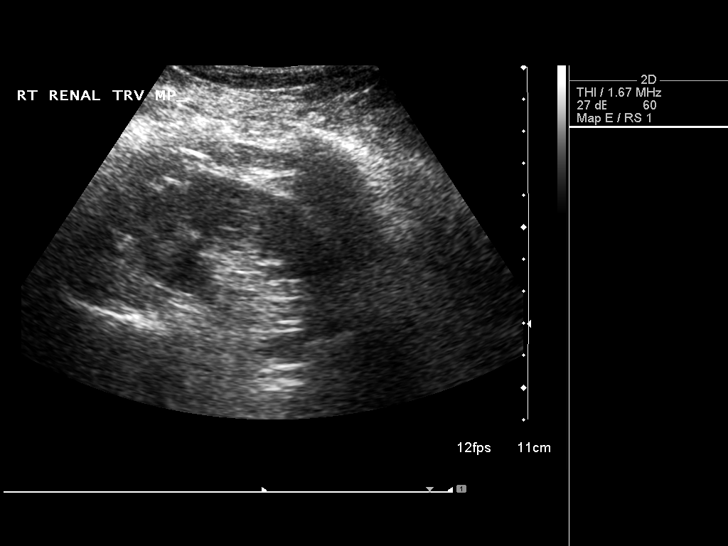
[im 71/85]
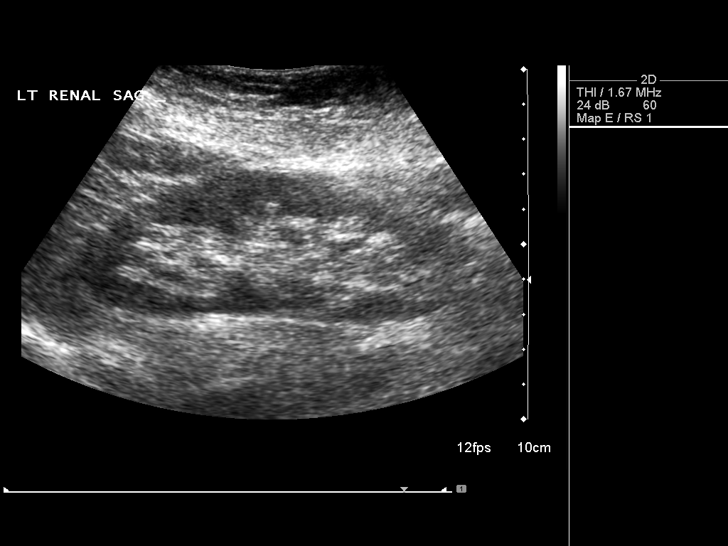
[im 78/85]
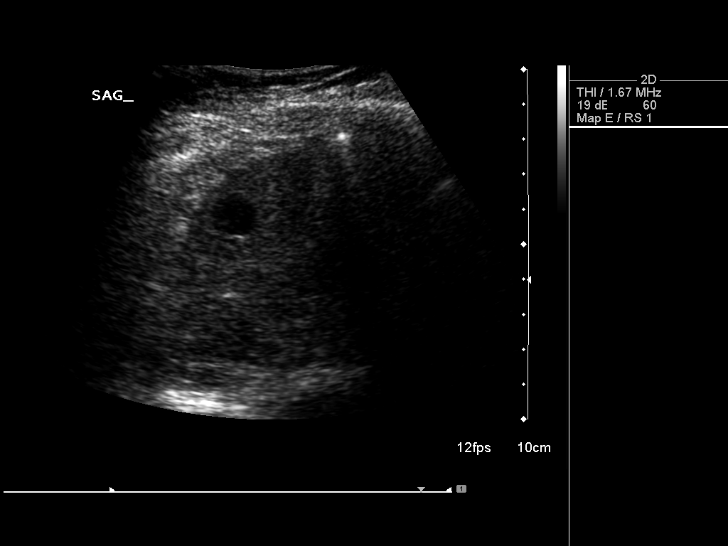
[im 85/85]
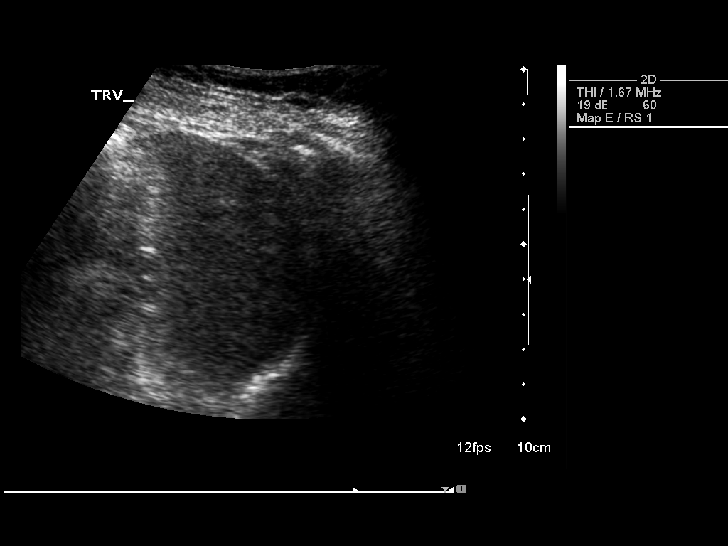

[14 of 25 positions shown; findings below may reference images not displayed]

FINDINGS: Gallbladder: No gallstones or wall thickening visualized. No
sonographic Murphy sign noted.

Common bile duct: Diameter: 4.3 mm.

Liver: No focal lesion identified. Within normal limits in
parenchymal echogenicity.

IVC: No abnormality visualized.

Pancreas: Visualized portion unremarkable.

Spleen: 5.2 cm.  There is a 1.4 cm cysts noted.

Right Kidney: Length: 10.4 cm.. Echogenicity within normal limits.
No mass or hydronephrosis visualized.

Left Kidney: Length: 10.9 cm. Echogenicity within normal limits. No
mass or hydronephrosis visualized.

Abdominal aorta: No aneurysm visualized.

Other findings: None.
IMPRESSION: 1. No acute findings and no explanation for acute epigastric pain.

## 2017-03-04 DIAGNOSIS — F331 Major depressive disorder, recurrent, moderate: Secondary | ICD-10-CM | POA: Diagnosis not present

## 2017-03-11 DIAGNOSIS — M5136 Other intervertebral disc degeneration, lumbar region: Secondary | ICD-10-CM | POA: Diagnosis not present

## 2017-03-11 DIAGNOSIS — F331 Major depressive disorder, recurrent, moderate: Secondary | ICD-10-CM | POA: Diagnosis not present

## 2017-03-22 ENCOUNTER — Other Ambulatory Visit: Payer: Self-pay | Admitting: Allergy and Immunology

## 2017-04-05 DIAGNOSIS — F331 Major depressive disorder, recurrent, moderate: Secondary | ICD-10-CM | POA: Diagnosis not present

## 2017-07-08 DIAGNOSIS — J4520 Mild intermittent asthma, uncomplicated: Secondary | ICD-10-CM | POA: Diagnosis not present

## 2017-07-08 DIAGNOSIS — F329 Major depressive disorder, single episode, unspecified: Secondary | ICD-10-CM | POA: Diagnosis not present

## 2017-07-08 DIAGNOSIS — F4321 Adjustment disorder with depressed mood: Secondary | ICD-10-CM | POA: Diagnosis not present

## 2017-08-04 ENCOUNTER — Ambulatory Visit (INDEPENDENT_AMBULATORY_CARE_PROVIDER_SITE_OTHER): Payer: Medicare Other | Admitting: Allergy and Immunology

## 2017-08-04 ENCOUNTER — Encounter: Payer: Self-pay | Admitting: Allergy and Immunology

## 2017-08-04 VITALS — BP 115/70 | HR 85 | Temp 98.0°F | Resp 16 | Ht 62.5 in | Wt 148.6 lb

## 2017-08-04 DIAGNOSIS — J3089 Other allergic rhinitis: Secondary | ICD-10-CM | POA: Diagnosis not present

## 2017-08-04 DIAGNOSIS — K219 Gastro-esophageal reflux disease without esophagitis: Secondary | ICD-10-CM

## 2017-08-04 DIAGNOSIS — J4540 Moderate persistent asthma, uncomplicated: Secondary | ICD-10-CM | POA: Diagnosis not present

## 2017-08-04 NOTE — Patient Instructions (Addendum)
  1. Continue Symbicort 160 mcg  2 puffs 1-2 times a day depending on asthma activity. Can add Asmanex 100 2 inhalations 2 times per day if asthma flare up      2. Continue omeprazole 20 mg in morning. Can add ranitidine 150 -300 in evening if needed  3. Continue OTC Zyrtec daily   4. Continue ProAir HFA 2 puffs every 4-6 hours if needed  5. Obtain fall flu vaccine  6. Return to clinic in 6 months or earlier if problem

## 2017-08-04 NOTE — Progress Notes (Signed)
Follow-up Note  Referring Provider: Josetta Huddle, MD Primary Provider: Josetta Huddle, MD Date of Office Visit: 08/04/2017  Subjective:   Susan Li (DOB: 12-20-1940) is a 77 y.o. female who returns to the [place] on 08/04/2017 in re-evaluation of the following:  HPI: Susan Li returns to this clinic in reevaluation of her asthma and allergic rhinitis and LPR.  I have not seen her in this clinic since 25 November 2016 but she did visit with our nurse practitioner in January for a respiratory tract flare that appeared to be precipitated by a viral respiratory tract infection.  Overall she has really done very well since last being seen in this clinic with very little issue revolving around her asthma.  Rarely does she use a short acting bronchodilator averaging out to less than 1 time per week while utilizing Symbicort at a dose of 320 mcg daily.  She has had no need to add Asmanex at this point.  Her reflux is under excellent control while using omeprazole 20 mg in the morning and occasionally adding in ranitidine in the evening.  She is still undergoing the grieving process from the loss of her husband.  It has been approximately 14 months since his passing.  She just recently started Lexapro over the course of the past 4 weeks.  Allergies as of 08/04/2017      Reactions   Morphine And Related Itching   Itching nose      Medication List      albuterol 108 (90 Base) MCG/ACT inhaler Commonly known as:  PROAIR HFA Inhale two puffs every four to six hours as needed for cough or wheeze.   ALPRAZolam 0.5 MG tablet Commonly known as:  XANAX 0.25 mg at bedtime.   benzonatate 100 MG capsule Commonly known as:  TESSALON PERLES Take 1 capsule (100 mg total) by mouth 3 (three) times daily as needed for cough.   BIOTIN PO Take 5,000 mg by mouth daily.   budesonide-formoterol 160-4.5 MCG/ACT inhaler Commonly known as:  SYMBICORT USE 2 PUFFS INHALED EVERY 12 HOURS  TO PREVENT COUGH OR WHEEZE-RINSE, GARGLE, SPIT AFTER USE.   CALCIUM + D PO Take 1,200 mg by mouth daily.   cetirizine 5 MG tablet Commonly known as:  ZYRTEC Take 5 mg by mouth daily.   escitalopram 10 MG tablet Commonly known as:  LEXAPRO   estradiol 1 MG tablet Commonly known as:  ESTRACE Take 1 mg by mouth daily. Pt takes 0.5 mg   MUCINEX ALLERGY PO Take by mouth.   omeprazole 20 MG capsule Commonly known as:  PRILOSEC Take 1 capsule (20 mg total) by mouth daily.   ranitidine 150 MG tablet Commonly known as:  ZANTAC Take 1 tablet (150 mg total) by mouth at bedtime.   zolpidem 10 MG tablet Commonly known as:  AMBIEN Take 10 mg by mouth at bedtime as needed for sleep.       Past Medical History:  Diagnosis Date  . Arthritis    hips,backs,hands; osteoarthritis  . Asthma    Symbicort as needed 3x week  . Bursitis   . Fibromyalgia   . GERD (gastroesophageal reflux disease)   . Insomnia   . Sciatica of right side   . Vitamin D deficiency     Past Surgical History:  Procedure Laterality Date  . ABDOMINAL HYSTERECTOMY  2000  . Buninonectomy  2008  . [place] SURGERY  2004  . COLONOSCOPY WITH PROPOFOL N/A 04/25/2012  Procedure: COLONOSCOPY WITH PROPOFOL;  Surgeon: Garlan Fair, MD;  Location: [facility];  Service: Endoscopy;  Laterality: N/A;  . LASIK  2004  . left shoulder surgery    . RESECTION DISTAL CLAVICAL Left 08/24/2014   Procedure: RESECTION DISTAL CLAVICAL;  Surgeon: Melrose Nakayama, MD;  Location: [place];  Service: Orthopedics;  Laterality: Left;  . SHOULDER ARTHROSCOPY WITH SUBACROMIAL DECOMPRESSION Left 08/24/2014   Procedure: ARTHROSCOPY  LEFT SHOULDER, SUBACROMIAL DECOMPRESSION, DISTAL CLAVICLE RESECTION AND DEBRIDEMENT;  Surgeon: Melrose Nakayama, MD;  Location: [facility];  Service: Orthopedics;  Laterality: Left;  . Tendernitis Surgery  99    Review of systems negative except as noted in HPI / PMHx or  noted below:  Review of Systems  Constitutional: Negative.   HENT: Negative.   Eyes: Negative.   Respiratory: Negative.   Cardiovascular: Negative.   Gastrointestinal: Negative.   Genitourinary: Negative.   Musculoskeletal: Negative.   Skin: Negative.   Neurological: Negative.   Endo/Heme/Allergies: Negative.   Psychiatric/Behavioral: Negative.      Objective:   Vitals:   08/04/17 1027  BP: 115/70  Pulse: 85  Resp: 16  Temp: 98 F (36.7 C)  SpO2: 95%   Height: 5' 2.5" (158.8 cm)  Weight: 148 lb 9.6 oz (67.4 kg)   Physical Exam  HENT:  Head: Normocephalic.  Right Ear: Tympanic membrane, external ear and ear canal normal.  Left Ear: Tympanic membrane, external ear and ear canal normal.  Nose: Nose normal. No mucosal edema or rhinorrhea.  Mouth/Throat: Uvula is midline, oropharynx is clear and moist and mucous membranes are normal. No oropharyngeal exudate.  Eyes: Conjunctivae are normal.  Neck: Trachea normal. No tracheal tenderness present. No tracheal deviation present. No thyromegaly present.  Cardiovascular: Normal rate, regular rhythm, S1 normal, S2 normal and normal heart sounds.  No murmur heard. Pulmonary/Chest: Breath sounds normal. No stridor. No respiratory distress. She has no wheezes. She has no rales.  Musculoskeletal: She exhibits no edema.  Lymphadenopathy:       Head (right side): No tonsillar adenopathy present.       Head (left side): No tonsillar adenopathy present.    She has no cervical adenopathy.  Neurological: She is alert.  Skin: No rash noted. She is not diaphoretic. No erythema. Nails show no clubbing.    Diagnostics:    Spirometry was performed and demonstrated an FEV1 of 1.88 at 101 % of predicted.  The patient had an Asthma Control Test with the following results: ACT Total Score: 21.    Assessment and Plan:   1. Asthma, moderate persistent, well-controlled   2. Other allergic rhinitis   3. LPRD (laryngopharyngeal reflux  disease)     1. Continue Symbicort 160 mcg  2 puffs 1-2 times a day depending on asthma activity. Can add Asmanex 100 2 inhalations 2 times per day if asthma flare up      2. Continue omeprazole 20 mg in morning. Can add ranitidine 150 -300 in evening if needed  3. Continue OTC Zyrtec daily   4. Continue ProAir HFA 2 puffs every 4-6 hours if needed  5. Obtain fall flu vaccine  6. Return to clinic in 6 months or earlier if problem  Susan Li appears to be doing relatively well at this point in time while consistently using anti-inflammatory agents for her respiratory tract and treatment directed against reflux and she will continue on this therapy and I will see her back in this clinic in 6 months  or earlier if there is a problem.  Susan Katz, MD [facility]ergy / [facility] [place]

## 2017-08-05 ENCOUNTER — Encounter: Payer: Self-pay | Admitting: Allergy and Immunology

## 2017-09-24 ENCOUNTER — Other Ambulatory Visit: Payer: Self-pay | Admitting: Internal Medicine

## 2017-09-24 ENCOUNTER — Ambulatory Visit
Admission: RE | Admit: 2017-09-24 | Discharge: 2017-09-24 | Disposition: A | Discharge status: Home / Self Care | Payer: Medicare Other | Source: Ambulatory Visit | Attending: Internal Medicine | Admitting: Internal Medicine

## 2017-09-24 DIAGNOSIS — S0993XA Unspecified injury of face, initial encounter: Secondary | ICD-10-CM | POA: Diagnosis not present

## 2017-09-24 DIAGNOSIS — Z23 Encounter for immunization: Secondary | ICD-10-CM | POA: Diagnosis not present

## 2017-09-24 DIAGNOSIS — S0993XD Unspecified injury of face, subsequent encounter: Secondary | ICD-10-CM | POA: Diagnosis not present

## 2017-10-18 DIAGNOSIS — H2513 Age-related nuclear cataract, bilateral: Secondary | ICD-10-CM | POA: Diagnosis not present

## 2017-10-18 DIAGNOSIS — H5213 Myopia, bilateral: Secondary | ICD-10-CM | POA: Diagnosis not present

## 2017-10-18 DIAGNOSIS — H52203 Unspecified astigmatism, bilateral: Secondary | ICD-10-CM | POA: Diagnosis not present

## 2017-11-17 ENCOUNTER — Other Ambulatory Visit: Payer: Self-pay | Admitting: Allergy and Immunology

## 2017-11-19 ENCOUNTER — Ambulatory Visit (INDEPENDENT_AMBULATORY_CARE_PROVIDER_SITE_OTHER): Payer: Medicare Other | Admitting: Allergy

## 2017-11-19 ENCOUNTER — Encounter: Payer: Self-pay | Admitting: Allergy

## 2017-11-19 VITALS — BP 120/78 | HR 88 | Temp 98.1°F | Resp 18

## 2017-11-19 DIAGNOSIS — J0100 Acute maxillary sinusitis, unspecified: Secondary | ICD-10-CM

## 2017-11-19 DIAGNOSIS — K219 Gastro-esophageal reflux disease without esophagitis: Secondary | ICD-10-CM

## 2017-11-19 DIAGNOSIS — J3089 Other allergic rhinitis: Secondary | ICD-10-CM

## 2017-11-19 DIAGNOSIS — J4541 Moderate persistent asthma with (acute) exacerbation: Secondary | ICD-10-CM | POA: Diagnosis not present

## 2017-11-19 MED ORDER — AMOXICILLIN-POT CLAVULANATE 875-125 MG PO TABS: 1.0000 | ORAL_TABLET | Freq: Two times a day (BID) | ORAL | 0 refills | Status: AC

## 2017-11-19 NOTE — Patient Instructions (Signed)
  1. While sick increase Symbicort 12mcg  2 puffs 2 times a day until better then can resume daily use.       Start Augmentin 875mg  1 tablet twice a day x 10 days for sinusitis.    If you are not feeling better by end of the weekend add Asmanex 131mcg 2 inhalations 2 times per day until improved.   And also start prednisone pack provided today as directed (if symptoms improve then you do not need to take the prednisone pack).    To help thin/loosen mucus can continue Mucinex 600-1200mg  up to twice a day with plenty of water.   Maintain good hydration.   Have access to albuterol inhaler 2 puffs every 4-6 hours as needed for cough/wheeze/shortness of breath/chest tightness.  May use 15-20 minutes prior to activity.   Monitor frequency of use.         2. Continue omeprazole 20 mg in morning. Can add ranitidine 150 -300 in evening if needed  3. Continue OTC Zyrtec daily as needed  4. Obtain fall flu vaccine if you have not done so this season  5. Return to clinic in 6 months or earlier if problem

## 2017-11-19 NOTE — Progress Notes (Signed)
Follow-up Note  RE: Susan Li MRN: [id number] DOB: May 19, 1940 Date of Office Visit: 11/19/2017   History of present illness: Susan Li is a 77 y.o. female presenting today for sick visit.  She was last seen in the office on August 04, 2017 by Dr. Neldon Mc.  She presents today as she has developed sick symptoms over the past 2 weeks.  She states 2 weeks ago she developed nausea, weakness, fatigue and wheeze.  She states the nausea, weakness, fatigue did improve but the wheeze has not gone away.  She also reports cough with sputum production of a yellow sputum as well as nasal congestion of a yellow drainage.  She also reports having chills and headache around her temple areas.  She has needed to use albuterol on some days and states she did require use today around 1 PM.  She states she has used Alka-Seltzer tablets and she did find one Mucinex tablet today that she took.  She denies any sick contacts. She states that she does continue on her Symbicort 160 mcg 2 puffs that she takes daily on most days.  She states she has not increased it to twice a day dosing at this time.  She also did not add in Asmanex but states she does have it at home.  She does continue on her daily omeprazole.  She states she ran out of Zyrtec.  Review of systems: Review of Systems  Constitutional: Positive for chills and malaise/fatigue. Negative for fever.  HENT: Positive for congestion and sinus pain. Negative for ear discharge, ear pain, nosebleeds and sore throat.   Eyes: Negative for pain, discharge and redness.  Respiratory: Positive for cough, sputum production and wheezing.   Cardiovascular: Negative for chest pain.  Gastrointestinal: Positive for nausea. Negative for abdominal pain, constipation, diarrhea, heartburn and vomiting.  Musculoskeletal: Negative for joint pain and myalgias.  Skin: Negative for itching and rash.  Neurological: Positive for headaches. Negative for dizziness.    All other  systems negative unless noted above in HPI  Past medical/social/surgical/family history have been reviewed and are unchanged unless specifically indicated below.  No changes  Medication List: Allergies as of 11/19/2017      Reactions   Morphine And Related Itching   Itching nose      Medication List        Accurate as of 11/19/17  4:24 PM. Always use your most recent med list.          albuterol 108 (90 Base) MCG/ACT inhaler Commonly known as:  PROVENTIL HFA;VENTOLIN HFA USE 2 PUFFS EVERY 4 TO 6 HOURS AS NEEDED FOR COUGH/WHEEZING.   ALPRAZolam 0.5 MG tablet Commonly known as:  XANAX 0.25 mg at bedtime.   amoxicillin-clavulanate 875-125 MG tablet Commonly known as:  AUGMENTIN Take 1 tablet by mouth 2 (two) times daily for 10 days.   BIOTIN PO Take 5,000 mg by mouth daily.   budesonide-formoterol 160-4.5 MCG/ACT inhaler Commonly known as:  SYMBICORT USE 2 PUFFS INHALED EVERY 12 HOURS TO PREVENT COUGH OR WHEEZE-RINSE, GARGLE, SPIT AFTER USE.   CALCIUM + D PO Take 1,200 mg by mouth daily.   cetirizine 5 MG tablet Commonly known as:  ZYRTEC Take 5 mg by mouth daily.   escitalopram 10 MG tablet Commonly known as:  LEXAPRO   estradiol 1 MG tablet Commonly known as:  ESTRACE Take 1 mg by mouth daily. Pt takes 0.5 mg   MUCINEX ALLERGY PO Take by mouth.  omeprazole 20 MG capsule Commonly known as:  PRILOSEC Take 1 capsule (20 mg total) by mouth daily.   zolpidem 10 MG tablet Commonly known as:  AMBIEN Take 10 mg by mouth at bedtime as needed for sleep.       Known medication allergies: Allergies  Allergen Reactions  . Morphine And Related Itching    Itching nose     Physical examination: Blood pressure 120/78, pulse 88, temperature 98.1 F (36.7 C), temperature source Oral, resp. rate 18, SpO2 95 %.  General: Alert, interactive, in no acute distress. HEENT: PERRLA, TMs pearly gray, turbinates moderately edematous with thick discharge, post-pharynx  mildly erythematous. Neck: Supple without lymphadenopathy. Lungs: Clear to auscultation without wheezing, rhonchi or rales. {no increased work of breathing.  Albuterol use 1 hour prior to exam CV: Normal S1, S2 without murmurs. Abdomen: Nondistended, nontender. Skin: Warm and dry, without lesions or rashes. Extremities:  No clubbing, cyanosis or edema. Neuro:   Grossly intact.  Diagnositics/Labs:  Spirometry: FEV1: 1.75L 84%, FVC: 2.21L 79% albuterol was used 1 hour prior to exam  Assessment and plan: Acute maxillary sinusitis -given 2-week duration of symptoms with no improvement will treat with antibiotics at this time. Moderate persistent asthma with exacerbation -have advised that she increase her Symbicort and if still remains symptomatic to perform her asthma action plan which includes adding in Asmanex as well as taking a steroid pack Allergic rhinitis LPRD  1. While sick increase Symbicort 141mcg  2 puffs 2 times a day until better then can resume daily use.      Start Augmentin 875mg  1 tablet twice a day x 10 days for sinusitis.    If you are not feeling better by end of the weekend add Asmanex 125mcg 2 inhalations 2 times per day until improved.   And also start prednisone pack provided today as directed (if symptoms improve then you do not need to take the prednisone pack).    To help thin/loosen mucus can continue Mucinex 600-1200mg  up to twice a day with plenty of water.   Maintain good hydration.   Have access to albuterol inhaler 2 puffs every 4-6 hours as needed for cough/wheeze/shortness of breath/chest tightness.  May use 15-20 minutes prior to activity.   Monitor frequency of use.         2. Continue omeprazole 20 mg in morning. Can add ranitidine 150 -300 in evening if needed  3. Continue OTC Zyrtec daily as needed  4. Obtain fall flu vaccine if you have not done so this season  5. Return to clinic in 6 months or earlier if problem    I appreciate the  opportunity to take part in [facility] care. Please do not hesitate to contact me with questions.  Sincerely,   Prudy Feeler, MD Allergy/Immunology [facility]

## 2017-12-02 DIAGNOSIS — Z1389 Encounter for screening for other disorder: Secondary | ICD-10-CM | POA: Diagnosis not present

## 2017-12-02 DIAGNOSIS — J4540 Moderate persistent asthma, uncomplicated: Secondary | ICD-10-CM | POA: Diagnosis not present

## 2017-12-02 DIAGNOSIS — Z0000 Encounter for general adult medical examination without abnormal findings: Secondary | ICD-10-CM | POA: Diagnosis not present

## 2017-12-02 DIAGNOSIS — K589 Irritable bowel syndrome without diarrhea: Secondary | ICD-10-CM | POA: Diagnosis not present

## 2017-12-02 DIAGNOSIS — Z79899 Other long term (current) drug therapy: Secondary | ICD-10-CM | POA: Diagnosis not present

## 2017-12-02 DIAGNOSIS — Z23 Encounter for immunization: Secondary | ICD-10-CM | POA: Diagnosis not present

## 2017-12-02 DIAGNOSIS — Z658 Other specified problems related to psychosocial circumstances: Secondary | ICD-10-CM | POA: Diagnosis not present

## 2017-12-02 DIAGNOSIS — J309 Allergic rhinitis, unspecified: Secondary | ICD-10-CM | POA: Diagnosis not present

## 2017-12-02 DIAGNOSIS — G4700 Insomnia, unspecified: Secondary | ICD-10-CM | POA: Diagnosis not present

## 2017-12-02 DIAGNOSIS — Z136 Encounter for screening for cardiovascular disorders: Secondary | ICD-10-CM | POA: Diagnosis not present

## 2017-12-02 DIAGNOSIS — F418 Other specified anxiety disorders: Secondary | ICD-10-CM | POA: Diagnosis not present

## 2017-12-02 DIAGNOSIS — F324 Major depressive disorder, single episode, in partial remission: Secondary | ICD-10-CM | POA: Diagnosis not present

## 2017-12-02 DIAGNOSIS — F329 Major depressive disorder, single episode, unspecified: Secondary | ICD-10-CM | POA: Diagnosis not present

## 2017-12-06 ENCOUNTER — Ambulatory Visit
Admission: RE | Admit: 2017-12-06 | Discharge: 2017-12-06 | Disposition: A | Discharge status: Home / Self Care | Payer: Medicare Other | Source: Ambulatory Visit | Attending: Allergy and Immunology | Admitting: Allergy and Immunology

## 2017-12-06 ENCOUNTER — Ambulatory Visit (INDEPENDENT_AMBULATORY_CARE_PROVIDER_SITE_OTHER): Payer: Medicare Other | Admitting: Allergy and Immunology

## 2017-12-06 ENCOUNTER — Telehealth: Payer: Self-pay

## 2017-12-06 ENCOUNTER — Encounter: Payer: Self-pay | Admitting: Allergy and Immunology

## 2017-12-06 VITALS — BP 122/68 | HR 92 | Resp 16 | Ht 62.25 in | Wt 149.0 lb

## 2017-12-06 DIAGNOSIS — R05 Cough: Secondary | ICD-10-CM | POA: Diagnosis not present

## 2017-12-06 DIAGNOSIS — K219 Gastro-esophageal reflux disease without esophagitis: Secondary | ICD-10-CM

## 2017-12-06 DIAGNOSIS — J0110 Acute frontal sinusitis, unspecified: Secondary | ICD-10-CM

## 2017-12-06 DIAGNOSIS — J4541 Moderate persistent asthma with (acute) exacerbation: Secondary | ICD-10-CM

## 2017-12-06 DIAGNOSIS — R059 Cough, unspecified: Secondary | ICD-10-CM

## 2017-12-06 DIAGNOSIS — J45901 Unspecified asthma with (acute) exacerbation: Secondary | ICD-10-CM | POA: Diagnosis not present

## 2017-12-06 MED ORDER — TIOTROPIUM BROMIDE MONOHYDRATE 1.25 MCG/ACT IN AERS: 2.0000 | INHALATION_SPRAY | Freq: Two times a day (BID) | RESPIRATORY_TRACT | 5 refills | Status: DC

## 2017-12-06 MED ORDER — AZELASTINE HCL 0.1 % NA SOLN: 2.0000 | Freq: Two times a day (BID) | NASAL | 5 refills | Status: DC

## 2017-12-06 MED ORDER — TIOTROPIUM BROMIDE MONOHYDRATE 1.25 MCG/ACT IN AERS: 2.0000 | INHALATION_SPRAY | Freq: Every day | RESPIRATORY_TRACT | 5 refills | Status: DC

## 2017-12-06 MED ORDER — UMECLIDINIUM BROMIDE 62.5 MCG/INH IN AEPB: 1.0000 | INHALATION_SPRAY | Freq: Every day | RESPIRATORY_TRACT | 5 refills | Status: DC

## 2017-12-06 MED ORDER — ALBUTEROL SULFATE HFA 108 (90 BASE) MCG/ACT IN AERS: INHALATION_SPRAY | RESPIRATORY_TRACT | 0 refills | Status: DC

## 2017-12-06 MED ORDER — FAMOTIDINE 20 MG PO TABS: 20.0000 mg | ORAL_TABLET | Freq: Two times a day (BID) | ORAL | 5 refills | Status: DC

## 2017-12-06 NOTE — Assessment & Plan Note (Signed)
   Treatment plan as outlined above. 

## 2017-12-06 NOTE — Assessment & Plan Note (Signed)
   If chest x-ray is negative, she will start prednisone (as above).  A prescription has been provided for azelastine nasal spray, 1-2 sprays per nostril 2 times daily for now. Proper nasal spray technique has been discussed and demonstrated.   Nasal saline lavage (NeilMed) has been recommended as needed and prior to medicated nasal sprays along with instructions for proper administration.  For thick post nasal drainage, add guaifenesin [phone] mg (Mucinex)  twice daily as needed with adequate hydration as discussed.  She will contact her office if this problem persists, progresses, or if she becomes febrile.

## 2017-12-06 NOTE — Telephone Encounter (Signed)
Please go with Incruse Ellipta, 1 inhalation daily, during upper respiratory tract infections and asthma flares.

## 2017-12-06 NOTE — Assessment & Plan Note (Addendum)
   A two view chest x-ray has been ordered.  If chest x-ray is positive for infiltrates, appropriate antibiotic therapy will be ordered.  If chest x-ray is negative she will start prednisone, 40 mg x3 days, 20 mg x1 day, 10 mg x1 day, then stop.  Continue Symbicort 160-4.5 g, 2 inhalations twice daily.  To maximize pulmonary deposition, a spacer has been provided along with instructions for its proper administration with an HFA inhaler.  For now, and during respiratory tract infections and asthma flares, add Spiriva Respimat 1.25 g, 2 inhalations daily.  A prescription has been provided.  Continue albuterol HFA, 1 to 2 inhalations every 4-6 hours if needed.  The patient has been asked to contact our office if her symptoms persist or progress. Otherwise, she may return for follow up in 4 months.

## 2017-12-06 NOTE — Telephone Encounter (Signed)
LM for patient to return call to discuss.  Prescription has not been sent yet.

## 2017-12-06 NOTE — Progress Notes (Signed)
Follow-up Note  RE: Susan Li MRN: [id number] DOB: 01/04/1941 Date of Office Visit: 12/06/2017  Primary care provider: Josetta Huddle, MD Referring provider: Josetta Huddle, MD  History of present illness: Susan Li is a 77 y.o. female with persistent asthma and allergic rhinitis presenting today for sick visit.  She was previously seen in this clinic on November 8 by Dr. Nelva Bush.  She reports that she has had no improvement and continues to experience frontal sinus pressure, thick postnasal drainage, nasal congestion, coughing, chest tightness, and wheezing.  She denies fevers, chills, and body aches.  The cough is occasionally productive of mucus which is described as cloudy/yellowish/light green.  During her visit with Dr. Nelva Bush she was started on Augmentin and prednisone.  She reports that she has responded well to azithromycin in the past.  She admits that she has been not been using a nasal spray.  She is currently taking Symbicort 160- 4.5 g, 2 inhalations twice daily.  She currently does not have a spacer device.  She is taking omeprazole daily and believes that her reflux is currently well controlled.  Assessment and plan: Moderate persistent asthma with acute exacerbation  A two view chest x-ray has been ordered.  If chest x-ray is positive for infiltrates, appropriate antibiotic therapy will be ordered.  If chest x-ray is negative she will start prednisone, 40 mg x3 days, 20 mg x1 day, 10 mg x1 day, then stop.  Continue Symbicort 160-4.5 g, 2 inhalations twice daily.  To maximize pulmonary deposition, a spacer has been provided along with instructions for its proper administration with an HFA inhaler.  For now, and during respiratory tract infections and asthma flares, add Spiriva Respimat 1.25 g, 2 inhalations daily.  A prescription has been provided.  Continue albuterol HFA, 1 to 2 inhalations every 4-6 hours if needed.  The patient has been asked to contact our  office if her symptoms persist or progress. Otherwise, she may return for follow up in 4 months.  Acute sinusitis  If chest x-ray is negative, she will start prednisone (as above).  A prescription has been provided for azelastine nasal spray, 1-2 sprays per nostril 2 times daily for now. Proper nasal spray technique has been discussed and demonstrated.   Nasal saline lavage (NeilMed) has been recommended as needed and prior to medicated nasal sprays along with instructions for proper administration.  For thick post nasal drainage, add guaifenesin [phone] mg (Mucinex)  twice daily as needed with adequate hydration as discussed.  She will contact her office if this problem persists, progresses, or if she becomes febrile.  LPRD (laryngopharyngeal reflux disease)  Continue appropriate reflux lifestyle modifications and omeprazole as prescribed.  For now, add famotidine (Pepcid) 20 mg twice daily.  A prescription has been provided.  Cough  Treatment plan as outlined above.   Meds ordered this encounter  Medications  . azelastine (ASTELIN) 0.1 % nasal spray    Sig: Place 2 sprays into both nostrils 2 (two) times daily.    Dispense:  30 mL    Refill:  5  . famotidine (PEPCID) 20 MG tablet    Sig: Take 1 tablet (20 mg total) by mouth 2 (two) times daily.    Dispense:  30 tablet    Refill:  5  . DISCONTD: Tiotropium Bromide Monohydrate (SPIRIVA RESPIMAT) 1.25 MCG/ACT AERS    Sig: Inhale 2 puffs into the lungs 2 (two) times daily.    Dispense:  1 Inhaler  Refill:  5  . albuterol (VENTOLIN HFA) 108 (90 Base) MCG/ACT inhaler    Sig: USE 2 PUFFS EVERY 4 TO 6 HOURS AS NEEDED FOR COUGH/WHEEZING.    Dispense:  18 g    Refill:  0  . Tiotropium Bromide Monohydrate (SPIRIVA RESPIMAT) 1.25 MCG/ACT AERS    Sig: Inhale 2 puffs into the lungs daily.    Dispense:  1 Inhaler    Refill:  5    Diagnostics: Spirometry reveals an FVC of 2.33 L and an FEV1 of 1.77 L (95% predicted) without  significant postbronchodilator improvement.  Please see scanned spirometry results for details.    Physical examination: Blood pressure 122/68, pulse 92, resp. rate 16, height 5' 2.25" (1.581 m), weight 149 lb (67.6 kg), SpO2 97 %.  General: Alert, interactive, in no acute distress. HEENT: TMs pearly gray, turbinates edematous with thick discharge, post-pharynx markedly erythematous. Neck: Supple without lymphadenopathy. Lungs: Clear to auscultation without wheezing, rhonchi or rales. CV: Normal S1, S2 without murmurs. Skin: Warm and dry, without lesions or rashes.  The following portions of the patient's history were reviewed and updated as appropriate: allergies, current medications, past family history, past medical history, past social history, past surgical history and problem list.  Allergies as of 12/06/2017      Reactions   Morphine And Related Itching   Itching nose      Medication List        Accurate as of 12/06/17  9:09 PM. Always use your most recent med list.          albuterol 108 (90 Base) MCG/ACT inhaler Commonly known as:  PROVENTIL HFA;VENTOLIN HFA USE 2 PUFFS EVERY 4 TO 6 HOURS AS NEEDED FOR COUGH/WHEEZING.   ALPRAZolam 0.5 MG tablet Commonly known as:  XANAX 0.25 mg at bedtime.   azelastine 0.1 % nasal spray Commonly known as:  ASTELIN Place 2 sprays into both nostrils 2 (two) times daily.   BIOTIN PO Take 5,000 mg by mouth daily.   budesonide-formoterol 160-4.5 MCG/ACT inhaler Commonly known as:  SYMBICORT USE 2 PUFFS INHALED EVERY 12 HOURS TO PREVENT COUGH OR WHEEZE-RINSE, GARGLE, SPIT AFTER USE.   CALCIUM + D PO Take 1,200 mg by mouth daily.   cetirizine 5 MG tablet Commonly known as:  ZYRTEC Take 5 mg by mouth daily.   escitalopram 10 MG tablet Commonly known as:  LEXAPRO   estradiol 1 MG tablet Commonly known as:  ESTRACE Take 1 mg by mouth daily. Pt takes 0.5 mg   famotidine 20 MG tablet Commonly known as:  PEPCID Take 1  tablet (20 mg total) by mouth 2 (two) times daily.   MUCINEX ALLERGY PO Take by mouth.   omeprazole 20 MG capsule Commonly known as:  PRILOSEC Take 1 capsule (20 mg total) by mouth daily.   Tiotropium Bromide Monohydrate 1.25 MCG/ACT Aers Inhale 2 puffs into the lungs daily.   umeclidinium bromide 62.5 MCG/INH Aepb Commonly known as:  INCRUSE ELLIPTA Inhale 1 puff into the lungs daily.   zolpidem 10 MG tablet Commonly known as:  AMBIEN Take 10 mg by mouth at bedtime as needed for sleep.       Allergies  Allergen Reactions  . Morphine And Related Itching    Itching nose   Review of systems: Review of systems negative except as noted in HPI / PMHx or noted below: Constitutional: Negative.  HENT: Negative.   Eyes: Negative.  Respiratory: Negative.   Cardiovascular: Negative.  Gastrointestinal: Negative.  Genitourinary: Negative.  Musculoskeletal: Negative.  Neurological: Negative.  Endo/Heme/Allergies: Negative.  Cutaneous: Negative.  Past Medical History:  Diagnosis Date  . Arthritis    hips,backs,hands; osteoarthritis  . Asthma    Symbicort as needed 3x week  . Bursitis   . Fibromyalgia   . GERD (gastroesophageal reflux disease)   . Insomnia   . Sciatica of right side   . Vitamin D deficiency     Family History  Problem Relation Age of Onset  . Neuromuscular disorder Neg Hx     Social History   Socioeconomic History  . Marital status: Married    Spouse name: Not on file  . Number of children: Not on file  . Years of education: Not on file  . Highest education level: Not on file  Occupational History  . Not on file  Social Needs  . Financial resource strain: Not on file  . Food insecurity:    Worry: Not on file    Inability: Not on file  . Transportation needs:    Medical: Not on file    Non-medical: Not on file  Tobacco Use  . Smoking status: Never Smoker  . Smokeless tobacco: Never Used  Substance and Sexual Activity  . Alcohol use:  Yes    Alcohol/week: 2.0 standard drinks    Types: 2 Glasses of wine per week  . Drug use: No  . Sexual activity: Not on file  Lifestyle  . Physical activity:    Days per week: Not on file    Minutes per session: Not on file  . Stress: Not on file  Relationships  . Social connections:    Talks on phone: Not on file    Gets together: Not on file    Attends religious service: Not on file    Active member of club or organization: Not on file    Attends meetings of clubs or organizations: Not on file    Relationship status: Not on file  . Intimate partner violence:    Fear of current or ex partner: Not on file    Emotionally abused: Not on file    Physically abused: Not on file    Forced sexual activity: Not on file  Other Topics Concern  . Not on file  Social History Narrative  . Not on file    I appreciate the opportunity to take part in [facility] care. Please do not hesitate to contact me with questions.  Sincerely,   R. Edgar Frisk, MD  ______________________________  Addendum:  Chest xray revealed the following: No active cardiopulmonary disease. No evidence of pneumonia or pulmonary edema.

## 2017-12-06 NOTE — Telephone Encounter (Signed)
Patient notified of prescription change. Incruse sent to the requested pharmacy. Patient will call back with any questions or concerns.

## 2017-12-06 NOTE — Assessment & Plan Note (Signed)
   Continue appropriate reflux lifestyle modifications and omeprazole as prescribed.  For now, add famotidine (Pepcid) 20 mg twice daily.  A prescription has been provided.

## 2017-12-06 NOTE — Patient Instructions (Addendum)
Moderate persistent asthma with acute exacerbation  A two view chest x-ray has been ordered.  If chest x-ray is positive for infiltrates, appropriate antibiotic therapy will be ordered.  If chest x-ray is negative she will start prednisone, 40 mg x3 days, 20 mg x1 day, 10 mg x1 day, then stop.  Continue Symbicort 160-4.5 g, 2 inhalations twice daily.  To maximize pulmonary deposition, a spacer has been provided along with instructions for its proper administration with an HFA inhaler.  For now, and during respiratory tract infections and asthma flares, add Spiriva Respimat 1.25 g, 2 inhalations daily.  A prescription has been provided.  Continue albuterol HFA, 1 to 2 inhalations every 4-6 hours if needed.  The patient has been asked to contact our office if her symptoms persist or progress. Otherwise, she may return for follow up in 4 months.  Acute sinusitis  If chest x-ray is negative, she will start prednisone (as above).  A prescription has been provided for azelastine nasal spray, 1-2 sprays per nostril 2 times daily for now. Proper nasal spray technique has been discussed and demonstrated.   Nasal saline lavage (NeilMed) has been recommended as needed and prior to medicated nasal sprays along with instructions for proper administration.  For thick post nasal drainage, add guaifenesin [phone] mg (Mucinex)  twice daily as needed with adequate hydration as discussed.  She will contact her office if this problem persists, progresses, or if she becomes febrile.  LPRD (laryngopharyngeal reflux disease)  Continue appropriate reflux lifestyle modifications and omeprazole as prescribed.  For now, add famotidine (Pepcid) 20 mg twice daily.  A prescription has been provided.  Cough  Treatment plan as outlined above.   Return in about 4 months (around 04/06/2018), or if symptoms worsen or fail to improve.

## 2017-12-06 NOTE — Telephone Encounter (Signed)
Spiriva inhaler not covered under patient's plan.  Per insurance, formulary alternatives are Atrovent HFA and Incruse Elipta.    Please advise. Patient was given a sample of Spiriva while in the office today.

## 2017-12-21 ENCOUNTER — Encounter: Payer: Self-pay | Admitting: Allergy and Immunology

## 2017-12-21 ENCOUNTER — Ambulatory Visit (INDEPENDENT_AMBULATORY_CARE_PROVIDER_SITE_OTHER): Payer: Medicare Other | Admitting: Allergy and Immunology

## 2017-12-21 VITALS — BP 130/86 | HR 89 | Resp 16 | Ht 63.5 in | Wt 149.0 lb

## 2017-12-21 DIAGNOSIS — J3089 Other allergic rhinitis: Secondary | ICD-10-CM

## 2017-12-21 DIAGNOSIS — K219 Gastro-esophageal reflux disease without esophagitis: Secondary | ICD-10-CM

## 2017-12-21 DIAGNOSIS — J4540 Moderate persistent asthma, uncomplicated: Secondary | ICD-10-CM | POA: Diagnosis not present

## 2017-12-21 NOTE — Progress Notes (Signed)
Follow-up Note  Referring Provider: Josetta Huddle, MD Primary Provider: Josetta Huddle, MD Date of Office Visit: 12/21/2017  Subjective:   Susan Li (DOB: 1940/11/27) is a 77 y.o. female who returns to the [facility] on 12/21/2017 in re-evaluation of the following:  HPI: Susan Li returns to this clinic in reevaluation of her asthma and allergic rhinitis and reflux.  I last saw her in this clinic 04 August 2017 but she has seen Dr. Nelva Bush on 19 November 2017 and Dr. Verlin Fester on 06 December 2017.  Apparently she was doing very well with her asthma and her allergic rhinitis without any significant issues until the very beginning of November at which time she developed sneezing attacks quickly followed by wheezing and coughing and congestion and this has been a month-long event requiring multiple courses of prednisone and Augmentin and consistent use of her anti-inflammatory agents for her airway.  Fortunately, it is over the course of the past 48 hours that she has really starting to feel much better.  She never had any high fever with this event or ugly nasal discharge or sputum production or chest pain.  Prior to this event she rarely had any significant respiratory tract issues and she did not require systemic steroid or an antibiotic to treat any type of respiratory tract problem and she rarely used her short acting bronchodilator and could exercise without any difficulty.  Her reflux has been doing very well while consistently using omeprazole and occasionally an H2 receptor blocker.  She did receive the flu vaccine this year.  Allergies as of 12/21/2017      Reactions   Morphine And Related Itching   Itching nose      Medication List      albuterol 108 (90 Base) MCG/ACT inhaler Commonly known as:  PROVENTIL HFA;VENTOLIN HFA USE 2 PUFFS EVERY 4 TO 6 HOURS AS NEEDED FOR COUGH/WHEEZING.   ALPRAZolam 0.5 MG tablet Commonly known as:  XANAX 0.25 mg at bedtime.     azelastine 0.1 % nasal spray Commonly known as:  ASTELIN Place 2 sprays into both nostrils 2 (two) times daily.   BIOTIN PO Take 5,000 mg by mouth daily.   budesonide-formoterol 160-4.5 MCG/ACT inhaler Commonly known as:  SYMBICORT USE 2 PUFFS INHALED EVERY 12 HOURS TO PREVENT COUGH OR WHEEZE-RINSE, GARGLE, SPIT AFTER USE.   CALCIUM + D PO Take 1,200 mg by mouth daily.   cetirizine 5 MG tablet Commonly known as:  ZYRTEC Take 5 mg by mouth daily.   escitalopram 10 MG tablet Commonly known as:  LEXAPRO   estradiol 1 MG tablet Commonly known as:  ESTRACE Take 1 mg by mouth daily. Pt takes 0.5 mg   famotidine 20 MG tablet Commonly known as:  PEPCID Take 1 tablet (20 mg total) by mouth 2 (two) times daily.   MUCINEX ALLERGY PO Take by mouth.   omeprazole 20 MG capsule Commonly known as:  PRILOSEC Take 1 capsule (20 mg total) by mouth daily.   Tiotropium Bromide Monohydrate 1.25 MCG/ACT Aers Inhale 2 puffs into the lungs daily.   umeclidinium bromide 62.5 MCG/INH Aepb Commonly known as:  INCRUSE ELLIPTA Inhale 1 puff into the lungs daily.   zolpidem 10 MG tablet Commonly known as:  AMBIEN Take 10 mg by mouth at bedtime as needed for sleep.       Past Medical History:  Diagnosis Date  . Arthritis    hips,backs,hands; osteoarthritis  . Asthma  Symbicort as needed 3x week  . Bursitis   . Fibromyalgia   . GERD (gastroesophageal reflux disease)   . Insomnia   . Sciatica of right side   . Vitamin D deficiency     Past Surgical History:  Procedure Laterality Date  . ABDOMINAL HYSTERECTOMY  2000  . Buninonectomy  2008  . [place] SURGERY  2004  . COLONOSCOPY WITH PROPOFOL N/A 04/25/2012   Procedure: COLONOSCOPY WITH PROPOFOL;  Surgeon: Garlan Fair, MD;  Location: [facility];  Service: Endoscopy;  Laterality: N/A;  . LASIK  2004  . left shoulder surgery    . RESECTION DISTAL CLAVICAL Left 08/24/2014   Procedure: RESECTION DISTAL CLAVICAL;   Surgeon: Melrose Nakayama, MD;  Location: [facility];  Service: Orthopedics;  Laterality: Left;  . SHOULDER ARTHROSCOPY WITH SUBACROMIAL DECOMPRESSION Left 08/24/2014   Procedure: ARTHROSCOPY  LEFT SHOULDER, SUBACROMIAL DECOMPRESSION, DISTAL CLAVICLE RESECTION AND DEBRIDEMENT;  Surgeon: Melrose Nakayama, MD;  Location: [facility];  Service: Orthopedics;  Laterality: Left;  . Tendernitis Surgery  99    Review of systems negative except as noted in HPI / PMHx or noted below:  Review of Systems  Constitutional: Negative.   HENT: Negative.   Eyes: Negative.   Respiratory: Negative.   Cardiovascular: Negative.   Gastrointestinal: Negative.   Genitourinary: Negative.   Musculoskeletal: Negative.   Skin: Negative.   Neurological: Negative.   Endo/Heme/Allergies: Negative.   Psychiatric/Behavioral: Negative.      Objective:   Vitals:   12/21/17 1732  BP: 130/86  Pulse: 89  Resp: 16  SpO2: 98%   Height: 5' 3.5" (161.3 cm)  Weight: 149 lb (67.6 kg)   Physical Exam  HENT:  Head: Normocephalic.  Right Ear: Tympanic membrane, external ear and ear canal normal.  Left Ear: Tympanic membrane, external ear and ear canal normal.  Nose: Nose normal. No mucosal edema or rhinorrhea.  Mouth/Throat: Uvula is midline, oropharynx is clear and moist and mucous membranes are normal. No oropharyngeal exudate.  Eyes: Conjunctivae are normal.  Neck: Trachea normal. No tracheal tenderness present. No tracheal deviation present. No thyromegaly present.  Cardiovascular: Normal rate, regular rhythm, S1 normal, S2 normal and normal heart sounds.  No murmur heard. Pulmonary/Chest: Breath sounds normal. No stridor. No respiratory distress. She has no wheezes. She has no rales.  Musculoskeletal: She exhibits no edema.  Lymphadenopathy:       Head (right side): No tonsillar adenopathy present.       Head (left side): No tonsillar adenopathy present.    She has no cervical  adenopathy.  Neurological: She is alert.  Skin: No rash noted. She is not diaphoretic. No erythema. Nails show no clubbing.    Diagnostics:    Spirometry was performed and demonstrated an FEV1 of 1.73 at 87 % of predicted.  Assessment and Plan:   1. Asthma, moderate persistent, well-controlled   2. Other allergic rhinitis   3. LPRD (laryngopharyngeal reflux disease)     1. Continue Symbicort 160 mcg  2 puffs 2 times a day   2. Continue Incruse one inhalation one time per day  3. Can add Asmanex 100 2 inhalations 2 times per day if asthma flare up      4. Continue omeprazole 20 mg in morning. Can add famotidine 20 mg in evening if needed  5. Continue OTC Zyrtec 1 time per day if needed  6. Continue ProAir HFA 2 puffs every 4-6 hours if needed  7. Return  to clinic in 3 months or earlier if problem  It does appear as though Xaviera finally resolved her prolonged viral induced respiratory tract flare with a large collection of medical therapy including 2 rounds of systemic steroids administered from this clinic.  She will now continue to utilize a plan of action directed against inflammation as noted above in a preventative mode and also continue to treat her reflux.  I will see her back in this clinic in 3 months or earlier if there is a problem.  Allena Katz, MD Allergy / Immunology [place]

## 2017-12-21 NOTE — Patient Instructions (Addendum)
  1. Continue Symbicort 160 mcg  2 puffs 2 times a day   2. Continue Incruse one inhalation one time per day  3. Can add Asmanex 100 2 inhalations 2 times per day if asthma flare up      4. Continue omeprazole 20 mg in morning. Can add famotidine 20 mg in evening if needed  5. Continue OTC Zyrtec 1 time per day if needed  6. Continue ProAir HFA 2 puffs every 4-6 hours if needed  7. Return to clinic in 3 months or earlier if problem

## 2017-12-22 ENCOUNTER — Encounter: Payer: Self-pay | Admitting: Allergy and Immunology

## 2018-01-07 ENCOUNTER — Other Ambulatory Visit: Payer: Self-pay | Admitting: Allergy and Immunology

## 2018-01-17 DIAGNOSIS — Z1231 Encounter for screening mammogram for malignant neoplasm of breast: Secondary | ICD-10-CM | POA: Diagnosis not present

## 2018-02-01 ENCOUNTER — Encounter: Payer: Self-pay | Admitting: Allergy and Immunology

## 2018-02-01 ENCOUNTER — Ambulatory Visit (INDEPENDENT_AMBULATORY_CARE_PROVIDER_SITE_OTHER): Payer: Medicare Other | Admitting: Allergy and Immunology

## 2018-02-01 VITALS — BP 112/70 | HR 82 | Resp 18

## 2018-02-01 DIAGNOSIS — K219 Gastro-esophageal reflux disease without esophagitis: Secondary | ICD-10-CM

## 2018-02-01 DIAGNOSIS — J3089 Other allergic rhinitis: Secondary | ICD-10-CM | POA: Diagnosis not present

## 2018-02-01 DIAGNOSIS — J4540 Moderate persistent asthma, uncomplicated: Secondary | ICD-10-CM | POA: Diagnosis not present

## 2018-02-01 NOTE — Patient Instructions (Addendum)
  1. Continue Symbicort 160 mcg  2 puffs 2 times a day   2. Restart Incruse one inhalation one time per day  3. Can add Asmanex 100 2 inhalations 2 times per day if asthma flare up      4. Continue omeprazole 20 mg in morning. Can add famotidine 20 mg in evening if needed  5. Continue OTC Zyrtec 1 time per day if needed  6. Continue ProAir HFA 2 puffs every 4-6 hours if needed  7. Return to clinic in 3 months or earlier if problem

## 2018-02-01 NOTE — Progress Notes (Signed)
Follow-up Note  Referring Provider: Josetta Huddle, MD Primary Provider: Josetta Huddle, MD Date of Office Visit: 02/01/2018  Subjective:   Susan Li (DOB: Jan 07, 1941) is a 78 y.o. female who returns to the [facility] on 02/01/2018 in re-evaluation of the following:  HPI: Susan Li returns to this clinic in reevaluation of her asthma and allergic rhinitis and reflux.  I last saw her in this clinic on 21 December 2017 at which point in time she was doing quite well after sustaining a prolonged episode of respiratory tract inflammation requiring several courses of systemic steroids.  She continued to do well until she ran out of her Incruse inhaler.  When doing so she then developed some more problems with shortness of breath and some wheezing and had to use her bronchodilator about 1 time per day.  She did great when she used a combination of Symbicort and Incruse.  Her reflux has been under very good control at this point while using omeprazole and occasionally famotidine.  She has had very little issues with her nose at this point.  She has been having issues with tinnitus which has been a longstanding issue of many years duration for which she saw Dr. Thornell Mule in the past.  She is wondering if there is a therapy for this tinnitus.  Allergies as of 02/01/2018      Reactions   Morphine And Related Itching   Itching nose      Medication List      albuterol 108 (90 Base) MCG/ACT inhaler Commonly known as:  VENTOLIN HFA USE 2 PUFFS EVERY 4 TO 6 HOURS AS NEEDED FOR COUGH/WHEEZING.   ALPRAZolam 0.5 MG tablet Commonly known as:  XANAX 0.25 mg at bedtime.   azelastine 0.1 % nasal spray Commonly known as:  ASTELIN Place 2 sprays into both nostrils 2 (two) times daily.   BIOTIN PO Take 5,000 mg by mouth daily.   budesonide-formoterol 160-4.5 MCG/ACT inhaler Commonly known as:  SYMBICORT USE 2 PUFFS INHALED EVERY 12 HOURS TO PREVENT COUGH OR WHEEZE-RINSE, GARGLE, SPIT  AFTER USE.   CALCIUM + D PO Take 1,200 mg by mouth daily.   cetirizine 5 MG tablet Commonly known as:  ZYRTEC Take 5 mg by mouth daily.   estradiol 1 MG tablet Commonly known as:  ESTRACE Take 1 mg by mouth daily. Pt takes 0.5 mg   famotidine 20 MG tablet Commonly known as:  PEPCID Take 1 tablet (20 mg total) by mouth 2 (two) times daily.   MUCINEX ALLERGY PO Take by mouth.   omeprazole 20 MG capsule Commonly known as:  PRILOSEC Take 1 capsule (20 mg total) by mouth daily.   umeclidinium bromide 62.5 MCG/INH Aepb Commonly known as:  INCRUSE ELLIPTA Inhale 1 puff into the lungs daily.   zolpidem 10 MG tablet Commonly known as:  AMBIEN Take 10 mg by mouth at bedtime as needed for sleep.       Past Medical History:  Diagnosis Date  . Arthritis    hips,backs,hands; osteoarthritis  . Asthma    Symbicort as needed 3x week  . Bursitis   . Fibromyalgia   . GERD (gastroesophageal reflux disease)   . Insomnia   . Sciatica of right side   . Vitamin D deficiency     Past Surgical History:  Procedure Laterality Date  . ABDOMINAL HYSTERECTOMY  2000  . Buninonectomy  2008  . [place] SURGERY  2004  . COLONOSCOPY WITH PROPOFOL  N/A 04/25/2012   Procedure: COLONOSCOPY WITH PROPOFOL;  Surgeon: Garlan Fair, MD;  Location: [facility];  Service: Endoscopy;  Laterality: N/A;  . LASIK  2004  . left shoulder surgery    . RESECTION DISTAL CLAVICAL Left 08/24/2014   Procedure: RESECTION DISTAL CLAVICAL;  Surgeon: Melrose Nakayama, MD;  Location: [facility];  Service: Orthopedics;  Laterality: Left;  . SHOULDER ARTHROSCOPY WITH SUBACROMIAL DECOMPRESSION Left 08/24/2014   Procedure: ARTHROSCOPY  LEFT SHOULDER, SUBACROMIAL DECOMPRESSION, DISTAL CLAVICLE RESECTION AND DEBRIDEMENT;  Surgeon: Melrose Nakayama, MD;  Location: [facility];  Service: Orthopedics;  Laterality: Left;  . Tendernitis Surgery  99    Review of systems negative except as noted  in HPI / PMHx or noted below:  Review of Systems  Constitutional: Negative.   HENT: Negative.   Eyes: Negative.   Respiratory: Negative.   Cardiovascular: Negative.   Gastrointestinal: Negative.   Genitourinary: Negative.   Musculoskeletal: Negative.   Skin: Negative.   Neurological: Negative.   Endo/Heme/Allergies: Negative.   Psychiatric/Behavioral: Negative.      Objective:   Vitals:   02/01/18 1519  BP: 112/70  Pulse: 82  Resp: 18  SpO2: 98%          Physical Exam Constitutional:      Appearance: She is not diaphoretic.  HENT:     Head: Normocephalic.     Right Ear: Tympanic membrane, ear canal and external ear normal.     Left Ear: Tympanic membrane, ear canal and external ear normal.     Nose: Nose normal. No mucosal edema or rhinorrhea.     Mouth/Throat:     Pharynx: Uvula midline. No oropharyngeal exudate.  Eyes:     Conjunctiva/sclera: Conjunctivae normal.  Neck:     Thyroid: No thyromegaly.     Trachea: Trachea normal. No tracheal tenderness or tracheal deviation.  Cardiovascular:     Rate and Rhythm: Normal rate and regular rhythm.     Heart sounds: Normal heart sounds, S1 normal and S2 normal. No murmur.  Pulmonary:     Effort: No respiratory distress.     Breath sounds: Normal breath sounds. No stridor. No wheezing or rales.  Lymphadenopathy:     Head:     Right side of head: No tonsillar adenopathy.     Left side of head: No tonsillar adenopathy.     Cervical: No cervical adenopathy.  Skin:    Findings: No erythema or rash.     Nails: There is no clubbing.   Neurological:     Mental Status: She is alert.     Diagnostics:    Spirometry was performed and demonstrated an FEV1 of 1.74 at 88 % of predicted.  The patient had an Asthma Control Test with the following results: ACT Total Score: 16.    Assessment and Plan:   1. Not well controlled moderate persistent asthma   2. Other allergic rhinitis   3. LPRD (laryngopharyngeal reflux  disease)     1. Continue Symbicort 160 mcg  2 puffs 2 times a day   2. Restart Incruse one inhalation one time per day  3. Can add Asmanex 100 2 inhalations 2 times per day if asthma flare up      4. Continue omeprazole 20 mg in morning. Can add famotidine 20 mg in evening if needed  5. Continue OTC Zyrtec 1 time per day if needed  6. Continue ProAir HFA 2 puffs every 4-6 hours if needed  7. Return to clinic in 3 months or earlier if problem  Susan Li appears to have redeveloped some respiratory tract symptoms after she eliminated use of Incruse and I have asked her to restart this medication while she continues on Symbicort.  If she does not do well with this plan of action then we will need to move onto a different form of therapy.  Her reflux appears to be doing very well on her current plan.  She did discuss tinnitus with me today and I did recommend that she google hearing aids specifically set up for tinnitus therapy and may be making an attempt to try 1 of these devices to see if she can block out the specific frequency of tinnitus that exists.  If she does well I will see her back in this clinic in 3 months or earlier if there is a problem.  Susan Katz, MD Allergy / Immunology [facility]

## 2018-02-02 ENCOUNTER — Encounter: Payer: Self-pay | Admitting: Allergy and Immunology

## 2018-03-21 DIAGNOSIS — R110 Nausea: Secondary | ICD-10-CM | POA: Diagnosis not present

## 2018-03-21 DIAGNOSIS — J309 Allergic rhinitis, unspecified: Secondary | ICD-10-CM | POA: Diagnosis not present

## 2018-03-21 DIAGNOSIS — R197 Diarrhea, unspecified: Secondary | ICD-10-CM | POA: Diagnosis not present

## 2018-03-21 DIAGNOSIS — R067 Sneezing: Secondary | ICD-10-CM | POA: Diagnosis not present

## 2018-03-28 DIAGNOSIS — Z79899 Other long term (current) drug therapy: Secondary | ICD-10-CM | POA: Diagnosis not present

## 2018-03-28 DIAGNOSIS — F4321 Adjustment disorder with depressed mood: Secondary | ICD-10-CM | POA: Diagnosis not present

## 2018-03-28 DIAGNOSIS — R5383 Other fatigue: Secondary | ICD-10-CM | POA: Diagnosis not present

## 2018-03-28 DIAGNOSIS — R197 Diarrhea, unspecified: Secondary | ICD-10-CM | POA: Diagnosis not present

## 2018-03-28 DIAGNOSIS — Z658 Other specified problems related to psychosocial circumstances: Secondary | ICD-10-CM | POA: Diagnosis not present

## 2018-03-28 DIAGNOSIS — R000 Tachycardia, unspecified: Secondary | ICD-10-CM | POA: Diagnosis not present

## 2018-03-28 DIAGNOSIS — R110 Nausea: Secondary | ICD-10-CM | POA: Diagnosis not present

## 2018-03-28 DIAGNOSIS — F324 Major depressive disorder, single episode, in partial remission: Secondary | ICD-10-CM | POA: Diagnosis not present

## 2018-03-28 DIAGNOSIS — J4520 Mild intermittent asthma, uncomplicated: Secondary | ICD-10-CM | POA: Diagnosis not present

## 2018-05-10 ENCOUNTER — Ambulatory Visit: Payer: Medicare Other | Admitting: Allergy and Immunology

## 2018-06-13 ENCOUNTER — Telehealth: Payer: Self-pay | Admitting: *Deleted

## 2018-06-13 ENCOUNTER — Encounter: Payer: Self-pay | Admitting: *Deleted

## 2018-06-13 NOTE — Telephone Encounter (Signed)
Spoke with pt to update her chart in preparation for the virtual visit pt has scheduled for Wed 6/3 @ 10:30 AM. Pt confirmed name & DOB and updated chart including meds, allergies, history. She confirmed she had received the email with link already. She understands she will receive a call at 10:00 AM on Wed to check-in. Pt understands she can use her ipad or laptop with camera. Pt stated the reason for the appt is due to twitching in the L arm multiple times a day. She looks at her arm when she feels it but cannot see anything. She verbalized appreciation for the call.

## 2018-06-15 ENCOUNTER — Ambulatory Visit (INDEPENDENT_AMBULATORY_CARE_PROVIDER_SITE_OTHER): Payer: Medicare Other | Admitting: Neurology

## 2018-06-15 ENCOUNTER — Other Ambulatory Visit: Payer: Self-pay

## 2018-06-15 DIAGNOSIS — M5412 Radiculopathy, cervical region: Secondary | ICD-10-CM

## 2018-06-15 MED ORDER — GABAPENTIN 100 MG PO CAPS: 100.0000 mg | ORAL_CAPSULE | Freq: Three times a day (TID) | ORAL | 6 refills | Status: DC

## 2018-06-15 NOTE — Patient Instructions (Signed)
Gabapentin capsules or tablets What is this medicine? GABAPENTIN (GA ba pen tin) is used to control seizures in certain types of epilepsy. It is also used to treat certain types of nerve pain. This medicine may be used for other purposes; ask your health care provider or pharmacist if you have questions. COMMON BRAND NAME(S): Active-PAC with Gabapentin, Gabarone, Neurontin What should I tell my health care provider before I take this medicine? They need to know if you have any of these conditions: -kidney disease -suicidal thoughts, plans, or attempt; a previous suicide attempt by you or a family member -an unusual or allergic reaction to gabapentin, other medicines, foods, dyes, or preservatives -pregnant or trying to get pregnant -breast-feeding How should I use this medicine? Take this medicine by mouth with a glass of water. Follow the directions on the prescription label. You can take it with or without food. If it upsets your stomach, take it with food. Take your medicine at regular intervals. Do not take it more often than directed. Do not stop taking except on your doctor's advice. If you are directed to break the 600 or 800 mg tablets in half as part of your dose, the extra half tablet should be used for the next dose. If you have not used the extra half tablet within 28 days, it should be thrown away. A special MedGuide will be given to you by the pharmacist with each prescription and refill. Be sure to read this information carefully each time. Talk to your pediatrician regarding the use of this medicine in children. While this drug may be prescribed for children as young as 3 years for selected conditions, precautions do apply. Overdosage: If you think you have taken too much of this medicine contact a poison control center or emergency room at once. NOTE: This medicine is only for you. Do not share this medicine with others. What if I miss a dose? If you miss a dose, take it as soon  as you can. If it is almost time for your next dose, take only that dose. Do not take double or extra doses. What may interact with this medicine? Do not take this medicine with any of the following medications: -other gabapentin products This medicine may also interact with the following medications: -alcohol -antacids -antihistamines for allergy, cough and cold -certain medicines for anxiety or sleep -certain medicines for depression or psychotic disturbances -homatropine; hydrocodone -naproxen -narcotic medicines (opiates) for pain -phenothiazines like chlorpromazine, mesoridazine, prochlorperazine, thioridazine This list may not describe all possible interactions. Give your health care provider a list of all the medicines, herbs, non-prescription drugs, or dietary supplements you use. Also tell them if you smoke, drink alcohol, or use illegal drugs. Some items may interact with your medicine. What should I watch for while using this medicine? Visit your doctor or health care professional for regular checks on your progress. You may want to keep a record at home of how you feel your condition is responding to treatment. You may want to share this information with your doctor or health care professional at each visit. You should contact your doctor or health care professional if your seizures get worse or if you have any new types of seizures. Do not stop taking this medicine or any of your seizure medicines unless instructed by your doctor or health care professional. Stopping your medicine suddenly can increase your seizures or their severity. Wear a medical identification bracelet or chain if you are taking this medicine for   seizures, and carry a card that lists all your medications. You may get drowsy, dizzy, or have blurred vision. Do not drive, use machinery, or do anything that needs mental alertness until you know how this medicine affects you. To reduce dizzy or fainting spells, do not  sit or stand up quickly, especially if you are an older patient. Alcohol can increase drowsiness and dizziness. Avoid alcoholic drinks. Your mouth may get dry. Chewing sugarless gum or sucking hard candy, and drinking plenty of water will help. The use of this medicine may increase the chance of suicidal thoughts or actions. Pay special attention to how you are responding while on this medicine. Any worsening of mood, or thoughts of suicide or dying should be reported to your health care professional right away. Women who become pregnant while using this medicine may enroll in the [place] Antiepileptic Drug Pregnancy Registry by calling 1-888-233-2334. This registry collects information about the safety of antiepileptic drug use during pregnancy. What side effects may I notice from receiving this medicine? Side effects that you should report to your doctor or health care professional as soon as possible: -allergic reactions like skin rash, itching or hives, swelling of the face, lips, or tongue -worsening of mood, thoughts or actions of suicide or dying Side effects that usually do not require medical attention (report to your doctor or health care professional if they continue or are bothersome): -constipation -difficulty walking or controlling muscle movements -dizziness -nausea -slurred speech -tiredness -tremors -weight gain This list may not describe all possible side effects. Call your doctor for medical advice about side effects. You may report side effects to FDA at 1-800-FDA-1088. Where should I keep my medicine? Keep out of reach of children. This medicine may cause accidental overdose and death if it taken by other adults, children, or pets. Mix any unused medicine with a substance like cat litter or coffee grounds. Then throw the medicine away in a sealed container like a sealed bag or a coffee can with a lid. Do not use the medicine after the expiration date. Store at room  temperature between 15 and 30 degrees C (59 and 86 degrees F). NOTE: This sheet is a summary. It may not cover all possible information. If you have questions about this medicine, talk to your doctor, pharmacist, or health care provider.  2019 Elsevier/Gold Standard (2017-06-03 13:21:44)  

## 2018-06-15 NOTE — Progress Notes (Signed)
[facility]    Provider:  Dr Jaynee Eagles Referring Provider: Josetta Huddle, MD Primary Care Physician:  Josetta Huddle, MD  CC:  Paresthesias of left arm  Virtual Visit via Video Note  I connected with Susan Li on 06/15/18 at 10:30 AM EDT by a video enabled telemedicine application and verified that I am speaking with the correct person using two identifiers.  Location: Patient: Home Provider: office    I discussed the limitations of evaluation and management by telemedicine and the availability of in person appointments. The patient expressed understanding and agreed to proceed.  History of Present Illness:    Observations/Objective:   Assessment and Plan:   Follow Up Instructions:    I discussed the assessment and treatment plan with the patient. The patient was provided an opportunity to ask questions and all were answered. The patient agreed with the plan and demonstrated an understanding of the instructions.   The patient was advised to call back or seek an in-person evaluation if the symptoms worsen or if the condition fails to improve as anticipated.  I provided *25 minutes of non-face-to-face time during this encounter.   Melvenia Beam, MD    Interval history 06/15/2018: Patient here for follow-up of paresthesias when I initially saw her in late 2016-04-26 her husband had died of ALS and she was very worried about having it herself.  EMG nerve conduction study was normal.  MRI of the cervical spine showed no cervical cord pathology but multi-level foraminal stenoses.  I reassured patient.  She continues to have the sensations in her arm, radiating from the upper arm, she has a hx of chronic neck pain and surgeries. She has had ESI with Dr. Nelva Bush for ESI in the past. We discussed treatment and she would like to start Gabapentin.    Reviewed MRi images of the cervical spine and agree with the following : Abnormal MRI scan of the cervical spine  showing postoperative changes of anterior metal plate screws fixation  from C5-C7 with prominent kyphotic deformity and disc osteophyte complex bulge at C4-5 but no significant compression.  There are also mild degenerative changes at  C5-6 and C7-T1 without significant compression.  There is stable small perineural cyst at C6-7, C7-T1    Overall no significant change compared with previous MRI scan dated 01/28/2012.   HPI:  Susan Li is a 78 y.o. female here as a referral from Dr. Inda Merlin for paresthesias of left arm. PMHx Sciatica, insomnia, Fibromyalgia, Asthma, Arthritis, cervical degenerative disease. Her husband died of ALS recently and she has had a difficult time. She has tingling from her shoulder to her left thumb. She also has weakness in her left arm. Started a year ago. The left hand is weak when she picks things up. Slowly worsening. She had surgery on her shoulder. She sees Dr. Nelva Bush for back pain and steroid injections. No recent MRI cervical spine. Steroid injections in the neck and shoulder may have helped. No triggers, nothing makes it better. Daily tingling and progressive weakness left arm. Right is unaffected. No other focal neurologic deficits, associated symptoms, inciting events or modifiable factors.   Reviewed notes, labs and imaging from outside physicians, which showed:  TSH WNL CBC/CMP unremarkable with cr 0.66 and bun 18 12/01/2016   MRI cervical spine 01/2012: personally reviewed and agree with findings below. Findings: The craniocervical junction appears unremarkable.  Plate and screw fixator at C5 - C6-C7 causes some local distortion. Mild kyphotic angulation noted  at C4-5 with suspicion for mild posterior degenerative subluxation at the C4-5 level.  No significant abnormal cord signal is identified.  Degenerative spurring noted eccentric to the right of the C1-2 articulation.  Degenerative endplate changes and possible Schmorl's node noted along the  inferior endplate of C4. No significant abnormal enhancement noted.  Additional findings at individual levels are as follows:  C2-3: No impingement.  There is 1 mm of anterior subluxation of C2 on C3.  Stable small central disc protrusion.  C3-4:  No impingement.  Central annular tear.  C4-5:  Mild anterior flattening of the cord due to disc osteophyte complex, although there is adequate CSF space posterior to the cord.  Borderline foraminal stenosis.  C5-6:  Borderline left foraminal stenosis due to facet and intervertebral spurring.  C6-7:  No impingement.  Small bilateral perineural cysts.  C7-T1:  Borderline left foraminal stenosis due to facet arthropathy.  Stable 2 mm anterolisthesis, believed to be degenerative.  Right perineural cyst.  T1-2:  Stable appearance of somewhat prominent left perineural cyst, only partially included on today's exam.  IMPRESSION:  1.  There is mild anterior flattening of the cord at C4-5 due to disc osteophyte complex, although there is CSF space posterior to the cord at this level. 2.  Borderline foraminal impingement at C4-5, C5-6, and C7-T1. 3.  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  Perineural cysts are only rarely a cause for symptoms.   Review of Systems: Patient complains of symptoms per HPI as well as the following symptoms" left arm tingling. Pertinent negatives and positives per HPI. All others negative.   Social History   Socioeconomic History  . Marital status: Widowed    Spouse name: Not on file  . Number of children: Not on file  . Years of education: Not on file  . Highest education level: Not on file  Occupational History  . Not on file  Social Needs  . Financial resource strain: Not on file  . Food insecurity:    Worry: Not on file    Inability: Not on file  . Transportation needs:    Medical: Not on file    Non-medical: Not on file   Tobacco Use  . Smoking status: Never Smoker  . Smokeless tobacco: Never Used  Substance and Sexual Activity  . Alcohol use: Yes    Alcohol/week: 14.0 standard drinks    Types: 14 Glasses of wine per week  . Drug use: No  . Sexual activity: Not on file  Lifestyle  . Physical activity:    Days per week: Not on file    Minutes per session: Not on file  . Stress: Not on file  Relationships  . Social connections:    Talks on phone: Not on file    Gets together: Not on file    Attends religious service: Not on file    Active member of club or organization: Not on file    Attends meetings of clubs or organizations: Not on file    Relationship status: Not on file  . Intimate partner violence:    Fear of current or ex partner: Not on file    Emotionally abused: Not on file    Physically abused: Not on file    Forced sexual activity: Not on file  Other Topics Concern  . Not on file  Social History Narrative  . Not on file    Family History  Problem Relation Age of Onset  . Neuromuscular disorder Neg Hx     Past Medical History:  Diagnosis Date  . Arthritis    hips,backs,hands; osteoarthritis  . Asthma    Symbicort as needed 3x week  . Bursitis   . Fibromyalgia   . GERD (gastroesophageal reflux disease)   . Insomnia   . Sciatica of right side   . Vitamin D deficiency     Past Surgical History:  Procedure Laterality Date  . ABDOMINAL HYSTERECTOMY  2000  . Buninonectomy  2008  . [place] SURGERY  2004  . COLONOSCOPY WITH PROPOFOL N/A 04/25/2012   Procedure: COLONOSCOPY WITH PROPOFOL;  Surgeon: Garlan Fair, MD;  Location: [facility];  Service: Endoscopy;  Laterality: N/A;  . LASIK  2004  . left shoulder surgery    . RESECTION DISTAL CLAVICAL Left 08/24/2014   Procedure: RESECTION DISTAL CLAVICAL;  Surgeon: Melrose Nakayama, MD;  Location: [facility];  Service: Orthopedics;  Laterality: Left;  . SHOULDER ARTHROSCOPY WITH SUBACROMIAL  DECOMPRESSION Left 08/24/2014   Procedure: ARTHROSCOPY  LEFT SHOULDER, SUBACROMIAL DECOMPRESSION, DISTAL CLAVICLE RESECTION AND DEBRIDEMENT;  Surgeon: Melrose Nakayama, MD;  Location: [facility];  Service: Orthopedics;  Laterality: Left;  . Tendernitis Surgery  99    Current Outpatient Medications  Medication Sig Dispense Refill  . albuterol (VENTOLIN HFA) 108 (90 Base) MCG/ACT inhaler USE 2 PUFFS EVERY 4 TO 6 HOURS AS NEEDED FOR COUGH/WHEEZING. 18 g 0  . ALPRAZolam (XANAX) 0.5 MG tablet 0.5 mg at bedtime.     Marland Kitchen azelastine (ASTELIN) 0.1 % nasal spray Place 2 sprays into both nostrils 2 (two) times daily. (Patient not taking: Reported on 02/01/2018) 30 mL 5  . BIOTIN PO Take 5,000 mg by mouth daily.     . budesonide-formoterol (SYMBICORT) 160-4.5 MCG/ACT inhaler USE 2 PUFFS INHALED EVERY 12 HOURS TO PREVENT COUGH OR WHEEZE-RINSE, GARGLE, SPIT AFTER USE. 10.2 g 4  . Calcium Citrate-Vitamin D (CALCIUM + D PO) Take 1,200 mg by mouth daily.    . cetirizine (ZYRTEC) 5 MG tablet Take 5 mg by mouth daily.    Marland Kitchen estradiol (ESTRACE) 1 MG tablet Take 1 mg by mouth daily. Pt takes 0.5 mg    . famotidine (PEPCID) 20 MG tablet Take 1 tablet (20 mg total) by mouth 2 (two) times daily. (Patient taking differently: Take 20 mg by mouth as needed. ) 30 tablet 5  . Fexofenadine HCl (MUCINEX ALLERGY PO) Take by mouth.    Marland Kitchen omeprazole (PRILOSEC) 20 MG capsule Take 1 capsule (20 mg total) by mouth daily. 30 capsule 3  . umeclidinium bromide (INCRUSE ELLIPTA) 62.5 MCG/INH AEPB Inhale 1 puff into the lungs daily. (Patient not taking: Reported on 02/01/2018) 30 each 5  . zolpidem (AMBIEN) 10 MG tablet Take 10 mg by mouth at bedtime.      No current facility-administered medications for this visit.     Allergies as of 06/15/2018 - Review Complete 06/13/2018  Allergen Reaction Noted  . Morphine and related Itching 04/18/2012    Vitals: There were no vitals taken for this visit. Last Weight:  Wt Readings  from Last 1 Encounters:  12/21/17 149 lb (67.6 kg)   Last Height:   Ht Readings from Last 1 Encounters:  12/21/17 5' 3.5" (1.613 m)    Physical exam: Exam: Gen: NAD, conversant, well nourised, well groomed  CV: RRR, no MRG. No Carotid Bruits. No peripheral edema, warm, nontender Eyes: Conjunctivae clear without exudates or hemorrhage  Neuro: Detailed Neurologic Exam  Speech:    Speech is normal; fluent and spontaneous with normal comprehension.  Cognition:    The patient is oriented to person, place, and time;     recent and remote memory intact;     language fluent;     normal attention, concentration,     fund of knowledge Cranial Nerves:    The pupils are equal, round, and reactive to light. The fundi are normal and spontaneous venous pulsations are present. Visual fields are full to finger confrontation. Extraocular movements are intact. Trigeminal sensation is intact and the muscles of mastication are normal. The face is symmetric. The palate elevates in the midline. Hearing intact. Voice is normal. Shoulder shrug is normal. The tongue has normal motion without fasciculations.   Coordination:    Normal finger to nose and heel to shin. Normal rapid alternating movements.   Gait:    Heel-toe and tandem gait are normal.   Motor Observation:  No asymmetry, no atrophy, and no involuntary movements noted. Tone:    Normal muscle tone.    Posture:    Posture is normal. normal erect    Strength:left arm proximal weakness otherwise strength is V/V in the upper and lower limbs.      Sensation: intact to LT     Reflex Exam:  DTR's:    Deep tendon reflexes in the upper and lower extremities are normal bilaterally.   Toes:    The toes are downgoing bilaterally.   Clonus:    Clonus is absent.  Neg Phalens and Neg Tinels at the wrists     Assessment/Plan:   Patient here for follow-up of paresthesias when I initially saw her in late 2016-04-06 her husband had  died of ALS and she was very worried about having it herself.  EMG nerve conduction study was normal.  MRI of the cervical spine showed no cervical cord pathology but multi-level foraminal stenoses.  I reassured patient.  She continues to have the sensations in her arm, radiating from the upper arm, she has a hx of chronic neck pain and surgeries. Likely cervical radic. She has had ESI with Dr. Nelva Bush for ESI in the past. We discussed treatment and she would like to start Gabapentin.   She will contact us for any side effects, reviewed with patient esp sedation and risk of falls, she can call if needs increase or wants referral back to Dr. Norville Haggard.  Meds ordered this encounter  Medications  . gabapentin (NEURONTIN) 100 MG capsule    Sig: Take 1 capsule (100 mg total) by mouth 3 (three) times daily.    Dispense:  90 capsule    Refill:  6    Cc: Dr Inda Merlin Sarina Ill, MD  [facility] [address] [place], [place] [address]  Phone [phone] Fax [phone]

## 2018-06-27 ENCOUNTER — Telehealth: Payer: Self-pay | Admitting: Neurology

## 2018-06-27 ENCOUNTER — Other Ambulatory Visit: Payer: Self-pay | Admitting: Neurology

## 2018-06-27 MED ORDER — GABAPENTIN 300 MG PO CAPS: 300.0000 mg | ORAL_CAPSULE | Freq: Three times a day (TID) | ORAL | 11 refills | Status: DC

## 2018-06-27 NOTE — Telephone Encounter (Signed)
Spoke with patient. She is taking Gabapentin 100 mg three times daily. It is not helping. She would like to increase. She denied any negative effects from the medication. Ok to LVM on home # with details. I advised message would be sent to Dr. Jaynee Eagles.

## 2018-06-27 NOTE — Telephone Encounter (Signed)
I called the patient and LVM (ok per pt) advising pt that Dr. Jaynee Eagles increased her Gabapentin to 300 mg three times daily. I advised the new prescription was sent to [facility] by Dr. Jaynee Eagles. This prescription is one 300 mg three times daily. I asked for a call back if she has any questions or any concerns of s/e with the dose increase.

## 2018-06-27 NOTE — Telephone Encounter (Signed)
Pt called in and stated the gabapentin (NEURONTIN) 100 MG capsule isnt helping her at all and she feels dosage may need to be increased , she states it is ok to leave a detailed messgae , and if the plan is to change dosage pharmacy may be contacted.

## 2018-06-27 NOTE — Telephone Encounter (Signed)
icreased to 300mg  tid thanks

## 2018-07-05 DIAGNOSIS — F324 Major depressive disorder, single episode, in partial remission: Secondary | ICD-10-CM | POA: Diagnosis not present

## 2018-07-05 DIAGNOSIS — R42 Dizziness and giddiness: Secondary | ICD-10-CM | POA: Diagnosis not present

## 2018-07-05 DIAGNOSIS — F4321 Adjustment disorder with depressed mood: Secondary | ICD-10-CM | POA: Diagnosis not present

## 2018-07-05 DIAGNOSIS — Z658 Other specified problems related to psychosocial circumstances: Secondary | ICD-10-CM | POA: Diagnosis not present

## 2018-07-05 DIAGNOSIS — Z9181 History of falling: Secondary | ICD-10-CM | POA: Diagnosis not present

## 2018-07-05 DIAGNOSIS — F418 Other specified anxiety disorders: Secondary | ICD-10-CM | POA: Diagnosis not present

## 2018-07-11 NOTE — Telephone Encounter (Signed)
Pt called in and stated the 300mg  dosage of Gabapentin isn't working , she is stating it doesn't make any difference at all.

## 2018-07-11 NOTE — Telephone Encounter (Signed)
We can increase the Gabapentin to 600mg  three times a day as long as she is not having any side effects to the 300 dosage. thanks

## 2018-07-12 ENCOUNTER — Ambulatory Visit (INDEPENDENT_AMBULATORY_CARE_PROVIDER_SITE_OTHER): Payer: Medicare Other | Admitting: Allergy and Immunology

## 2018-07-12 ENCOUNTER — Encounter: Payer: Self-pay | Admitting: Allergy and Immunology

## 2018-07-12 ENCOUNTER — Other Ambulatory Visit: Payer: Self-pay

## 2018-07-12 VITALS — BP 112/72 | HR 80 | Temp 98.7°F | Resp 18 | Ht 63.5 in

## 2018-07-12 DIAGNOSIS — J4540 Moderate persistent asthma, uncomplicated: Secondary | ICD-10-CM | POA: Diagnosis not present

## 2018-07-12 DIAGNOSIS — K219 Gastro-esophageal reflux disease without esophagitis: Secondary | ICD-10-CM | POA: Diagnosis not present

## 2018-07-12 DIAGNOSIS — H9313 Tinnitus, bilateral: Secondary | ICD-10-CM

## 2018-07-12 DIAGNOSIS — J3089 Other allergic rhinitis: Secondary | ICD-10-CM

## 2018-07-12 NOTE — Telephone Encounter (Signed)
Tat is fine, thanks

## 2018-07-12 NOTE — Patient Instructions (Addendum)
  1. Continue Symbicort 160 mcg  2 puffs 2 times a day   2. Start Spiriva 1.25 - 2 inhalations one time per day  3. Can add Asmanex 100 2 inhalations 2 times per day if asthma flare up      4. Continue omeprazole 20 mg in morning + famotidine 20 mg in evening   5. Continue OTC Zyrtec 1 time per day if needed  6. Continue ProAir HFA 2 puffs every 4-6 hours if needed  7. Return to clinic in 6 months or earlier if problem  8. Obtain fall flu vaccine

## 2018-07-12 NOTE — Progress Notes (Signed)
[place]   Follow-up Note  Referring Provider: Josetta Huddle, MD Primary Provider: Josetta Huddle, MD Date of Office Visit: 07/12/2018  Subjective:   Susan Li (DOB: 1940-05-24) is a 78 y.o. female who returns to the [place] on 07/12/2018 in re-evaluation of the following:  HPI: [name] returns to this clinic in reevaluation of asthma and allergic rhinitis and reflux and tinnitus.  I last saw her in this clinic on 01 February 2018.  Overall her asthma has been doing relatively well but she still must use a bronchodilator usually while she is laying down in bed getting ready to go to sleep because she does have some chest tightness and she does have a response to the bronchodilator.  Her use of a bronchodilator averages out about 3-4 times per week.  It should be noted that she continues to use Symbicort but is not using Incruse as her insurance will not cover this medication.  When she used a combination of Symbicort and Incruse she really did very well and did not need to use a short acting bronchodilator.  She has not required a systemic steroid or an antibiotic for any type of airway issue.  She has really no issues with her nose at this point.  Reflux has been under pretty good control at this point while using a combination of omeprazole and famotidine.  She still suffers from tinnitus which has been thoroughly evaluated by ENT in the past.  Allergies as of 07/12/2018      Reactions   Morphine And Related Itching   Itching nose      Medication List      albuterol 108 (90 Base) MCG/ACT inhaler Commonly known as: Ventolin HFA USE 2 PUFFS EVERY 4 TO 6 HOURS AS NEEDED FOR COUGH/WHEEZING.   ALPRAZolam 0.5 MG tablet Commonly known as: XANAX Take 0.12 mg by mouth at bedtime.   azelastine 0.1 % nasal spray Commonly known as: ASTELIN Place 2 sprays into both nostrils 2 (two) times daily.   BIOTIN PO Take 5,000  mg by mouth daily.   budesonide-formoterol 160-4.5 MCG/ACT inhaler Commonly known as: SYMBICORT USE 2 PUFFS INHALED EVERY 12 HOURS TO PREVENT COUGH OR WHEEZE-RINSE, GARGLE, SPIT AFTER USE.   CALCIUM + D PO Take 1,200 mg by mouth daily.   cetirizine 5 MG tablet Commonly known as: ZYRTEC Take 5 mg by mouth daily.   estradiol 1 MG tablet Commonly known as: ESTRACE Take 1 mg by mouth daily. Pt takes 0.5 mg   famotidine 20 MG tablet Commonly known as: Pepcid Take 1 tablet (20 mg total) by mouth 2 (two) times daily. What changed:   when to take this  reasons to take this   gabapentin 300 MG capsule Commonly known as: NEURONTIN Take 1 capsule (300 mg total) by mouth 3 (three) times daily. What changed:   how much to take  when to take this   [place] Take by mouth.   omeprazole 20 MG capsule Commonly known as: PRILOSEC Take 1 capsule (20 mg total) by mouth daily.   umeclidinium bromide 62.5 MCG/INH Aepb Commonly known as: INCRUSE ELLIPTA Inhale 1 puff into the lungs daily.   zolpidem 10 MG tablet Commonly known as: AMBIEN Take 2.5 mg by mouth at bedtime.       Past Medical History:  Diagnosis Date  . Arthritis    hips,backs,hands; osteoarthritis  . Asthma  Symbicort as needed 3x week  . Bursitis   . Fibromyalgia   . GERD (gastroesophageal reflux disease)   . Insomnia   . Sciatica of right side   . Vitamin D deficiency     Past Surgical History:  Procedure Laterality Date  . ABDOMINAL HYSTERECTOMY  2000  . Buninonectomy  2008  . [place] SURGERY  2004  . COLONOSCOPY WITH PROPOFOL N/A 04/25/2012   Procedure: COLONOSCOPY WITH PROPOFOL;  Surgeon: Garlan Fair, MD;  Location: [facility];  Service: Endoscopy;  Laterality: N/A;  . LASIK  2004  . left shoulder surgery    . RESECTION DISTAL CLAVICAL Left 08/24/2014   Procedure: RESECTION DISTAL CLAVICAL;  Surgeon: Melrose Nakayama, MD;  Location: [facility];  Service:  Orthopedics;  Laterality: Left;  . SHOULDER ARTHROSCOPY WITH SUBACROMIAL DECOMPRESSION Left 08/24/2014   Procedure: ARTHROSCOPY  LEFT SHOULDER, SUBACROMIAL DECOMPRESSION, DISTAL CLAVICLE RESECTION AND DEBRIDEMENT;  Surgeon: Melrose Nakayama, MD;  Location: [facility];  Service: Orthopedics;  Laterality: Left;  . Tendernitis Surgery  99    Review of systems negative except as noted in HPI / PMHx or noted below:  Review of Systems  Constitutional: Negative.   HENT: Negative.   Eyes: Negative.   Respiratory: Negative.   Cardiovascular: Negative.   Gastrointestinal: Negative.   Genitourinary: Negative.   Musculoskeletal: Negative.   Skin: Negative.   Neurological: Negative.   Endo/Heme/Allergies: Negative.   Psychiatric/Behavioral: Negative.      Objective:   Vitals:   07/12/18 1518  BP: 112/72  Pulse: 80  Resp: 18  Temp: 98.7 F (37.1 C)  SpO2: 98%   Height: 5' 3.5" (161.3 cm)      Physical Exam Constitutional:      Appearance: She is not diaphoretic.  HENT:     Head: Normocephalic.     Right Ear: Tympanic membrane, ear canal and external ear normal.     Left Ear: Tympanic membrane, ear canal and external ear normal.     Nose: Nose normal. No mucosal edema or rhinorrhea.     Mouth/Throat:     Pharynx: Uvula midline. No oropharyngeal exudate.  Eyes:     Conjunctiva/sclera: Conjunctivae normal.  Neck:     Thyroid: No thyromegaly.     Trachea: Trachea normal. No tracheal tenderness or tracheal deviation.  Cardiovascular:     Rate and Rhythm: Normal rate and regular rhythm.     Heart sounds: Normal heart sounds, S1 normal and S2 normal. No murmur.  Pulmonary:     Effort: No respiratory distress.     Breath sounds: Normal breath sounds. No stridor. No wheezing or rales.  Lymphadenopathy:     Head:     Right side of head: No tonsillar adenopathy.     Left side of head: No tonsillar adenopathy.     Cervical: No cervical adenopathy.  Skin:     Findings: No erythema or rash.     Nails: There is no clubbing.   Neurological:     Mental Status: She is alert.     Diagnostics:    Spirometry was performed and demonstrated an FEV1 of 1.77 at 89 % of predicted.   Assessment and Plan:   1. Not well controlled moderate persistent asthma   2. Other allergic rhinitis   3. LPRD (laryngopharyngeal reflux disease)   4. Tinnitus of both ears     1. Continue Symbicort 160 mcg  2 puffs 2 times a day   2.  Start Spiriva 1.25 - 2 inhalations one time per day  3. Can add Asmanex 100 2 inhalations 2 times per day if asthma flare up      4. Continue omeprazole 20 mg in morning + famotidine 20 mg in evening   5. Continue OTC Zyrtec 1 time per day if needed   6. Continue ProAir HFA 2 puffs every 4-6 hours if needed  7. Return to clinic in 6 months or earlier if problem  8. Obtain fall flu vaccine  L[place] will add in an anticholinergic agent to her Symbicort in an attempt to get her a little better control of her asthma and minimize her requirement for short acting bronchodilator.  She will keep informed me about her response to this approach.  She will also continue to treat her reflux with therapy noted above.  I given her some literature on hearing aid devices specifically made for tinnitus.  She can return to this clinic in 6 months or earlier if there is a problem.  Allena Katz, MD Allergy / Immunology [place]

## 2018-07-12 NOTE — Telephone Encounter (Signed)
Noted thanks °

## 2018-07-12 NOTE — Telephone Encounter (Signed)
I spoke with the patient and discussed Dr. Cathren Laine suggestions for increasing Gabapentin if not having any side effects. Patients verbalized concerns with taking both Xanax and Gabapentin. She is planning to wean off both. She will call primary care to discuss weaning of Xanax. She decided to take Gabapentin BID x 3 days, then once daily. She will call back after she has completed the wean off and then will make a telephone appt with Dr. Jaynee Eagles to discuss next steps. She would like a call back if Dr. Jaynee Eagles has any concerns with her plans.

## 2018-07-13 ENCOUNTER — Encounter: Payer: Self-pay | Admitting: Allergy and Immunology

## 2018-07-13 ENCOUNTER — Other Ambulatory Visit: Payer: Self-pay | Admitting: *Deleted

## 2018-07-13 MED ORDER — ALBUTEROL SULFATE HFA 108 (90 BASE) MCG/ACT IN AERS: INHALATION_SPRAY | RESPIRATORY_TRACT | 1 refills | Status: DC

## 2018-07-13 MED ORDER — ASMANEX HFA 100 MCG/ACT IN AERO: 2.0000 | INHALATION_SPRAY | Freq: Two times a day (BID) | RESPIRATORY_TRACT | 5 refills | Status: DC

## 2018-07-13 MED ORDER — BUDESONIDE-FORMOTEROL FUMARATE 160-4.5 MCG/ACT IN AERO: 2.0000 | INHALATION_SPRAY | Freq: Two times a day (BID) | RESPIRATORY_TRACT | 5 refills | Status: DC

## 2018-07-13 MED ORDER — SPIRIVA RESPIMAT 1.25 MCG/ACT IN AERS: 2.0000 | INHALATION_SPRAY | Freq: Every day | RESPIRATORY_TRACT | 5 refills | Status: DC

## 2018-07-13 MED ORDER — FAMOTIDINE 20 MG PO TABS: ORAL_TABLET | ORAL | 5 refills | Status: DC

## 2018-07-13 MED ORDER — OMEPRAZOLE 20 MG PO CPDR: 20.0000 mg | DELAYED_RELEASE_CAPSULE | Freq: Every day | ORAL | 5 refills | Status: DC

## 2018-07-13 NOTE — Telephone Encounter (Signed)
[place] called patient did not get rxs sent refills sent in per Dr Lyndon Code note

## 2018-07-14 ENCOUNTER — Telehealth: Payer: Self-pay | Admitting: Allergy and Immunology

## 2018-07-14 NOTE — Telephone Encounter (Signed)
Called and left a voicemail asking to return call. Unable to do PA due to needing updated prescription insurance information. Will need this information when the patient call back.

## 2018-07-14 NOTE — Telephone Encounter (Signed)
Patient called back and received prescription information. Currently working on [place]. RX: [facility]; RX BIN: [id number]

## 2018-07-14 NOTE — Telephone Encounter (Signed)
Patient was seen by Dr. Neldon Mc on 07-12-18 and was prescribed Spiriva. Her insurance will not cover this without a prior authorization. She said the name of the insurance is [employer] and their prior authorization department # is [phone]. She stated this medication is very expensive, $600, and if she has to pay this each time, she would like an alternative to this.

## 2018-07-18 NOTE — Telephone Encounter (Signed)
PA has been approved for Spiriva. Called patient and left a detailed voicemail per [facility] permission. Approval form has been faxed to pharmacy and placed in bulk scanning.

## 2018-07-18 NOTE — Telephone Encounter (Signed)
PA has been submitted via CoverMyMeds and is currently pending. If PA is denied Atrovent is the next preferred inhaler to try before Spiriva will be approved.

## 2018-07-21 ENCOUNTER — Telehealth: Payer: Self-pay | Admitting: *Deleted

## 2018-07-21 NOTE — Telephone Encounter (Signed)
PA required for Asmanex. PA completed via CoverMyMeds. PA pending.

## 2018-07-21 NOTE — Telephone Encounter (Signed)
PA approved. Approval faxed to pharmacy

## 2018-09-10 ENCOUNTER — Encounter (HOSPITAL_COMMUNITY): Payer: Self-pay

## 2018-09-10 ENCOUNTER — Encounter (HOSPITAL_COMMUNITY): Payer: Self-pay | Admitting: Emergency Medicine

## 2018-09-10 ENCOUNTER — Emergency Department (HOSPITAL_COMMUNITY)
Admission: EM | Admit: 2018-09-10 | Discharge: 2018-09-11 | Disposition: A | Discharge status: Home / Self Care | Payer: Medicare Other | Attending: Emergency Medicine | Admitting: Emergency Medicine

## 2018-09-10 ENCOUNTER — Ambulatory Visit (INDEPENDENT_AMBULATORY_CARE_PROVIDER_SITE_OTHER)
Admission: EM | Admit: 2018-09-10 | Discharge: 2018-09-10 | Disposition: A | Discharge status: Home / Self Care | Payer: Medicare Other | Source: Home / Self Care

## 2018-09-10 ENCOUNTER — Other Ambulatory Visit: Payer: Self-pay

## 2018-09-10 ENCOUNTER — Emergency Department (HOSPITAL_COMMUNITY): Payer: Medicare Other

## 2018-09-10 DIAGNOSIS — J45909 Unspecified asthma, uncomplicated: Secondary | ICD-10-CM | POA: Insufficient documentation

## 2018-09-10 DIAGNOSIS — R110 Nausea: Secondary | ICD-10-CM

## 2018-09-10 DIAGNOSIS — R457 State of emotional shock and stress, unspecified: Secondary | ICD-10-CM | POA: Diagnosis not present

## 2018-09-10 DIAGNOSIS — R011 Cardiac murmur, unspecified: Secondary | ICD-10-CM | POA: Insufficient documentation

## 2018-09-10 DIAGNOSIS — M797 Fibromyalgia: Secondary | ICD-10-CM | POA: Insufficient documentation

## 2018-09-10 DIAGNOSIS — M1990 Unspecified osteoarthritis, unspecified site: Secondary | ICD-10-CM | POA: Insufficient documentation

## 2018-09-10 DIAGNOSIS — R42 Dizziness and giddiness: Secondary | ICD-10-CM

## 2018-09-10 DIAGNOSIS — Z79899 Other long term (current) drug therapy: Secondary | ICD-10-CM | POA: Insufficient documentation

## 2018-09-10 DIAGNOSIS — R112 Nausea with vomiting, unspecified: Secondary | ICD-10-CM | POA: Insufficient documentation

## 2018-09-10 DIAGNOSIS — K219 Gastro-esophageal reflux disease without esophagitis: Secondary | ICD-10-CM | POA: Insufficient documentation

## 2018-09-10 DIAGNOSIS — R030 Elevated blood-pressure reading, without diagnosis of hypertension: Secondary | ICD-10-CM

## 2018-09-10 DIAGNOSIS — Z9181 History of falling: Secondary | ICD-10-CM | POA: Diagnosis not present

## 2018-09-10 DIAGNOSIS — Z20828 Contact with and (suspected) exposure to other viral communicable diseases: Secondary | ICD-10-CM | POA: Insufficient documentation

## 2018-09-10 DIAGNOSIS — R51 Headache: Secondary | ICD-10-CM | POA: Diagnosis not present

## 2018-09-10 DIAGNOSIS — I10 Essential (primary) hypertension: Secondary | ICD-10-CM | POA: Diagnosis not present

## 2018-09-10 LAB — POCT URINALYSIS DIP (DEVICE)
Bilirubin Urine: NEGATIVE
Glucose, UA: NEGATIVE mg/dL
Ketones, ur: 15 mg/dL — AB
Leukocytes,Ua: NEGATIVE
Nitrite: NEGATIVE
Protein, ur: NEGATIVE mg/dL
Specific Gravity, Urine: 1.01 (ref 1.005–1.030)
Urobilinogen, UA: 0.2 mg/dL (ref 0.0–1.0)
pH: 5.5 (ref 5.0–8.0)

## 2018-09-10 LAB — URINALYSIS, ROUTINE W REFLEX MICROSCOPIC
Bilirubin Urine: NEGATIVE
Glucose, UA: NEGATIVE mg/dL
Hgb urine dipstick: NEGATIVE
Ketones, ur: 5 mg/dL — AB
Leukocytes,Ua: NEGATIVE
Nitrite: NEGATIVE
Protein, ur: NEGATIVE mg/dL
Specific Gravity, Urine: 1.001 — ABNORMAL LOW (ref 1.005–1.030)
pH: 6 (ref 5.0–8.0)

## 2018-09-10 LAB — COMPREHENSIVE METABOLIC PANEL
ALT: 20 U/L (ref 0–44)
AST: 28 U/L (ref 15–41)
Albumin: 3.9 g/dL (ref 3.5–5.0)
Alkaline Phosphatase: 53 U/L (ref 38–126)
Anion gap: 14 (ref 5–15)
BUN: 8 mg/dL (ref 8–23)
CO2: 18 mmol/L — ABNORMAL LOW (ref 22–32)
Calcium: 9.1 mg/dL (ref 8.9–10.3)
Chloride: 101 mmol/L (ref 98–111)
Creatinine, Ser: 0.62 mg/dL (ref 0.44–1.00)
GFR calc Af Amer: >60 mL/min (ref >60)
GFR calc non Af Amer: >60 mL/min (ref >60)
Glucose, Bld: 107 mg/dL — ABNORMAL HIGH (ref 70–99)
Potassium: 3.7 mmol/L (ref 3.5–5.1)
Sodium: 133 mmol/L — ABNORMAL LOW (ref 135–145)
Total Bilirubin: 0.7 mg/dL (ref 0.3–1.2)
Total Protein: 6.6 g/dL (ref 6.5–8.1)

## 2018-09-10 LAB — CBC WITH DIFFERENTIAL/PLATELET
Abs Immature Granulocytes: 0.03 10*3/uL (ref 0.00–0.07)
Basophils Absolute: 0 10*3/uL (ref 0.0–0.1)
Basophils Relative: 1 %
Eosinophils Absolute: 0.1 10*3/uL (ref 0.0–0.5)
Eosinophils Relative: 2 %
HCT: 38 % (ref 36.0–46.0)
Hemoglobin: 12.8 g/dL (ref 12.0–15.0)
Immature Granulocytes: 0 %
Lymphocytes Relative: 23 %
Lymphs Abs: 1.5 10*3/uL (ref 0.7–4.0)
MCH: 33.2 pg (ref 26.0–34.0)
MCHC: 33.7 g/dL (ref 30.0–36.0)
MCV: 98.7 fL (ref 80.0–100.0)
Monocytes Absolute: 0.7 10*3/uL (ref 0.1–1.0)
Monocytes Relative: 10 %
Neutro Abs: 4.4 10*3/uL (ref 1.7–7.7)
Neutrophils Relative %: 64 %
Platelets: 215 10*3/uL (ref 150–400)
RBC: 3.85 MIL/uL — ABNORMAL LOW (ref 3.87–5.11)
RDW: 13.1 % (ref 11.5–15.5)
WBC: 6.7 10*3/uL (ref 4.0–10.5)
nRBC: 0 % (ref 0.0–0.2)

## 2018-09-10 LAB — CBC
HCT: 41.5 % (ref 36.0–46.0)
Hemoglobin: 14.1 g/dL (ref 12.0–15.0)
MCH: 33.3 pg (ref 26.0–34.0)
MCHC: 34 g/dL (ref 30.0–36.0)
MCV: 97.9 fL (ref 80.0–100.0)
Platelets: 265 10*3/uL (ref 150–400)
RBC: 4.24 MIL/uL (ref 3.87–5.11)
RDW: 13 % (ref 11.5–15.5)
WBC: 6.9 10*3/uL (ref 4.0–10.5)
nRBC: 0 % (ref 0.0–0.2)

## 2018-09-10 MED ORDER — SODIUM CHLORIDE 0.9 % IV BOLUS
1000.0000 mL | Freq: Once | INTRAVENOUS | Status: AC
  Administered 2018-09-10: 1000 mL via INTRAVENOUS

## 2018-09-10 MED ORDER — ONDANSETRON 4 MG PO TBDP
4.0000 mg | ORAL_TABLET | Freq: Once | ORAL | Status: AC
  Administered 2018-09-10: 4 mg via ORAL

## 2018-09-10 MED ORDER — ONDANSETRON 8 MG PO TBDP: 8.0000 mg | ORAL_TABLET | Freq: Three times a day (TID) | ORAL | 0 refills | Status: DC | PRN

## 2018-09-10 MED ORDER — ONDANSETRON 4 MG PO TBDP
ORAL_TABLET | ORAL | Status: AC
  Filled 2018-09-10: qty 1

## 2018-09-10 MED ORDER — LORAZEPAM 0.5 MG PO TABS
0.5000 mg | ORAL_TABLET | Freq: Once | ORAL | Status: AC
  Administered 2018-09-10: 21:00:00 0.5 mg via ORAL
  Filled 2018-09-10: qty 1

## 2018-09-10 NOTE — Discharge Instructions (Addendum)
Please make sure you follow-up with your PCP and discuss possibility of referral to a cardiologist or neurologist given your new dizziness.  In the meantime, if you develop chest pain, severe headache, confusion or abdominal pain it may be appropriate for you to go to the emergency room to get more work done to rule out heart event or a brain event.

## 2018-09-10 NOTE — ED Notes (Signed)
Pt ambulated to BR but was not asked to provide urine specimen. Pt made aware specimen is needed by Probation officer and pt knows to call staff when able to void.

## 2018-09-10 NOTE — ED Provider Notes (Signed)
MRN: [id number] DOB: 06-22-1940  Subjective:   Susan Li is a 78 y.o. female presenting for several day history of nausea without vomiting, 6-week history of persistent intermittent mild dizziness.  Patient states that she suffered a severe fall and injured her eye which swelled shut.  The fall happened accidentally as she was walking her dog when she tripped.  She is recovered since then apart from the dizziness.  Denies any history of heart conditions, stroke.  She does use 0.25 mg of Xanax daily and 2.5 mg of Ambien as well.  She has been on these medications for years.  Has not tried any medications for relief.  She does have a history of reflux and is on omeprazole for this.  Has a history of asthma as well that is well controlled and takes her Symbicort consistently.  She has not checked with her PCP about her symptoms.  Denies smoking cigarettes.   Current Facility-Administered Medications:  .  ondansetron (ZOFRAN-ODT) disintegrating tablet 4 mg, 4 mg, Oral, Once, Vanessa Kick, MD  Current Outpatient Medications:  .  albuterol (VENTOLIN HFA) 108 (90 Base) MCG/ACT inhaler, USE 2 PUFFS EVERY 4 TO 6 HOURS AS NEEDED FOR COUGH/WHEEZING., Disp: 18 g, Rfl: 1 .  ALPRAZolam (XANAX) 0.5 MG tablet, Take 0.12 mg by mouth at bedtime. , Disp: , Rfl:  .  azelastine (ASTELIN) 0.1 % nasal spray, Place 2 sprays into both nostrils 2 (two) times daily. (Patient not taking: Reported on 07/12/2018), Disp: 30 mL, Rfl: 5 .  BIOTIN PO, Take 5,000 mg by mouth daily. , Disp: , Rfl:  .  budesonide-formoterol (SYMBICORT) 160-4.5 MCG/ACT inhaler, USE 2 PUFFS INHALED EVERY 12 HOURS TO PREVENT COUGH OR WHEEZE-RINSE, GARGLE, SPIT AFTER USE., Disp: 10.2 g, Rfl: 4 .  budesonide-formoterol (SYMBICORT) 160-4.5 MCG/ACT inhaler, Inhale 2 puffs into the lungs 2 (two) times daily., Disp: 1 Inhaler, Rfl: 5 .  Calcium Citrate-Vitamin D (CALCIUM + D PO), Take 1,200 mg by mouth daily., Disp: , Rfl:  .  cetirizine (ZYRTEC) 5 MG  tablet, Take 5 mg by mouth daily., Disp: , Rfl:  .  estradiol (ESTRACE) 1 MG tablet, Take 1 mg by mouth daily. Pt takes 0.5 mg, Disp: , Rfl:  .  famotidine (PEPCID) 20 MG tablet, Take one tablet by mouth daily at bedtime, Disp: 30 tablet, Rfl: 5 .  Fexofenadine HCl (MUCINEX ALLERGY PO), Take by mouth., Disp: , Rfl:  .  gabapentin (NEURONTIN) 300 MG capsule, Take 1 capsule (300 mg total) by mouth 3 (three) times daily. (Patient taking differently: Take 100 mg by mouth 2 (two) times a day. ), Disp: 90 capsule, Rfl: 11 .  Mometasone Furoate (ASMANEX HFA) 100 MCG/ACT AERO, Inhale 2 puffs into the lungs 2 (two) times daily., Disp: 13 g, Rfl: 5 .  omeprazole (PRILOSEC) 20 MG capsule, Take 1 capsule (20 mg total) by mouth daily., Disp: 30 capsule, Rfl: 5 .  Tiotropium Bromide Monohydrate (SPIRIVA RESPIMAT) 1.25 MCG/ACT AERS, Inhale 2 puffs into the lungs daily., Disp: 4 g, Rfl: 5 .  umeclidinium bromide (INCRUSE ELLIPTA) 62.5 MCG/INH AEPB, Inhale 1 puff into the lungs daily. (Patient not taking: Reported on 02/01/2018), Disp: 30 each, Rfl: 5 .  zolpidem (AMBIEN) 10 MG tablet, Take 2.5 mg by mouth at bedtime. , Disp: , Rfl:    Allergies  Allergen Reactions  . Morphine And Related Itching    Itching nose    Past Medical History:  Diagnosis Date  . Arthritis  hips,backs,hands; osteoarthritis  . Asthma    Symbicort as needed 3x week  . Bursitis   . Fibromyalgia   . GERD (gastroesophageal reflux disease)   . Insomnia   . Sciatica of right side   . Vitamin D deficiency      Past Surgical History:  Procedure Laterality Date  . ABDOMINAL HYSTERECTOMY  2000  . Buninonectomy  2008  . [place] SURGERY  2004  . COLONOSCOPY WITH PROPOFOL N/A 04/25/2012   Procedure: COLONOSCOPY WITH PROPOFOL;  Surgeon: Garlan Fair, MD;  Location: [facility];  Service: Endoscopy;  Laterality: N/A;  . LASIK  2004  . left shoulder surgery    . RESECTION DISTAL CLAVICAL Left 08/24/2014   Procedure:  RESECTION DISTAL CLAVICAL;  Surgeon: Melrose Nakayama, MD;  Location: [facility];  Service: Orthopedics;  Laterality: Left;  . SHOULDER ARTHROSCOPY WITH SUBACROMIAL DECOMPRESSION Left 08/24/2014   Procedure: ARTHROSCOPY  LEFT SHOULDER, SUBACROMIAL DECOMPRESSION, DISTAL CLAVICLE RESECTION AND DEBRIDEMENT;  Surgeon: Melrose Nakayama, MD;  Location: [facility];  Service: Orthopedics;  Laterality: Left;  . Tendernitis Surgery  99    Family History  Problem Relation Age of Onset  . Healthy Mother   . Healthy Father   . Neuromuscular disorder Neg Hx      Review of Systems  Constitutional: Negative for fever and malaise/fatigue.  HENT: Negative for congestion, ear pain, sinus pain and sore throat.   Eyes: Negative for blurred vision, double vision, discharge and redness.  Respiratory: Negative for cough, hemoptysis, shortness of breath and wheezing.   Cardiovascular: Negative for chest pain.  Gastrointestinal: Positive for nausea. Negative for abdominal pain, diarrhea and vomiting.  Genitourinary: Negative for dysuria, flank pain and hematuria.  Musculoskeletal: Negative for myalgias.  Skin: Negative for rash.  Neurological: Positive for dizziness. Negative for weakness and headaches.  Psychiatric/Behavioral: Negative for depression and substance abuse.    Objective:   Vitals: BP (!) 154/114   Pulse 96   Temp 97.6 F (36.4 C) (Oral)   Resp 17   SpO2 100%   Physical Exam Constitutional:      General: She is not in acute distress.    Appearance: Normal appearance. She is well-developed and normal weight. She is not ill-appearing, toxic-appearing or diaphoretic.  HENT:     Head: Normocephalic and atraumatic.     Right Ear: External ear normal.     Left Ear: External ear normal.     Nose: Nose normal.     Mouth/Throat:     Mouth: Mucous membranes are moist.     Pharynx: Oropharynx is clear.  Eyes:     General: No scleral icterus.    Extraocular  Movements: Extraocular movements intact.     Pupils: Pupils are equal, round, and reactive to light.  Cardiovascular:     Rate and Rhythm: Normal rate and regular rhythm.     Pulses: Normal pulses.     Heart sounds: Murmur (Soft systolic ejection murmur best heard at RUSB) present. No friction rub. No gallop.   Pulmonary:     Effort: Pulmonary effort is normal. No respiratory distress.     Breath sounds: Normal breath sounds. No stridor. No wheezing, rhonchi or rales.  Abdominal:     General: Bowel sounds are normal. There is no distension.     Palpations: Abdomen is soft. There is no mass.     Tenderness: There is no abdominal tenderness. There is no right CVA tenderness, left CVA tenderness, guarding  or rebound.  Skin:    General: Skin is warm and dry.     Coloration: Skin is not pale.     Findings: No rash.  Neurological:     General: No focal deficit present.     Mental Status: She is alert and oriented to person, place, and time.     Cranial Nerves: No cranial nerve deficit.     Coordination: Coordination normal.     Deep Tendon Reflexes: Reflexes normal.  Psychiatric:        Mood and Affect: Mood normal.        Behavior: Behavior normal.        Thought Content: Thought content normal.        Judgment: Judgment normal.     Results for orders placed or performed during the hospital encounter of 09/10/18 (from the past 24 hour(s))  POCT urinalysis dip (device)     Status: Abnormal   Collection Time: 09/10/18 12:06 PM  Result Value Ref Range   Glucose, UA NEGATIVE NEGATIVE mg/dL   Bilirubin Urine NEGATIVE NEGATIVE   Ketones, ur 15 (A) NEGATIVE mg/dL   Specific Gravity, Urine 1.010 1.005 - 1.030   Hgb urine dipstick TRACE (A) NEGATIVE   pH 5.5 5.0 - 8.0   Protein, ur NEGATIVE NEGATIVE mg/dL   Urobilinogen, UA 0.2 0.0 - 1.0 mg/dL   Nitrite NEGATIVE NEGATIVE   Leukocytes,Ua NEGATIVE NEGATIVE    ED ECG REPORT   Date: 09/10/2018  Rate: 98 bpm  Rhythm: normal sinus  rhythm  QRS Axis: normal  Intervals: normal  ST/T Wave abnormalities: normal  Conduction Disutrbances:none  Narrative Interpretation: Sinus rhythm at 98 bpm, nonspecific T wave flattening in lead III but largely comparable to ECG done from 2012.  Old EKG Reviewed: unchanged  I have personally reviewed the EKG tracing and agree with the computerized printout as noted.   Assessment and Plan :   1. Dizziness   2. Nausea   3. Elevated blood pressure reading in office without diagnosis of hypertension   4. Heart murmur     Patient has new onset dizziness and nausea which may be sequelae from her head injury approximately 1 month ago.  Physical exam findings are very reassuring, EKG is reassuring as well.  Patient does not have signs of urinary tract infection, CBC is pending.  Due to patient's use of benzodiazepine daily, will hold off on using meclizine.  However will help with Zofran for her nausea.  Counseled that she needs follow-up with her PCP and consider referral to neuro or cardiology.  In the meantime patient given strict ER precautions. Counseled patient on potential for adverse effects with medications prescribed/recommended today, ER and return-to-clinic precautions discussed, patient verbalized understanding.    Jaynee Eagles, PA-C 09/10/18 1259

## 2018-09-10 NOTE — ED Triage Notes (Signed)
EMS reports that pt went to PCP for Covid test this morning due to nausea, vomiting, and dizziness, starting last night at 2000. Sent home with orders for zofran, took zofran at 1100 this morning. Negative stroke exam, EKG unremarkable.  BP: 164/120 Temp: 98.8 HR: 86 Resp: 16 CGB: 115

## 2018-09-10 NOTE — ED Triage Notes (Signed)
Patient presents to Urgent Care with complaints of nausea since last night. Patient reports she has not thrown up, but the nausea will not go away. Pt endorses slight dizziness and occasional chills.  Pt was able to tolerate eating a piece of toast this morning.

## 2018-09-10 NOTE — ED Provider Notes (Signed)
  Face-to-face evaluation   History: She complains of nausea since last night, associated with a sensation of dizziness.  She had a decreased appetite today but was able to eat and drink some.  She has a mild bifrontal headache at this time.  She was seen at an urgent care earlier today where she had testing done for COVID, and was treated with Zofran without change in her nausea.  Physical exam:, Calm, cooperative.  No dysarthria or aphasia.  No nystagmus.  Normal coordination finger-to-nose.  Left external auditory canal has a foreign body in it, from which she describes likely came from a hearing aid that she tried 2 weeks ago.  Left TM and auditory canal is normal.  Medical screening examination/treatment/procedure(s) were conducted as a shared visit with non-physician practitioner(s) and myself.  I personally evaluated the patient during the encounter   Daleen Bo, MD 09/11/18 1016

## 2018-09-10 NOTE — ED Notes (Signed)
Pt ambulatory to BR with steady gait.

## 2018-09-10 NOTE — ED Notes (Signed)
Pt returned from CT °

## 2018-09-10 NOTE — ED Provider Notes (Signed)
[facility] DEPT Provider Note   CSN: [id number] Arrival date & time: 09/10/18  1753     History   Chief Complaint Chief Complaint  Patient presents with  . Dizziness  . Nausea  . Emesis    HPI Susan Li is a 78 y.o. female.     78 y.o female with a PMH of Asthma, GERD presents to the ED with a chief complaint of nausea, dizziness x last night. Patient reports a sudden onset of nausea which began last night along with dizziness which she reports is harder for her to explain. States "its hard to describe". She reports taking an Ambien last night which helped her sleep. She states upon waking up this morning her dizziness worsen, reports anorexia along with worsening nausea but no vomiting. She was seen at [facility] this morning and given zofran for her symptoms along with advised to hydrate. She states upon arriving home she began to feel more nauseated states she has not been able to eat anything due to the nausea.  Reports upon laying down she felt like her dizziness was getting worse.  She also endorses a slight headache, mostly located on the frontal aspect of her head.  No photophobia. She denies any history of vertigo, trauma or abdominal pain.   The history is provided by the patient.  Dizziness Associated symptoms: headaches and nausea   Associated symptoms: no chest pain, no shortness of breath and no vomiting   Emesis Associated symptoms: headaches   Associated symptoms: no fever     Past Medical History:  Diagnosis Date  . Arthritis    hips,backs,hands; osteoarthritis  . Asthma    Symbicort as needed 3x week  . Bursitis   . Fibromyalgia   . GERD (gastroesophageal reflux disease)   . Insomnia   . Sciatica of right side   . Vitamin D deficiency     Patient Active Problem List   Diagnosis Date Noted  . Cervical radiculopathy 06/15/2018  . Moderate persistent asthma with acute exacerbation 01/21/2017  . Cough 01/21/2017  . Other  allergic rhinitis 12/30/2016  . LPRD (laryngopharyngeal reflux disease) 12/30/2016  . Acute sinusitis 12/30/2016  . Asthma, well controlled 11/22/2014  . Allergic rhinitis due to pollen 11/22/2014    Past Surgical History:  Procedure Laterality Date  . ABDOMINAL HYSTERECTOMY  2000  . Buninonectomy  2008  . [place] SURGERY  2004  . COLONOSCOPY WITH PROPOFOL N/A 04/25/2012   Procedure: COLONOSCOPY WITH PROPOFOL;  Surgeon: Garlan Fair, MD;  Location: [facility];  Service: Endoscopy;  Laterality: N/A;  . LASIK  2004  . left shoulder surgery    . RESECTION DISTAL CLAVICAL Left 08/24/2014   Procedure: RESECTION DISTAL CLAVICAL;  Surgeon: Melrose Nakayama, MD;  Location: [facility];  Service: Orthopedics;  Laterality: Left;  . SHOULDER ARTHROSCOPY WITH SUBACROMIAL DECOMPRESSION Left 08/24/2014   Procedure: ARTHROSCOPY  LEFT SHOULDER, SUBACROMIAL DECOMPRESSION, DISTAL CLAVICLE RESECTION AND DEBRIDEMENT;  Surgeon: Melrose Nakayama, MD;  Location: [facility];  Service: Orthopedics;  Laterality: Left;  . Tendernitis Surgery  99     OB History   No obstetric history on file.      Home Medications    Prior to Admission medications   Medication Sig Start Date End Date Taking? Authorizing Provider  albuterol (VENTOLIN HFA) 108 (90 Base) MCG/ACT inhaler USE 2 PUFFS EVERY 4 TO 6 HOURS AS NEEDED FOR COUGH/WHEEZING. Patient taking differently: Inhale 2 puffs into  the lungs every 4 (four) hours as needed for wheezing or shortness of breath.  07/13/18  Yes Kozlow, Donnamarie Poag, MD  ALPRAZolam Duanne Moron) 0.5 MG tablet Take 0.25 mg by mouth at bedtime.  05/25/14  Yes [provider]  BIOTIN PO Take 5,000 mg by mouth daily.    Yes [provider]  budesonide-formoterol (SYMBICORT) 160-4.5 MCG/ACT inhaler Inhale 2 puffs into the lungs 2 (two) times daily. 07/13/18  Yes Kozlow, Donnamarie Poag, MD  Calcium Citrate-Vitamin D (CALCIUM + D PO) Take 1,200 mg by mouth daily.    Yes [provider]  cetirizine (ZYRTEC) 5 MG tablet Take 5 mg by mouth daily.   Yes [provider]  estradiol (ESTRACE) 1 MG tablet Take 0.5 mg by mouth daily.    Yes [provider]  famotidine (PEPCID) 20 MG tablet Take one tablet by mouth daily at bedtime 07/13/18  Yes Kozlow, Donnamarie Poag, MD  Mometasone Furoate [facility] HFA) 100 MCG/ACT AERO Inhale 2 puffs into the lungs 2 (two) times daily. 07/13/18  Yes Kozlow, Donnamarie Poag, MD  Multiple Vitamin (MULTIVITAMIN) tablet Take 1 tablet by mouth daily.   Yes [provider]  omeprazole (PRILOSEC) 20 MG capsule Take 1 capsule (20 mg total) by mouth daily. 07/13/18  Yes Kozlow, Donnamarie Poag, MD  ondansetron (ZOFRAN-ODT) 8 MG disintegrating tablet Take 1 tablet (8 mg total) by mouth every 8 (eight) hours as needed for nausea or vomiting. 09/10/18  Yes Jaynee Eagles, PA-C  zolpidem (AMBIEN) 10 MG tablet Take 5 mg by mouth at bedtime.    Yes [provider]  azelastine (ASTELIN) 0.1 % nasal spray Place 2 sprays into both nostrils 2 (two) times daily. Patient not taking: Reported on 07/12/2018 12/06/17   Bobbitt, Sedalia Muta, MD  budesonide-formoterol (SYMBICORT) 160-4.5 MCG/ACT inhaler USE 2 PUFFS INHALED EVERY 12 HOURS TO PREVENT COUGH OR WHEEZE-RINSE, GARGLE, SPIT AFTER USE. Patient not taking: USE 2 PUFFS INHALED EVERY 12 HOURS TO PREVENT COUGH OR WHEEZE-RINSE, GARGLE, SPIT AFTER USE. 01/07/18   Kozlow, Donnamarie Poag, MD  gabapentin (NEURONTIN) 300 MG capsule Take 1 capsule (300 mg total) by mouth 3 (three) times daily. Patient not taking: Reported on 09/10/2018 06/27/18   Melvenia Beam, MD  Tiotropium Bromide Monohydrate (SPIRIVA RESPIMAT) 1.25 MCG/ACT AERS Inhale 2 puffs into the lungs daily. Patient not taking: Reported on 09/10/2018 07/13/18   Jiles Prows, MD  umeclidinium bromide (INCRUSE ELLIPTA) 62.5 MCG/INH AEPB Inhale 1 puff into the lungs daily. Patient not taking: Reported on 02/01/2018 12/06/17   Bobbitt, Sedalia Muta, MD     Family History Family History  Problem Relation Age of Onset  . Healthy Mother   . Healthy Father   . Neuromuscular disorder Neg Hx     Social History Social History   Tobacco Use  . Smoking status: Never Smoker  . Smokeless tobacco: Never Used  Substance Use Topics  . Alcohol use: Yes    Alcohol/week: 14.0 standard drinks    Types: 14 Glasses of wine per week  . Drug use: No     Allergies   Morphine and related   Review of Systems Review of Systems  Constitutional: Negative for fever.  HENT: Negative for sinus pressure.   Respiratory: Negative for shortness of breath.   Cardiovascular: Negative for chest pain.  Gastrointestinal: Positive for nausea. Negative for constipation and vomiting.  Genitourinary: Negative for dysuria and frequency.  Musculoskeletal: Negative for back pain.  Skin: Negative for pallor and wound.  Neurological: Positive for dizziness and headaches.     Physical Exam Updated Vital Signs BP (!) 171/97 (BP Location: Right Arm)   Pulse 88   Temp 98.7 F (37.1 C) (Oral)   Resp 15   Ht 5\' 4"  (1.626 m)   Wt 61.2 kg   SpO2 100%   BMI 23.17 kg/m   Physical Exam Vitals signs and nursing note reviewed.  Constitutional:      General: She is not in acute distress.    Appearance: She is well-developed.     Comments: Appears uncomfortable.  HENT:     Head: Normocephalic and atraumatic.     Mouth/Throat:     Pharynx: No oropharyngeal exudate.  Eyes:     Pupils: Pupils are equal, round, and reactive to light.     Comments: No nystagmus on my exam.  Pupils are equal and reactive.  Neck:     Musculoskeletal: Normal range of motion.  Cardiovascular:     Rate and Rhythm: Normal rate and regular rhythm.     Heart sounds: Normal heart sounds.     Comments: No BL pitting edema.  Pulmonary:     Effort: Pulmonary effort is normal. No respiratory distress.     Breath sounds: Normal breath sounds.     Comments: Lungs are clear to auscultation  without any wheezing, rhonchi, rales. Abdominal:     General: Bowel sounds are normal. There is no distension.     Palpations: Abdomen is soft.     Tenderness: There is no abdominal tenderness.     Comments: Abdomen appears soft, no tenderness or guarding on my exam.  Musculoskeletal:        General: No tenderness or deformity.     Right lower leg: No edema.     Left lower leg: No edema.  Skin:    General: Skin is warm and dry.  Neurological:     Mental Status: She is alert and oriented to person, place, and time.     Comments: Alert, oriented, thought content appropriate. Speech fluent without evidence of aphasia. Able to follow 2 step commands without difficulty.  Cranial Nerves:  II:  Peripheral visual fields grossly normal, pupils, round, reactive to light III,IV, VI: ptosis not present, extra-ocular motions intact bilaterally  V,VII: smile symmetric, facial light touch sensation equal VIII: hearing grossly normal bilaterally  IX,X: midline uvula rise  XI: bilateral shoulder shrug equal and strong XII: midline tongue extension  Motor:  5/5 in upper and lower extremities bilaterally including strong and equal grip strength and dorsiflexion/plantar flexion Sensory: light touch normal in all extremities.  Cerebellar: normal finger-to-nose with bilateral upper extremities, pronator drift negative Gait: normal gait and balance       ED Treatments / Results  Labs (all labs ordered are listed, but only abnormal results are displayed) Labs Reviewed  CBC WITH DIFFERENTIAL/PLATELET - Abnormal; Notable for the following components:      Result Value   RBC 3.85 (*)    All other components within normal limits  COMPREHENSIVE METABOLIC PANEL - Abnormal; Notable for the following components:   Sodium 133 (*)    CO2 18 (*)    Glucose, Bld 107 (*)    All other components within normal limits  URINALYSIS, ROUTINE W REFLEX MICROSCOPIC - Abnormal; Notable for the following components:    Color, Urine STRAW (*)    Specific Gravity, Urine 1.001 (*)    Ketones, ur 5 (*)    All other components within  normal limits    EKG None  Radiology Ct Head Wo Contrast  Result Date: 09/10/2018 CLINICAL DATA:  78 year old female with acute headache and dizziness. EXAM: CT HEAD WITHOUT CONTRAST TECHNIQUE: Contiguous axial images were obtained from the base of the skull through the vertex without intravenous contrast. COMPARISON:  None. FINDINGS: Brain: No evidence of acute infarction, hemorrhage, hydrocephalus, extra-axial collection or mass effect. Mild chronic small-vessel white matter ischemic changes are noted. A 7 mm heavily calcified LEFT frontal meningioma is noted. Vascular: No hyperdense vessel or unexpected calcification. Skull: Normal. Negative for fracture or focal lesion. Sinuses/Orbits: No acute finding. Other: None. IMPRESSION: 1. No evidence of acute intracranial abnormality. 2. Mild chronic small-vessel white matter ischemic changes. Electronically Signed   By: Margarette Canada M.D.   On: 09/10/2018 21:30    Procedures Procedures (including critical care time)  Medications Ordered in ED Medications  LORazepam (ATIVAN) tablet 0.5 mg (0.5 mg Oral Given 09/10/18 2104)  sodium chloride 0.9 % bolus 1,000 mL (0 mLs Intravenous Stopped 09/11/18 0003)     Initial Impression / Assessment and Plan / ED Course  I have reviewed the triage vital signs and the nursing notes.  Pertinent labs & imaging results that were available during my care of the patient were reviewed by me and considered in my medical decision making (see chart for details).       Patient with no pertinent past medical history presents to ED with complaints of nausea along with dizziness, all symptoms began suddenly last night.  She was seen by urgent care this morning, instructed to return home hydrate along with some Zofran.  Reports upon returning home she felt worse, states the nausea has worsened, she is unable  to eat.  She proceeded to lay down on her bed and states the dizziness was worse.  She does not have a previous history of vertigo, dizziness along with nausea do not seem to change up on changing of positions.  CBC showed no leukocytosis, hemoglobin is within normal limits.  CMP showed slight hyponatremia, rest of electrolytes are within normal limits.  Creatinine level is unremarkable.  LFTs are within normal limits.  Urine showed no nitrates, leukocytes, white blood cell count.  Patient was given a liter of fluids, was monitor for her symptoms, was given 0.5 of Ativan to help with her symptoms with some improvement.  Will obtain CT head to further evaluate patient's condition as she does report a mild headache on arrival.She did take an Ambien last night to help her sleep without improvement.  She does endorse a slight headache along the frontal region, states there is no photophobia or vomiting.    CT head showed a 7 mm meningioma which has now been calcified, will place call for neurology for their recommendations.  12:27 AM Spoke to Dr. Rory Percy from neurology about meningioma, patients symptoms likely unlikely from her meningioma as this is calcified.   Patient was p.o. trial, was able to eat crackers while in the ED, reports nausea is somewhat improve.  She was also informed of her results of her CT scan, was given a liter of fluids, has had improvement in symptoms.  We will have her follow-up with PCP as needed.  Some suspicion for vertigo although patient cannot receive meclizine as she currently takes Ambien to help her sleep.  She did ask about taking Ambien tonight, I do not feel that this is safe for her to do SI was given Ativan while in the  ED, patient understands and agrees with this.  She will follow-up with PCP as needed.  I have discussed this patient with Dr. Eulis Foster was also seen patient and agrees with management at this time.  Patient stable for discharge.    Portions of this note  were generated with [employer]. Dictation errors may occur despite best attempts at proofreading.  Final Clinical Impressions(s) / ED Diagnoses   Final diagnoses:  Dizziness  Nausea    ED Discharge Orders    None       Janeece Fitting, PA-C 09/11/18 [id number]    Daleen Bo, MD 09/11/18 [phone]

## 2018-09-11 LAB — NOVEL CORONAVIRUS, NAA (HOSP ORDER, SEND-OUT TO REF LAB; TAT 18-24 HRS): SARS-CoV-2, NAA: NOT DETECTED

## 2018-09-11 NOTE — Discharge Instructions (Signed)
Please take your zofran as prescribed.   All your laboratory results were within normal limits. Please follow up with your primary care physician as needed.

## 2018-09-15 DIAGNOSIS — S060X9A Concussion with loss of consciousness of unspecified duration, initial encounter: Secondary | ICD-10-CM | POA: Diagnosis not present

## 2018-09-15 DIAGNOSIS — Z23 Encounter for immunization: Secondary | ICD-10-CM | POA: Diagnosis not present

## 2018-09-15 DIAGNOSIS — I10 Essential (primary) hypertension: Secondary | ICD-10-CM | POA: Diagnosis not present

## 2018-09-30 DIAGNOSIS — I10 Essential (primary) hypertension: Secondary | ICD-10-CM | POA: Diagnosis not present

## 2018-09-30 DIAGNOSIS — S060X9A Concussion with loss of consciousness of unspecified duration, initial encounter: Secondary | ICD-10-CM | POA: Diagnosis not present

## 2018-11-10 DIAGNOSIS — K146 Glossodynia: Secondary | ICD-10-CM | POA: Diagnosis not present

## 2018-11-17 NOTE — Patient Instructions (Signed)
Asthma Continue Symbicort 160-2 puffs twice a day with a spacer to prevent cough or wheeze Continue albuterol 2 puffs every 4 hours as needed for cough or wheeze  Allergic rhinitis Begin Nasacort 1-2 sprays in each nostril once a day as needed for a stuffy nose Continue azelastine 2 sprays once a day as needed for a runny nose Continue cetirizine 10 mg once a day as needed for a runny nose Consider saline nasal rinses as needed for nasal symptoms. Use this before any medicated nasal sprays for best result  Reflux Increase famotidine to 20 mg twice a day for reflux and continue omeprazole 20 mg once a day Continue dietary and lifestyle modifications for reflux as listed below  Thrush Begin nystatin suspension 5 mL 4 times a day for 10 days. Swish and spit the medication into the sink Continue to use a spacer with your inhalers Continue to rinse your mouth with water and spit this water into the sink after inhaler use  Call the clinic if this treatment plan is not working well for you  Follow up in 2 months or sooner if needed.

## 2018-11-17 NOTE — Progress Notes (Signed)
[place] [address] Dept: [phone]  FOLLOW UP NOTE  Patient ID: Susan Li, female    DOB: 08/21/1940  Age: 78 y.o. MRN: [id number] Date of Office Visit: 11/18/2018  Assessment  Chief Complaint: Wheezing (ongoing for a couple of months or so. says that it is not getting worse, just not getting any better. she is using all meds as prescribed and still wheezing. )  HPI Susan Li is a 78 year old female who presents to the clinic for an acute visit. She was last seen in the clinic on 07/12/2018 by Dr. Neldon Mc for evaluation of asthma, allergic rhinitis and reflux. At today's visit, she reports that she has been wheezing only at night for that last several months. She denies shortness of breath and occasionally has a cough producing clear phlegm. She reports that she does not have any wheezing during the day. She continues Symbicort 160-2 puffs twice a day with a spacer and use albuterol 2-4 times a week with relief of symptoms. She reports that her tongue has been sore "scalded feeling" for the last few weeks. She reports that she can sometimes see white patches on the back of her tongue. She reports that she always uses a spacer with Symbicort and rinses and spits the water in the sink. Allergic rhinitis is reported as moderately well controlled with sneezing and some post nasal drainage. She continues cetirizine and is not using azelastine nasal spray or nasal saline rinses at this time. Reflux is reported as moderately well controlled with heartburn a couple days of the week. She continues famotidine 10 mg once a day and omeprazole 20 mg once a day. Her current medications are listed in the chart.    Drug Allergies:  Allergies  Allergen Reactions  . Morphine And Related Itching    Itching nose    Physical Exam: BP 118/82 (BP Location: Left Arm, Patient Position: Sitting, Cuff Size: Normal)   Pulse (!) 106   Temp 98 F (36.7 C) (Temporal)   Resp 16   Ht  5' 2.5" (1.588 m)   Wt 151 lb (68.5 kg)   SpO2 95%   BMI 27.18 kg/m    Physical Exam Vitals signs reviewed.  Constitutional:      Appearance: Normal appearance.  HENT:     Head: Normocephalic and atraumatic.     Right Ear: Tympanic membrane normal.     Left Ear: Tympanic membrane normal.     Nose:     Comments: Bilateral nares slightly erythematous with clear nasal drainage noted. Pharynx erythematous with no exudate. Tongue erythematous with no white patches noted. Ears normal. Eyes normal. Eyes:     Conjunctiva/sclera: Conjunctivae normal.  Neck:     Musculoskeletal: Normal range of motion and neck supple.  Cardiovascular:     Rate and Rhythm: Normal rate and regular rhythm.     Heart sounds: Normal heart sounds. No murmur.  Pulmonary:     Effort: Pulmonary effort is normal.     Breath sounds: Normal breath sounds.     Comments: Lungs clear to auscultation Musculoskeletal: Normal range of motion.  Skin:    General: Skin is warm and dry.  Neurological:     Mental Status: She is alert and oriented to person, place, and time.  Psychiatric:        Mood and Affect: Mood normal.        Behavior: Behavior normal.        Thought  Content: Thought content normal.        Judgment: Judgment normal.     Diagnostics: FVC 2.23, FEV1 1.79. Predicted FVC 2.61, FEV1 1.95. Spirometry indicates normal ventilatory function.  Assessment and Plan: 1. Moderate persistent asthma without complication   2. Oral candida   3. Other allergic rhinitis   4. LPRD (laryngopharyngeal reflux disease)     Meds ordered this encounter  Medications  . nystatin (MYCOSTATIN) 100000 UNIT/ML suspension    Sig: Take 5 mLs (500,000 Units total) by mouth 4 (four) times daily.    Dispense:  473 mL    Refill:  0    Be sure to swish and spit into sink x 10 days.  . famotidine (PEPCID) 20 MG tablet    Sig: Take 1 tablet (20 mg total) by mouth 2 (two) times daily.    Dispense:  62 tablet    Refill:  5     Patient Instructions  Asthma Continue Symbicort 160-2 puffs twice a day with a spacer to prevent cough or wheeze Continue albuterol 2 puffs every 4 hours as needed for cough or wheeze  Allergic rhinitis Begin Nasacort 1-2 sprays in each nostril once a day as needed for a stuffy nose Continue azelastine 2 sprays once a day as needed for a runny nose Continue cetirizine 10 mg once a day as needed for a runny nose Consider saline nasal rinses as needed for nasal symptoms. Use this before any medicated nasal sprays for best result  Reflux Increase famotidine to 20 mg twice a day for reflux and continue omeprazole 20 mg once a day Continue dietary and lifestyle modifications for reflux as listed below  Thrush Begin nystatin suspension 5 mL 4 times a day for 10 days. Swish and spit the medication into the sink Continue to use a spacer with your inhalers Continue to rinse your mouth with water and spit this water into the sink after inhaler use  Call the clinic if this treatment plan is not working well for you  Follow up in 2 months or sooner if needed.   Return in about 2 months (around 01/18/2019), or if symptoms worsen or fail to improve.    Thank you for the opportunity to care for this patient.  Please do not hesitate to contact me with questions.  Gareth Morgan, FNP [facility]

## 2018-11-18 ENCOUNTER — Other Ambulatory Visit: Payer: Self-pay

## 2018-11-18 ENCOUNTER — Ambulatory Visit (INDEPENDENT_AMBULATORY_CARE_PROVIDER_SITE_OTHER): Payer: Medicare Other | Admitting: Family Medicine

## 2018-11-18 ENCOUNTER — Encounter: Payer: Self-pay | Admitting: Family Medicine

## 2018-11-18 VITALS — BP 118/82 | HR 106 | Temp 98.0°F | Resp 16 | Ht 62.5 in | Wt 151.0 lb

## 2018-11-18 DIAGNOSIS — B370 Candidal stomatitis: Secondary | ICD-10-CM | POA: Diagnosis not present

## 2018-11-18 DIAGNOSIS — J3089 Other allergic rhinitis: Secondary | ICD-10-CM

## 2018-11-18 DIAGNOSIS — J4540 Moderate persistent asthma, uncomplicated: Secondary | ICD-10-CM

## 2018-11-18 DIAGNOSIS — K219 Gastro-esophageal reflux disease without esophagitis: Secondary | ICD-10-CM | POA: Diagnosis not present

## 2018-11-18 MED ORDER — NYSTATIN 100000 UNIT/ML MT SUSP: 5.0000 mL | Freq: Four times a day (QID) | OROMUCOSAL | 0 refills | Status: DC

## 2018-11-18 MED ORDER — FAMOTIDINE 20 MG PO TABS: 20.0000 mg | ORAL_TABLET | Freq: Two times a day (BID) | ORAL | 5 refills | Status: DC

## 2018-12-16 DIAGNOSIS — R32 Unspecified urinary incontinence: Secondary | ICD-10-CM | POA: Diagnosis not present

## 2018-12-16 DIAGNOSIS — K219 Gastro-esophageal reflux disease without esophagitis: Secondary | ICD-10-CM | POA: Diagnosis not present

## 2018-12-16 DIAGNOSIS — Z0000 Encounter for general adult medical examination without abnormal findings: Secondary | ICD-10-CM | POA: Diagnosis not present

## 2018-12-16 DIAGNOSIS — J309 Allergic rhinitis, unspecified: Secondary | ICD-10-CM | POA: Diagnosis not present

## 2018-12-16 DIAGNOSIS — G479 Sleep disorder, unspecified: Secondary | ICD-10-CM | POA: Diagnosis not present

## 2018-12-16 DIAGNOSIS — E559 Vitamin D deficiency, unspecified: Secondary | ICD-10-CM | POA: Diagnosis not present

## 2018-12-16 DIAGNOSIS — K589 Irritable bowel syndrome without diarrhea: Secondary | ICD-10-CM | POA: Diagnosis not present

## 2018-12-16 DIAGNOSIS — Z79899 Other long term (current) drug therapy: Secondary | ICD-10-CM | POA: Diagnosis not present

## 2018-12-16 DIAGNOSIS — Z1389 Encounter for screening for other disorder: Secondary | ICD-10-CM | POA: Diagnosis not present

## 2018-12-16 DIAGNOSIS — F324 Major depressive disorder, single episode, in partial remission: Secondary | ICD-10-CM | POA: Diagnosis not present

## 2018-12-16 DIAGNOSIS — K140 Glossitis: Secondary | ICD-10-CM | POA: Diagnosis not present

## 2018-12-16 DIAGNOSIS — J4520 Mild intermittent asthma, uncomplicated: Secondary | ICD-10-CM | POA: Diagnosis not present

## 2018-12-16 DIAGNOSIS — Z658 Other specified problems related to psychosocial circumstances: Secondary | ICD-10-CM | POA: Diagnosis not present

## 2018-12-16 DIAGNOSIS — I10 Essential (primary) hypertension: Secondary | ICD-10-CM | POA: Diagnosis not present

## 2019-01-10 ENCOUNTER — Other Ambulatory Visit: Payer: Self-pay

## 2019-01-10 ENCOUNTER — Encounter: Payer: Self-pay | Admitting: Allergy and Immunology

## 2019-01-10 ENCOUNTER — Ambulatory Visit (INDEPENDENT_AMBULATORY_CARE_PROVIDER_SITE_OTHER): Payer: Medicare Other | Admitting: Allergy and Immunology

## 2019-01-10 VITALS — BP 154/80 | HR 68 | Temp 97.6°F | Resp 16

## 2019-01-10 DIAGNOSIS — I10 Essential (primary) hypertension: Secondary | ICD-10-CM

## 2019-01-10 DIAGNOSIS — K140 Glossitis: Secondary | ICD-10-CM | POA: Diagnosis not present

## 2019-01-10 DIAGNOSIS — K219 Gastro-esophageal reflux disease without esophagitis: Secondary | ICD-10-CM | POA: Diagnosis not present

## 2019-01-10 DIAGNOSIS — J3089 Other allergic rhinitis: Secondary | ICD-10-CM

## 2019-01-10 DIAGNOSIS — J4540 Moderate persistent asthma, uncomplicated: Secondary | ICD-10-CM | POA: Diagnosis not present

## 2019-01-10 NOTE — Progress Notes (Signed)
[facility]   Follow-up Note  Referring Provider: Josetta Huddle, MD Primary Provider: Josetta Huddle, MD Date of Office Visit: 01/10/2019  Subjective:   Susan Li (DOB: 12/24/1940) is a 78 y.o. female who returns to the [facility] on 01/10/2019 in re-evaluation of the following:  HPI: Sabri returns to this clinic in reevaluation of asthma and allergic rhinitis and reflux and a recent history of oral cavity symptomatology.  Her last visit to this clinic with me was 12 July 2018 but she did visit with Dr. Nelva Bush on 18 November 2018.  With her visit with Dr. Nelva Bush she was having this "scalded feeling" in her mouth.  She was treated with nystatin oral solution which did not help.  Subsequently she has been treated with biotin which has not helped.  She has been taken off losartan assuming that her losartan administration was giving rise to some form of glossitis which did not help.  She has seen a dentist and an oral surgeon and she has changed her toothpaste eliminating her peroxide toothpaste and now using [employer].  Her airway is actually doing relatively well, however, she always has a little bit of wheeze at nighttime when she lays down.  She is not really sure if her short acting bronchodilator helps this issue very much.  She does use her short acting bronchodilator about 1 time per day to see if it does eliminate this wheeze.  She continues on Symbicort mostly twice a day but occasionally just 1 time per day and she does not have any shortness of breath or chest tightness or coughing.  She has very little issues with her nose at this point in time.  Her reflux is actually doing pretty good at this point while using a combination of omeprazole on a daily basis and famotidine about 3 times per week.  She drinks 2 glasses of wine every night.  She did obtain a flu vaccine this fall.  Allergies as of 01/10/2019    Reactions   Morphine And Related Itching   Itching nose      Medication List      albuterol 108 (90 Base) MCG/ACT inhaler Commonly known as: Ventolin HFA USE 2 PUFFS EVERY 4 TO 6 HOURS AS NEEDED FOR COUGH/WHEEZING.   ALPRAZolam 0.5 MG tablet Commonly known as: XANAX Take 0.25 mg by mouth at bedtime.   BIOTIN PO Take 5,000 mg by mouth daily.   budesonide-formoterol 160-4.5 MCG/ACT inhaler Commonly known as: SYMBICORT USE 2 PUFFS INHALED EVERY 12 HOURS TO PREVENT COUGH OR WHEEZE-RINSE, GARGLE, SPIT AFTER USE.   CALCIUM + D PO Take 1,200 mg by mouth daily.   cetirizine 5 MG tablet Commonly known as: ZYRTEC Take 5 mg by mouth daily.   estradiol 1 MG tablet Commonly known as: ESTRACE Take 0.5 mg by mouth daily.   famotidine 20 MG tablet Commonly known as: Pepcid Take one tablet by mouth daily at bedtime   omeprazole 20 MG capsule Commonly known as: PRILOSEC Take 1 capsule (20 mg total) by mouth daily.   zolpidem 10 MG tablet Commonly known as: AMBIEN Take 5 mg by mouth at bedtime.       Past Medical History:  Diagnosis Date  . Arthritis    hips,backs,hands; osteoarthritis  . Asthma    Symbicort as needed 3x week  . Bursitis   . Fibromyalgia   . GERD (gastroesophageal reflux disease)   . Insomnia   .  Sciatica of right side   . Vitamin D deficiency     Past Surgical History:  Procedure Laterality Date  . ABDOMINAL HYSTERECTOMY  2000  . Buninonectomy  2008  . [place] SURGERY  2004  . COLONOSCOPY WITH PROPOFOL N/A 04/25/2012   Procedure: COLONOSCOPY WITH PROPOFOL;  Surgeon: Garlan Fair, MD;  Location: [facility];  Service: Endoscopy;  Laterality: N/A;  . LASIK  2004  . left shoulder surgery    . RESECTION DISTAL CLAVICAL Left 08/24/2014   Procedure: RESECTION DISTAL CLAVICAL;  Surgeon: Melrose Nakayama, MD;  Location: [facility];  Service: Orthopedics;  Laterality: Left;  . SHOULDER ARTHROSCOPY WITH SUBACROMIAL DECOMPRESSION  Left 08/24/2014   Procedure: ARTHROSCOPY  LEFT SHOULDER, SUBACROMIAL DECOMPRESSION, DISTAL CLAVICLE RESECTION AND DEBRIDEMENT;  Surgeon: Melrose Nakayama, MD;  Location: [facility];  Service: Orthopedics;  Laterality: Left;  . Tendernitis Surgery  99    Review of systems negative except as noted in HPI / PMHx or noted below:  Review of Systems  Constitutional: Negative.   HENT: Negative.   Eyes: Negative.   Respiratory: Negative.   Cardiovascular: Negative.   Gastrointestinal: Negative.   Genitourinary: Negative.   Musculoskeletal: Negative.   Skin: Negative.   Neurological: Negative.   Endo/Heme/Allergies: Negative.   Psychiatric/Behavioral: Negative.      Objective:   Vitals:   01/10/19 1525 01/10/19 1704  BP: (!) 160/90 (!) 154/80  Pulse: 68   Resp: 16   Temp: 97.6 F (36.4 C)   SpO2: 98%           Physical Exam Constitutional:      Appearance: She is not diaphoretic.  HENT:     Head: Normocephalic.     Right Ear: Tympanic membrane, ear canal and external ear normal.     Left Ear: Tympanic membrane, ear canal and external ear normal.     Nose: Nose normal. No mucosal edema or rhinorrhea.     Mouth/Throat:     Pharynx: Uvula midline. No oropharyngeal exudate.  Eyes:     Conjunctiva/sclera: Conjunctivae normal.  Neck:     Thyroid: No thyromegaly.     Trachea: Trachea normal. No tracheal tenderness or tracheal deviation.  Cardiovascular:     Rate and Rhythm: Normal rate and regular rhythm.     Heart sounds: Normal heart sounds, S1 normal and S2 normal. No murmur.  Pulmonary:     Effort: No respiratory distress.     Breath sounds: Normal breath sounds. No stridor. No wheezing or rales.  Lymphadenopathy:     Head:     Right side of head: No tonsillar adenopathy.     Left side of head: No tonsillar adenopathy.     Cervical: No cervical adenopathy.  Skin:    Findings: No erythema or rash.     Nails: There is no clubbing.  Neurological:      Mental Status: She is alert.     Diagnostics:    Spirometry was performed and demonstrated an FEV1 of 1.81 at 93 % of predicted.  Assessment and Plan:   1. Asthma, moderate persistent, well-controlled   2. Other allergic rhinitis   3. LPRD (laryngopharyngeal reflux disease)   4. Glossitis   5. Essential hypertension      1. Continue Symbicort 160 mcg  2 puffs 2 times a day   2. Continue omeprazole 20 mg in morning + famotidine 20 mg in evening   3. Continue Nasacort - 1 spray each  nostril 1 time per day during upper airway symptoms      4. Continue OTC Zyrtec 1 time per day if needed  5. Continue ProAir HFA 2 puffs every 4-6 hours if needed  6. Obtain a chest X-ray  7. For tongue issue:   A. Perform 'wine elimination' trial  B. Use a toothpaste with minimal flavoring  7. Establish if elevated blood pressure reading is a trend  8. Return to clinic in 6 months or earlier if problem  9. Obtain COVID vaccine when available  It does appear as though Demiah's airway is doing relatively well although I do not really know why she is having this intermittent wheeze at nighttime that does not respond to a short acting bronchodilator.  Will obtain a chest x-ray as it has been quite a few years since her last chest x-ray in investigation of airway issues.  Likewise, her glossitis appears to be somewhat of an enigma.  I did ask her to eliminate wine consumption for a few weeks to see if removing the alcohol exposure to her tongue will allow it to heal up better and eliminate any type of flavoring found in toothpaste as well.  Finally, she did have an elevated blood pressure reading today and she just needs to establish if that is a trend and follow-up with her primary care doctor regarding further management of this issue.  She will remain on anti-inflammatory agents for her airway and therapy directed against reflux.  I will see her back in this clinic in 6 months or earlier if there is a  problem.  Allena Katz, MD Allergy / Immunology [place]

## 2019-01-10 NOTE — Patient Instructions (Addendum)
  1. Continue Symbicort 160 mcg  2 puffs 2 times a day   2. Continue omeprazole 20 mg in morning + famotidine 20 mg in evening   3. Continue Nasacort - 1 spray each nostril 1 time per day during upper airway symptoms      4. Continue OTC Zyrtec 1 time per day if needed  5. Continue ProAir HFA 2 puffs every 4-6 hours if needed  6. Obtain a chest X-ray  7. For tongue issue:   A. Perform 'wine elimination' trial  B. Use a toothpaste with minimal flavoring  7. Establish if elevated blood pressure reading is a trend  8. Return to clinic in 6 months or earlier if problem  9. Obtain COVID vaccine when available

## 2019-01-11 ENCOUNTER — Encounter: Payer: Self-pay | Admitting: Allergy and Immunology

## 2019-01-18 ENCOUNTER — Ambulatory Visit
Admission: RE | Admit: 2019-01-18 | Discharge: 2019-01-18 | Disposition: A | Discharge status: Home / Self Care | Payer: Medicare Other | Source: Ambulatory Visit | Attending: Allergy and Immunology | Admitting: Allergy and Immunology

## 2019-01-18 ENCOUNTER — Other Ambulatory Visit: Payer: Self-pay

## 2019-01-18 DIAGNOSIS — J4540 Moderate persistent asthma, uncomplicated: Secondary | ICD-10-CM

## 2019-01-26 DIAGNOSIS — Z1231 Encounter for screening mammogram for malignant neoplasm of breast: Secondary | ICD-10-CM | POA: Diagnosis not present

## 2019-01-26 DIAGNOSIS — Z13820 Encounter for screening for osteoporosis: Secondary | ICD-10-CM | POA: Diagnosis not present

## 2019-02-09 DIAGNOSIS — Z20822 Contact with and (suspected) exposure to covid-19: Secondary | ICD-10-CM | POA: Diagnosis not present

## 2019-02-09 DIAGNOSIS — J45909 Unspecified asthma, uncomplicated: Secondary | ICD-10-CM | POA: Diagnosis not present

## 2019-02-11 ENCOUNTER — Emergency Department (HOSPITAL_COMMUNITY)
Admission: EM | Admit: 2019-02-11 | Discharge: 2019-02-11 | Disposition: A | Discharge status: Home / Self Care | Payer: Medicare Other | Attending: Emergency Medicine | Admitting: Emergency Medicine

## 2019-02-11 ENCOUNTER — Encounter (HOSPITAL_COMMUNITY): Payer: Self-pay

## 2019-02-11 ENCOUNTER — Other Ambulatory Visit: Payer: Self-pay

## 2019-02-11 DIAGNOSIS — J45909 Unspecified asthma, uncomplicated: Secondary | ICD-10-CM | POA: Insufficient documentation

## 2019-02-11 DIAGNOSIS — R457 State of emotional shock and stress, unspecified: Secondary | ICD-10-CM | POA: Diagnosis not present

## 2019-02-11 DIAGNOSIS — R0902 Hypoxemia: Secondary | ICD-10-CM | POA: Diagnosis not present

## 2019-02-11 DIAGNOSIS — I10 Essential (primary) hypertension: Secondary | ICD-10-CM | POA: Diagnosis not present

## 2019-02-11 DIAGNOSIS — F419 Anxiety disorder, unspecified: Secondary | ICD-10-CM | POA: Insufficient documentation

## 2019-02-11 DIAGNOSIS — Z79899 Other long term (current) drug therapy: Secondary | ICD-10-CM | POA: Diagnosis not present

## 2019-02-11 MED ORDER — ALPRAZOLAM 0.25 MG PO TABS
0.2500 mg | ORAL_TABLET | Freq: Once | ORAL | Status: AC
  Administered 2019-02-11: 17:00:00 0.25 mg via ORAL
  Filled 2019-02-11: qty 1

## 2019-02-11 NOTE — ED Provider Notes (Signed)
[facility] DEPT Provider Note   CSN: [id number] Arrival date & time: 02/11/19  1600     History Chief Complaint  Patient presents with  . Anxiety    Susan Li is a 79 y.o. female.  The history is provided by the patient.  Anxiety This is a recurrent problem. The current episode started 6 to 12 hours ago. The problem occurs constantly. The problem has not changed since onset.Pertinent negatives include no chest pain, no abdominal pain, no headaches and no shortness of breath. Associated symptoms comments: Has been without xanax dose for 3 days. Unable to get new rx until Monday. . Nothing aggravates the symptoms. Nothing relieves the symptoms. She has tried nothing for the symptoms. The treatment provided no relief.       Past Medical History:  Diagnosis Date  . Arthritis    hips,backs,hands; osteoarthritis  . Asthma    Symbicort as needed 3x week  . Bursitis   . Fibromyalgia   . GERD (gastroesophageal reflux disease)   . Insomnia   . Sciatica of right side   . Vitamin D deficiency     Patient Active Problem List   Diagnosis Date Noted  . Oral candida 11/18/2018  . Cervical radiculopathy 06/15/2018  . Moderate persistent asthma with acute exacerbation 01/21/2017  . Cough 01/21/2017  . Other allergic rhinitis 12/30/2016  . LPRD (laryngopharyngeal reflux disease) 12/30/2016  . Acute sinusitis 12/30/2016  . Asthma, well controlled 11/22/2014  . Allergic rhinitis due to pollen 11/22/2014    Past Surgical History:  Procedure Laterality Date  . ABDOMINAL HYSTERECTOMY  2000  . Buninonectomy  2008  . [place] SURGERY  2004  . COLONOSCOPY WITH PROPOFOL N/A 04/25/2012   Procedure: COLONOSCOPY WITH PROPOFOL;  Surgeon: Garlan Fair, MD;  Location: [facility];  Service: Endoscopy;  Laterality: N/A;  . LASIK  2004  . left shoulder surgery    . RESECTION DISTAL CLAVICAL Left 08/24/2014   Procedure: RESECTION DISTAL CLAVICAL;   Surgeon: Melrose Nakayama, MD;  Location: [facility];  Service: Orthopedics;  Laterality: Left;  . SHOULDER ARTHROSCOPY WITH SUBACROMIAL DECOMPRESSION Left 08/24/2014   Procedure: ARTHROSCOPY  LEFT SHOULDER, SUBACROMIAL DECOMPRESSION, DISTAL CLAVICLE RESECTION AND DEBRIDEMENT;  Surgeon: Melrose Nakayama, MD;  Location: [facility];  Service: Orthopedics;  Laterality: Left;  . Tendernitis Surgery  99     OB History   No obstetric history on file.     Family History  Problem Relation Age of Onset  . Healthy Mother   . Healthy Father   . Neuromuscular disorder Neg Hx     Social History   Tobacco Use  . Smoking status: Never Smoker  . Smokeless tobacco: Never Used  Substance Use Topics  . Alcohol use: Yes    Alcohol/week: 14.0 standard drinks    Types: 14 Glasses of wine per week  . Drug use: No    Home Medications Prior to Admission medications   Medication Sig Start Date End Date Taking? Authorizing Provider  albuterol (VENTOLIN HFA) 108 (90 Base) MCG/ACT inhaler USE 2 PUFFS EVERY 4 TO 6 HOURS AS NEEDED FOR COUGH/WHEEZING. Patient taking differently: Inhale 2 puffs into the lungs every 4 (four) hours as needed for wheezing or shortness of breath.  07/13/18   Kozlow, Donnamarie Poag, MD  ALPRAZolam Duanne Moron) 0.5 MG tablet Take 0.25 mg by mouth at bedtime.  05/25/14   [provider]  BIOTIN PO Take 5,000 mg by mouth  daily.     [provider]  budesonide-formoterol (SYMBICORT) 160-4.5 MCG/ACT inhaler USE 2 PUFFS INHALED EVERY 12 HOURS TO PREVENT COUGH OR WHEEZE-RINSE, GARGLE, SPIT AFTER USE. 01/07/18   Kozlow, Donnamarie Poag, MD  Calcium Citrate-Vitamin D (CALCIUM + D PO) Take 1,200 mg by mouth daily.    [provider]  cetirizine (ZYRTEC) 5 MG tablet Take 5 mg by mouth daily.    [provider]  estradiol (ESTRACE) 1 MG tablet Take 0.5 mg by mouth daily.     [provider]  famotidine (PEPCID) 20 MG tablet Take one tablet by  mouth daily at bedtime 07/13/18   Kozlow, Donnamarie Poag, MD  famotidine (PEPCID) 20 MG tablet Take 1 tablet (20 mg total) by mouth 2 (two) times daily. 11/18/18 12/19/18  Dara Hoyer, FNP  omeprazole (PRILOSEC) 20 MG capsule Take 1 capsule (20 mg total) by mouth daily. 07/13/18   Kozlow, Donnamarie Poag, MD  zolpidem (AMBIEN) 10 MG tablet Take 5 mg by mouth at bedtime.     [provider]    Allergies    Morphine and related  Review of Systems   Review of Systems  Constitutional: Negative for chills and fever.  HENT: Negative for ear pain and sore throat.   Eyes: Negative for pain and visual disturbance.  Respiratory: Negative for cough and shortness of breath.   Cardiovascular: Negative for chest pain and palpitations.  Gastrointestinal: Negative for abdominal pain and vomiting.  Genitourinary: Negative for dysuria and hematuria.  Musculoskeletal: Negative for arthralgias and back pain.  Skin: Negative for color change and rash.  Neurological: Negative for seizures, syncope and headaches.  Psychiatric/Behavioral: Negative for sleep disturbance and suicidal ideas. The patient is nervous/anxious. The patient is not hyperactive.   All other systems reviewed and are negative.   Physical Exam Updated Vital Signs  ED Triage Vitals  Enc Vitals Group     BP 02/11/19 1612 (!) 154/96     Pulse Rate 02/11/19 1612 93     Resp 02/11/19 1612 (!) 21     Temp 02/11/19 1612 98.5 F (36.9 C)     Temp Source 02/11/19 1612 Oral     SpO2 02/11/19 1604 96 %     Weight 02/11/19 1615 150 lb (68 kg)     Height 02/11/19 1615 5\' 3"  (1.6 m)     Head Circumference --      Peak Flow --      Pain Score 02/11/19 1614 0     Pain Loc --      Pain Edu? --      Excl. in [place]? --     Physical Exam Vitals and nursing note reviewed.  Constitutional:      General: She is not in acute distress.    Appearance: She is well-developed.  HENT:     Head: Normocephalic and atraumatic.  Eyes:     Conjunctiva/sclera:  Conjunctivae normal.  Cardiovascular:     Rate and Rhythm: Normal rate and regular rhythm.     Heart sounds: No murmur.  Pulmonary:     Effort: Pulmonary effort is normal. No respiratory distress.     Breath sounds: Normal breath sounds.  Abdominal:     Palpations: Abdomen is soft.     Tenderness: There is no abdominal tenderness.  Musculoskeletal:     Cervical back: Neck supple.  Skin:    General: Skin is warm and dry.  Neurological:     Mental Status: She  is alert.  Psychiatric:        Attention and Perception: Attention normal.        Mood and Affect: Mood is anxious.        Speech: Speech normal.        Behavior: Behavior normal.        Thought Content: Thought content normal. Thought content does not include homicidal or suicidal ideation. Thought content does not include homicidal plan.        Cognition and Memory: Cognition normal.        Judgment: Judgment normal. Judgment is not impulsive.     ED Results / Procedures / Treatments   Labs (all labs ordered are listed, but only abnormal results are displayed) Labs Reviewed - No data to display  EKG None  Radiology No results found.  Procedures Procedures (including critical care time)  Medications Ordered in ED Medications  ALPRAZolam (XANAX) tablet 0.25 mg (0.25 mg Oral Given 02/11/19 1644)    ED Course  I have reviewed the triage vital signs and the nursing notes.  Pertinent labs & imaging results that were available during my care of the patient were reviewed by me and considered in my medical decision making (see chart for details).    MDM Rules/Calculators/A&P  Susan Li is a 79 year old female with history of asthma, cough who presents to the ED with anxiety type symptoms.  Patient with normal vitals.  No fever.  Patient takes both Ambien and Xanax for sleep.  Due to holidays and issues with getting her prescription filled she is unable to get her Xanax filled until Monday.  She has been without  her Xanax for 3 days.  She started having some panicking type symptoms.  No suicidal homicidal ideation.  Is able to fill the prescription Monday.  Overall patient just felt anxious at home.  Does not appear to have any withdrawal symptoms.  No tachycardia, no diaphoresis, no hallucinations, no seizures.  Will give home dose of Xanax and reevaluate and anticipate discharge to home.  Patient felt better after Xanax.  Discharged in the ED in good condition.  This chart was dictated using voice recognition software.  Despite best efforts to proofread,  errors can occur which can change the documentation meaning.    Final Clinical Impression(s) / ED Diagnoses Final diagnoses:  Anxiety    Rx / DC Orders ED Discharge Orders    None       Lennice Sites, DO 02/11/19 1828

## 2019-02-11 NOTE — ED Triage Notes (Signed)
Per EMS, Pt is coming from home. Pt normally takes Xanax every day. Pt has not been able to take her meds for 3 days, due to refill error. Pt is having severe anxiety today, feelings of uneasiness, and panic.

## 2019-02-12 ENCOUNTER — Other Ambulatory Visit: Payer: Self-pay

## 2019-02-12 ENCOUNTER — Emergency Department (HOSPITAL_COMMUNITY)
Admission: EM | Admit: 2019-02-12 | Discharge: 2019-02-12 | Disposition: A | Discharge status: Home / Self Care | Payer: Medicare Other | Attending: Emergency Medicine | Admitting: Emergency Medicine

## 2019-02-12 ENCOUNTER — Encounter (HOSPITAL_COMMUNITY): Payer: Self-pay | Admitting: Obstetrics and Gynecology

## 2019-02-12 DIAGNOSIS — R0689 Other abnormalities of breathing: Secondary | ICD-10-CM | POA: Diagnosis not present

## 2019-02-12 DIAGNOSIS — F419 Anxiety disorder, unspecified: Secondary | ICD-10-CM | POA: Diagnosis not present

## 2019-02-12 DIAGNOSIS — Z79899 Other long term (current) drug therapy: Secondary | ICD-10-CM | POA: Insufficient documentation

## 2019-02-12 DIAGNOSIS — J45909 Unspecified asthma, uncomplicated: Secondary | ICD-10-CM | POA: Insufficient documentation

## 2019-02-12 MED ORDER — ALPRAZOLAM 0.25 MG PO TABS
0.2500 mg | ORAL_TABLET | Freq: Once | ORAL | Status: AC
  Administered 2019-02-12: 0.25 mg via ORAL
  Filled 2019-02-12: qty 1

## 2019-02-12 NOTE — ED Provider Notes (Signed)
[facility] DEPT Provider Note   CSN: [id number] Arrival date & time: 02/12/19  2036     History Chief Complaint  Patient presents with  . Anxiety    Susan Li is a 79 y.o. female.  The history is provided by the patient.  Mental Health Problem Presenting symptoms: no agitation, no hallucinations, no self-mutilation and no suicidal thoughts   Presenting symptoms comment:  Anxiety Degree of incapacity (severity):  Mild Onset quality:  Gradual Timing:  Intermittent Progression:  Waxing and waning Chronicity:  Chronic Context: noncompliance (cant fill her home xanax until tomorrow. )   Treatment compliance:  All of the time Relieved by:  Nothing Worsened by:  Nothing Associated symptoms: anxiety   Associated symptoms: no abdominal pain and no chest pain   Risk factors: no hx of mental illness        Past Medical History:  Diagnosis Date  . Arthritis    hips,backs,hands; osteoarthritis  . Asthma    Symbicort as needed 3x week  . Bursitis   . Fibromyalgia   . GERD (gastroesophageal reflux disease)   . Insomnia   . Sciatica of right side   . Vitamin D deficiency     Patient Active Problem List   Diagnosis Date Noted  . Oral candida 11/18/2018  . Cervical radiculopathy 06/15/2018  . Moderate persistent asthma with acute exacerbation 01/21/2017  . Cough 01/21/2017  . Other allergic rhinitis 12/30/2016  . LPRD (laryngopharyngeal reflux disease) 12/30/2016  . Acute sinusitis 12/30/2016  . Asthma, well controlled 11/22/2014  . Allergic rhinitis due to pollen 11/22/2014    Past Surgical History:  Procedure Laterality Date  . ABDOMINAL HYSTERECTOMY  2000  . Buninonectomy  2008  . [place] SURGERY  2004  . COLONOSCOPY WITH PROPOFOL N/A 04/25/2012   Procedure: COLONOSCOPY WITH PROPOFOL;  Surgeon: Garlan Fair, MD;  Location: [facility];  Service: Endoscopy;  Laterality: N/A;  . LASIK  2004  . left shoulder surgery      . RESECTION DISTAL CLAVICAL Left 08/24/2014   Procedure: RESECTION DISTAL CLAVICAL;  Surgeon: Melrose Nakayama, MD;  Location: [facility];  Service: Orthopedics;  Laterality: Left;  . SHOULDER ARTHROSCOPY WITH SUBACROMIAL DECOMPRESSION Left 08/24/2014   Procedure: ARTHROSCOPY  LEFT SHOULDER, SUBACROMIAL DECOMPRESSION, DISTAL CLAVICLE RESECTION AND DEBRIDEMENT;  Surgeon: Melrose Nakayama, MD;  Location: [place];  Service: Orthopedics;  Laterality: Left;  . Tendernitis Surgery  99     OB History   No obstetric history on file.     Family History  Problem Relation Age of Onset  . Healthy Mother   . Healthy Father   . Neuromuscular disorder Neg Hx     Social History   Tobacco Use  . Smoking status: Never Smoker  . Smokeless tobacco: Never Used  Substance Use Topics  . Alcohol use: Yes    Alcohol/week: 14.0 standard drinks    Types: 14 Glasses of wine per week  . Drug use: No    Home Medications Prior to Admission medications   Medication Sig Start Date End Date Taking? Authorizing Provider  albuterol (VENTOLIN HFA) 108 (90 Base) MCG/ACT inhaler USE 2 PUFFS EVERY 4 TO 6 HOURS AS NEEDED FOR COUGH/WHEEZING. Patient taking differently: Inhale 2 puffs into the lungs every 4 (four) hours as needed for wheezing or shortness of breath.  07/13/18   Kozlow, Donnamarie Poag, MD  ALPRAZolam Duanne Moron) 0.5 MG tablet Take 0.25 mg by mouth at bedtime.  05/25/14   [provider]  BIOTIN PO Take 5,000 mg by mouth daily.     [provider]  budesonide-formoterol (SYMBICORT) 160-4.5 MCG/ACT inhaler USE 2 PUFFS INHALED EVERY 12 HOURS TO PREVENT COUGH OR WHEEZE-RINSE, GARGLE, SPIT AFTER USE. 01/07/18   Kozlow, Donnamarie Poag, MD  Calcium Citrate-Vitamin D (CALCIUM + D PO) Take 1,200 mg by mouth daily.    [provider]  cetirizine (ZYRTEC) 5 MG tablet Take 5 mg by mouth daily.    [provider]  estradiol (ESTRACE) 1 MG tablet Take 0.5 mg by mouth daily.      [provider]  famotidine (PEPCID) 20 MG tablet Take one tablet by mouth daily at bedtime 07/13/18   Kozlow, Donnamarie Poag, MD  famotidine (PEPCID) 20 MG tablet Take 1 tablet (20 mg total) by mouth 2 (two) times daily. 11/18/18 12/19/18  Dara Hoyer, FNP  omeprazole (PRILOSEC) 20 MG capsule Take 1 capsule (20 mg total) by mouth daily. 07/13/18   Kozlow, Donnamarie Poag, MD  zolpidem (AMBIEN) 10 MG tablet Take 5 mg by mouth at bedtime.     [provider]    Allergies    Morphine and related  Review of Systems   Review of Systems  Constitutional: Negative for chills and fever.  HENT: Negative for ear pain and sore throat.   Eyes: Negative for pain and visual disturbance.  Respiratory: Negative for cough and shortness of breath.   Cardiovascular: Negative for chest pain and palpitations.  Gastrointestinal: Negative for abdominal pain and vomiting.  Genitourinary: Negative for dysuria and hematuria.  Musculoskeletal: Negative for arthralgias and back pain.  Skin: Negative for color change and rash.  Neurological: Negative for seizures and syncope.  Psychiatric/Behavioral: Negative for agitation, behavioral problems, confusion, decreased concentration, dysphoric mood, hallucinations, self-injury, sleep disturbance and suicidal ideas. The patient is nervous/anxious. The patient is not hyperactive.   All other systems reviewed and are negative.   Physical Exam Updated Vital Signs BP (!) 154/109   Pulse (!) 114   Temp (!) 97.5 F (36.4 C) (Oral)   Resp 16   SpO2 95%   Physical Exam Vitals and nursing note reviewed.  Constitutional:      General: She is not in acute distress.    Appearance: She is well-developed.  HENT:     Head: Normocephalic and atraumatic.  Eyes:     Conjunctiva/sclera: Conjunctivae normal.  Cardiovascular:     Rate and Rhythm: Normal rate and regular rhythm.     Heart sounds: No murmur.  Pulmonary:     Effort: Pulmonary effort is normal. No respiratory  distress.     Breath sounds: Normal breath sounds.  Abdominal:     Palpations: Abdomen is soft.     Tenderness: There is no abdominal tenderness.  Musculoskeletal:     Cervical back: Neck supple.  Skin:    General: Skin is warm and dry.  Neurological:     General: No focal deficit present.     Mental Status: She is alert and oriented to person, place, and time.     Sensory: No sensory deficit.     Motor: No weakness.     Coordination: Coordination normal.     Gait: Gait normal.  Psychiatric:        Behavior: Behavior normal.        Thought Content: Thought content normal.        Judgment: Judgment normal.     Comments: Anxious  ED Results / Procedures / Treatments   Labs (all labs ordered are listed, but only abnormal results are displayed) Labs Reviewed - No data to display  EKG None  Radiology No results found.  Procedures Procedures (including critical care time)  Medications Ordered in ED Medications  ALPRAZolam Duanne Moron) tablet 0.25 mg (0.25 mg Oral Given 02/12/19 2108)    ED Course  I have reviewed the triage vital signs and the nursing notes.  Pertinent labs & imaging results that were available during my care of the patient were reviewed by me and considered in my medical decision making (see chart for details).    MDM Rules/Calculators/A&P  AMELAH ZIENTARA is a 79 year old female who presents to the ED with anxiety.  Patient with unremarkable vitals.  No fever.  I saw the patient yesterday for the same.  She is unable to fill her chronic Xanax prescription until tomorrow.  Felt better after Xanax that I gave her yesterday.  Had panic attack type symptoms tonight and cannot wait till tomorrow to fill her medications.  Patient was given a dose of Xanax here and feels much better.  Neurologically she is intact.  No concern for organic pathology as she has no chest pain, no shortness of breath, no neurological symptoms.  She continues to deny suicidal homicidal  ideation.  Discharged from ED in good condition.  Given return precautions. Patient states she will be able to fill medication tomorrow, she talked with pharmacy today.  This chart was dictated using voice recognition software.  Despite best efforts to proofread,  errors can occur which can change the documentation meaning.   Final Clinical Impression(s) / ED Diagnoses Final diagnoses:  Anxiety    Rx / DC Orders ED Discharge Orders    None       Lennice Sites, DO 02/12/19 2127

## 2019-02-12 NOTE — ED Triage Notes (Signed)
Per EMS: Patient coming from home with anxiety attack after not having her xanax. Patient seen for same yesterday

## 2019-02-20 ENCOUNTER — Telehealth: Payer: Self-pay | Admitting: Allergy and Immunology

## 2019-02-20 NOTE — Telephone Encounter (Signed)
Called patient and left a voicemail asking to return call. Need Rx information in order to initiate a PA for her Symbicort.

## 2019-02-20 NOTE — Telephone Encounter (Signed)
Patient came into the office to drop off a letter for Dr. Neldon Mc regarding her new insurance not covering Symbicort. Patient would like to know if there is an alternative or if an appeal could be processed for Symbicort.   Letter has been placed in the nurses station.  Please advise on the prescription.

## 2019-02-24 NOTE — Telephone Encounter (Signed)
Patient called back. She will bring updated insurance cards on Monday.

## 2019-02-24 NOTE — Telephone Encounter (Signed)
Left message for patient advising her of need for updated insurance info.

## 2019-02-27 ENCOUNTER — Telehealth: Payer: Self-pay | Admitting: *Deleted

## 2019-02-27 NOTE — Telephone Encounter (Signed)
error 

## 2019-02-27 NOTE — Telephone Encounter (Signed)
Patient called and states she will bring in the insurance cards tomorrow, 2/16, as the weather is still bad where she lives.

## 2019-03-13 ENCOUNTER — Telehealth: Payer: Self-pay | Admitting: Allergy and Immunology

## 2019-03-13 NOTE — Telephone Encounter (Signed)
Per fax from [employer] the prior authorization for Symbicort 160 was dismissed. The medication is on the formulary and the patient is able to get the medication as covered by her insurance. Brand name.

## 2019-03-13 NOTE — Telephone Encounter (Signed)
Received another fax from [employer] stating the Symbicort 160 had been approved until 01-12-2020. I am sending this to the scan center as well as to the pharmacy. I left a detailed message on patient's answering  machine letting her know this as well as to call back with any further questions.

## 2019-03-13 NOTE — Telephone Encounter (Signed)
error 

## 2019-03-13 NOTE — Telephone Encounter (Signed)
Paperwork faxed to patient's prescription benefit management company. I will await the approval/denial.

## 2019-03-13 NOTE — Telephone Encounter (Signed)
Patient called back today, checking on the status of this PA. She did bring in her insurance card and it is scanned in the system.

## 2019-03-21 ENCOUNTER — Ambulatory Visit: Payer: Medicare Other | Admitting: Allergy & Immunology

## 2019-03-23 DIAGNOSIS — F324 Major depressive disorder, single episode, in partial remission: Secondary | ICD-10-CM | POA: Diagnosis not present

## 2019-03-23 DIAGNOSIS — G479 Sleep disorder, unspecified: Secondary | ICD-10-CM | POA: Diagnosis not present

## 2019-04-19 DIAGNOSIS — M545 Low back pain: Secondary | ICD-10-CM | POA: Diagnosis not present

## 2019-04-20 DIAGNOSIS — H2513 Age-related nuclear cataract, bilateral: Secondary | ICD-10-CM | POA: Diagnosis not present

## 2019-04-20 DIAGNOSIS — H52203 Unspecified astigmatism, bilateral: Secondary | ICD-10-CM | POA: Diagnosis not present

## 2019-04-20 DIAGNOSIS — H5213 Myopia, bilateral: Secondary | ICD-10-CM | POA: Diagnosis not present

## 2019-04-20 DIAGNOSIS — D23111 Other benign neoplasm of skin of right upper eyelid, including canthus: Secondary | ICD-10-CM | POA: Diagnosis not present

## 2019-04-24 DIAGNOSIS — M5416 Radiculopathy, lumbar region: Secondary | ICD-10-CM | POA: Diagnosis not present

## 2019-04-27 DIAGNOSIS — M5416 Radiculopathy, lumbar region: Secondary | ICD-10-CM | POA: Diagnosis not present

## 2019-05-02 DIAGNOSIS — M5416 Radiculopathy, lumbar region: Secondary | ICD-10-CM | POA: Diagnosis not present

## 2019-05-04 DIAGNOSIS — M5416 Radiculopathy, lumbar region: Secondary | ICD-10-CM | POA: Diagnosis not present

## 2019-05-09 DIAGNOSIS — M5416 Radiculopathy, lumbar region: Secondary | ICD-10-CM | POA: Diagnosis not present

## 2019-05-11 DIAGNOSIS — M5416 Radiculopathy, lumbar region: Secondary | ICD-10-CM | POA: Diagnosis not present

## 2019-05-15 DIAGNOSIS — D23111 Other benign neoplasm of skin of right upper eyelid, including canthus: Secondary | ICD-10-CM | POA: Diagnosis not present

## 2019-05-16 DIAGNOSIS — M5416 Radiculopathy, lumbar region: Secondary | ICD-10-CM | POA: Diagnosis not present

## 2019-05-17 DIAGNOSIS — M5416 Radiculopathy, lumbar region: Secondary | ICD-10-CM | POA: Diagnosis not present

## 2019-05-17 DIAGNOSIS — M1611 Unilateral primary osteoarthritis, right hip: Secondary | ICD-10-CM | POA: Diagnosis not present

## 2019-05-18 DIAGNOSIS — M5416 Radiculopathy, lumbar region: Secondary | ICD-10-CM | POA: Diagnosis not present

## 2019-05-27 DIAGNOSIS — M545 Low back pain: Secondary | ICD-10-CM | POA: Diagnosis not present

## 2019-05-27 DIAGNOSIS — M25551 Pain in right hip: Secondary | ICD-10-CM | POA: Diagnosis not present

## 2019-05-31 DIAGNOSIS — M545 Low back pain: Secondary | ICD-10-CM | POA: Diagnosis not present

## 2019-05-31 DIAGNOSIS — M25551 Pain in right hip: Secondary | ICD-10-CM | POA: Diagnosis not present

## 2019-06-12 ENCOUNTER — Other Ambulatory Visit: Payer: Self-pay | Admitting: Allergy and Immunology

## 2019-07-11 ENCOUNTER — Other Ambulatory Visit: Payer: Self-pay

## 2019-07-11 ENCOUNTER — Encounter: Payer: Self-pay | Admitting: Allergy and Immunology

## 2019-07-11 ENCOUNTER — Ambulatory Visit (INDEPENDENT_AMBULATORY_CARE_PROVIDER_SITE_OTHER): Payer: Medicare Other | Admitting: Allergy and Immunology

## 2019-07-11 VITALS — BP 102/80 | HR 103 | Resp 18 | Ht 63.0 in | Wt 158.6 lb

## 2019-07-11 DIAGNOSIS — K219 Gastro-esophageal reflux disease without esophagitis: Secondary | ICD-10-CM

## 2019-07-11 DIAGNOSIS — J4540 Moderate persistent asthma, uncomplicated: Secondary | ICD-10-CM | POA: Diagnosis not present

## 2019-07-11 DIAGNOSIS — J3089 Other allergic rhinitis: Secondary | ICD-10-CM | POA: Diagnosis not present

## 2019-07-11 NOTE — Patient Instructions (Addendum)
  1. Continue Symbicort 160 mcg  2 puffs 2 times a day   2. Continue omeprazole 20 mg in morning + famotidine 20 mg in evening   3. Continue Nasacort - 1 spray each nostril 1 time per day during upper airway symptoms      4. Continue OTC Zyrtec 1 time per day if needed  5. Continue ProAir HFA 2 puffs every 4-6 hours if needed  6. Return to clinic in 6 months or earlier if problem  7. Obtain fall flu vaccine

## 2019-07-11 NOTE — Progress Notes (Signed)
[facility]   Follow-up Note  Referring Provider: Josetta Huddle, MD Primary Provider: Josetta Huddle, MD Date of Office Visit: 07/11/2019  Subjective:   Susan Li (DOB: September 13, 1940) is a 79 y.o. female who returns to the [facility] on 07/11/2019 in re-evaluation of the following:  HPI: Makynleigh returns to this clinic in reevaluation of asthma and allergic rhinitis and reflux and a history of scalding mouth syndrome.  Her last visit to this clinic was 10 January 2019.  She has really done well with her airway while consistently using her Symbicort.  She has not required a systemic steroid to treat an exacerbation.  She does not really exert herself to any significant degree because of her back pain issue which is being evaluated by her orthopedic surgeon and she is scheduled to have steroid injections.  Her requirement for short acting bronchodilator is occasionally whenever she hears a wheeze but it sounds as though it may be less than 1 time per week.  She had very little issues with her nose.  She has not required an antibiotic to treat an episode of sinusitis.  Her reflux has been under excellent control.  When I last saw her in his clinic she was having a scalding mouth syndrome and we had her switch her toothpaste to a bubblegum flavored toothpaste and cut back on her wine consumption and her mouth issue has resolved.  She has received 2 Pfizer Covid vaccinations.  She is now using clonazepam and Ambien for sleep dysfunction and anxiety.  Allergies as of 07/11/2019      Reactions   Morphine And Related Itching   Itching nose      Medication List    ALPRAZolam 0.5 MG tablet Commonly known as: XANAX Take 0.25 mg by mouth at bedtime.   BIOTIN PO Take 5,000 mg by mouth daily.   budesonide-formoterol 160-4.5 MCG/ACT inhaler Commonly known as: SYMBICORT USE 2 PUFFS TWICE DAILY.   CALCIUM + D PO Take 1,200 mg  by mouth daily.   cetirizine 5 MG tablet Commonly known as: ZYRTEC Take 5 mg by mouth daily.   clonazePAM 0.5 MG tablet Commonly known as: KLONOPIN Take 0.5 mg by mouth daily as needed.   estradiol 1 MG tablet Commonly known as: ESTRACE Take 0.5 mg by mouth daily.   famotidine 20 MG tablet Commonly known as: Pepcid Take one tablet by mouth daily at bedtime   neomycin-polymyxin b-dexamethasone 3.5-10000-0.1 Oint Commonly known as: MAXITROL   omeprazole 20 MG capsule Commonly known as: PRILOSEC Take 1 capsule (20 mg total) by mouth daily.   zolpidem 10 MG tablet Commonly known as: AMBIEN Take 5 mg by mouth at bedtime.       Past Medical History:  Diagnosis Date  . Arthritis    hips,backs,hands; osteoarthritis  . Asthma    Symbicort as needed 3x week  . Bursitis   . Fibromyalgia   . GERD (gastroesophageal reflux disease)   . Insomnia   . Sciatica of right side   . Vitamin D deficiency     Past Surgical History:  Procedure Laterality Date  . ABDOMINAL HYSTERECTOMY  2000  . Buninonectomy  2008  . [place] SURGERY  2004  . COLONOSCOPY WITH PROPOFOL N/A 04/25/2012   Procedure: COLONOSCOPY WITH PROPOFOL;  Surgeon: Garlan Fair, MD;  Location: [facility];  Service: Endoscopy;  Laterality: N/A;  . LASIK  2004  . left shoulder  surgery    . RESECTION DISTAL CLAVICAL Left 08/24/2014   Procedure: RESECTION DISTAL CLAVICAL;  Surgeon: Melrose Nakayama, MD;  Location: [facility];  Service: Orthopedics;  Laterality: Left;  . SHOULDER ARTHROSCOPY WITH SUBACROMIAL DECOMPRESSION Left 08/24/2014   Procedure: ARTHROSCOPY  LEFT SHOULDER, SUBACROMIAL DECOMPRESSION, DISTAL CLAVICLE RESECTION AND DEBRIDEMENT;  Surgeon: Melrose Nakayama, MD;  Location: [facility];  Service: Orthopedics;  Laterality: Left;  . Tendernitis Surgery  99    Review of systems negative except as noted in HPI / PMHx or noted below:  Review of Systems  Constitutional:  Negative.   HENT: Negative.   Eyes: Negative.   Respiratory: Negative.   Cardiovascular: Negative.   Gastrointestinal: Negative.   Genitourinary: Negative.   Musculoskeletal: Negative.   Skin: Negative.   Neurological: Negative.   Endo/Heme/Allergies: Negative.   Psychiatric/Behavioral: Negative.      Objective:   Vitals:   07/11/19 1509  BP: 102/80  Pulse: (!) 103  Resp: 18  SpO2: 97%   Height: 5\' 3"  (160 cm)  Weight: 158 lb 9.6 oz (71.9 kg)   Physical Exam Constitutional:      Appearance: She is not diaphoretic.  HENT:     Head: Normocephalic.     Right Ear: Tympanic membrane, ear canal and external ear normal.     Left Ear: Tympanic membrane, ear canal and external ear normal.     Nose: Nose normal. No mucosal edema or rhinorrhea.     Mouth/Throat:     Pharynx: Uvula midline. No oropharyngeal exudate.  Eyes:     Conjunctiva/sclera: Conjunctivae normal.  Neck:     Thyroid: No thyromegaly.     Trachea: Trachea normal. No tracheal tenderness or tracheal deviation.  Cardiovascular:     Rate and Rhythm: Normal rate and regular rhythm.     Heart sounds: Normal heart sounds, S1 normal and S2 normal. No murmur heard.   Pulmonary:     Effort: No respiratory distress.     Breath sounds: Normal breath sounds. No stridor. No wheezing or rales.  Lymphadenopathy:     Head:     Right side of head: No tonsillar adenopathy.     Left side of head: No tonsillar adenopathy.     Cervical: No cervical adenopathy.  Skin:    Findings: No erythema or rash.     Nails: There is no clubbing.  Neurological:     Mental Status: She is alert.     Diagnostics:    Spirometry was performed and demonstrated an FEV1 of 1.54 at 79 % of predicted.  The patient had an Asthma Control Test with the following results: ACT Total Score: 19.    Assessment and Plan:   1. Asthma, moderate persistent, well-controlled   2. Other allergic rhinitis   3. LPRD (laryngopharyngeal reflux disease)       1. Continue Symbicort 160 mcg  2 puffs 2 times a day   2. Continue omeprazole 20 mg in morning + famotidine 20 mg in evening   3. Continue Nasacort - 1 spray each nostril 1 time per day during upper airway symptoms      4. Continue OTC Zyrtec 1 time per day if needed  5. Continue ProAir HFA 2 puffs every 4-6 hours if needed  6. Return to clinic in 6 months or earlier if problem  7. Obtain fall flu vaccine  Jeani appears to be doing quite well on her current therapy and we will continue to have  her use anti-inflammatory medications for her airway and therapy directed against reflux as noted above.  Assuming she does well with this plan I will see her back in this clinic in 6 months or earlier if there is a problem.  Allena Katz, MD Allergy / Immunology [place]

## 2019-07-12 ENCOUNTER — Encounter: Payer: Self-pay | Admitting: Allergy and Immunology

## 2019-07-12 MED ORDER — ALBUTEROL SULFATE HFA 108 (90 BASE) MCG/ACT IN AERS: 2.0000 | INHALATION_SPRAY | RESPIRATORY_TRACT | 0 refills | Status: DC | PRN

## 2019-07-12 MED ORDER — FAMOTIDINE 20 MG PO TABS: ORAL_TABLET | ORAL | 1 refills | Status: DC

## 2019-07-12 MED ORDER — OMEPRAZOLE 20 MG PO CPDR: 20.0000 mg | DELAYED_RELEASE_CAPSULE | Freq: Every day | ORAL | 1 refills | Status: DC

## 2019-07-12 MED ORDER — BUDESONIDE-FORMOTEROL FUMARATE 160-4.5 MCG/ACT IN AERO: INHALATION_SPRAY | RESPIRATORY_TRACT | 1 refills | Status: DC

## 2019-08-01 DIAGNOSIS — M533 Sacrococcygeal disorders, not elsewhere classified: Secondary | ICD-10-CM | POA: Diagnosis not present

## 2019-08-22 DIAGNOSIS — M533 Sacrococcygeal disorders, not elsewhere classified: Secondary | ICD-10-CM | POA: Diagnosis not present

## 2019-09-15 DIAGNOSIS — M47816 Spondylosis without myelopathy or radiculopathy, lumbar region: Secondary | ICD-10-CM | POA: Diagnosis not present

## 2019-10-10 DIAGNOSIS — M47816 Spondylosis without myelopathy or radiculopathy, lumbar region: Secondary | ICD-10-CM | POA: Diagnosis not present

## 2019-10-12 DIAGNOSIS — G479 Sleep disorder, unspecified: Secondary | ICD-10-CM | POA: Diagnosis not present

## 2019-10-12 DIAGNOSIS — G4700 Insomnia, unspecified: Secondary | ICD-10-CM | POA: Diagnosis not present

## 2019-10-12 DIAGNOSIS — M79659 Pain in unspecified thigh: Secondary | ICD-10-CM | POA: Diagnosis not present

## 2019-10-12 DIAGNOSIS — M545 Low back pain: Secondary | ICD-10-CM | POA: Diagnosis not present

## 2019-10-12 DIAGNOSIS — F324 Major depressive disorder, single episode, in partial remission: Secondary | ICD-10-CM | POA: Diagnosis not present

## 2019-10-22 DIAGNOSIS — R110 Nausea: Secondary | ICD-10-CM | POA: Diagnosis not present

## 2019-10-22 DIAGNOSIS — R457 State of emotional shock and stress, unspecified: Secondary | ICD-10-CM | POA: Diagnosis not present

## 2019-10-22 DIAGNOSIS — I10 Essential (primary) hypertension: Secondary | ICD-10-CM | POA: Diagnosis not present

## 2019-10-22 DIAGNOSIS — R000 Tachycardia, unspecified: Secondary | ICD-10-CM | POA: Diagnosis not present

## 2019-10-23 DIAGNOSIS — F419 Anxiety disorder, unspecified: Secondary | ICD-10-CM | POA: Diagnosis not present

## 2019-10-25 DIAGNOSIS — Z23 Encounter for immunization: Secondary | ICD-10-CM | POA: Diagnosis not present

## 2019-10-30 DIAGNOSIS — M47816 Spondylosis without myelopathy or radiculopathy, lumbar region: Secondary | ICD-10-CM | POA: Diagnosis not present

## 2019-11-22 DIAGNOSIS — M47816 Spondylosis without myelopathy or radiculopathy, lumbar region: Secondary | ICD-10-CM | POA: Diagnosis not present

## 2019-11-23 DIAGNOSIS — F322 Major depressive disorder, single episode, severe without psychotic features: Secondary | ICD-10-CM | POA: Diagnosis not present

## 2019-11-23 DIAGNOSIS — F419 Anxiety disorder, unspecified: Secondary | ICD-10-CM | POA: Diagnosis not present

## 2019-12-09 ENCOUNTER — Encounter (HOSPITAL_COMMUNITY): Payer: Self-pay

## 2019-12-09 ENCOUNTER — Emergency Department (HOSPITAL_COMMUNITY)
Admission: EM | Admit: 2019-12-09 | Discharge: 2019-12-09 | Disposition: A | Discharge status: Home / Self Care | Payer: Medicare Other | Attending: Emergency Medicine | Admitting: Emergency Medicine

## 2019-12-09 ENCOUNTER — Ambulatory Visit (HOSPITAL_COMMUNITY)
Admission: EM | Admit: 2019-12-09 | Discharge: 2019-12-09 | Disposition: A | Discharge status: Home / Self Care | Payer: Medicare Other

## 2019-12-09 ENCOUNTER — Other Ambulatory Visit: Payer: Self-pay

## 2019-12-09 DIAGNOSIS — R5383 Other fatigue: Secondary | ICD-10-CM

## 2019-12-09 DIAGNOSIS — Z20822 Contact with and (suspected) exposure to covid-19: Secondary | ICD-10-CM | POA: Diagnosis not present

## 2019-12-09 DIAGNOSIS — R197 Diarrhea, unspecified: Secondary | ICD-10-CM

## 2019-12-09 DIAGNOSIS — J4541 Moderate persistent asthma with (acute) exacerbation: Secondary | ICD-10-CM | POA: Diagnosis not present

## 2019-12-09 DIAGNOSIS — R630 Anorexia: Secondary | ICD-10-CM | POA: Diagnosis not present

## 2019-12-09 DIAGNOSIS — E860 Dehydration: Secondary | ICD-10-CM | POA: Diagnosis not present

## 2019-12-09 DIAGNOSIS — M6281 Muscle weakness (generalized): Secondary | ICD-10-CM

## 2019-12-09 DIAGNOSIS — E876 Hypokalemia: Secondary | ICD-10-CM

## 2019-12-09 DIAGNOSIS — M791 Myalgia, unspecified site: Secondary | ICD-10-CM | POA: Diagnosis not present

## 2019-12-09 DIAGNOSIS — R109 Unspecified abdominal pain: Secondary | ICD-10-CM | POA: Diagnosis not present

## 2019-12-09 DIAGNOSIS — R110 Nausea: Secondary | ICD-10-CM

## 2019-12-09 DIAGNOSIS — R519 Headache, unspecified: Secondary | ICD-10-CM | POA: Diagnosis not present

## 2019-12-09 DIAGNOSIS — K529 Noninfective gastroenteritis and colitis, unspecified: Secondary | ICD-10-CM | POA: Diagnosis not present

## 2019-12-09 LAB — CBC WITH DIFFERENTIAL/PLATELET
Abs Immature Granulocytes: 0.02 10*3/uL (ref 0.00–0.07)
Basophils Absolute: 0 10*3/uL (ref 0.0–0.1)
Basophils Relative: 1 %
Eosinophils Absolute: 0.1 10*3/uL (ref 0.0–0.5)
Eosinophils Relative: 2 %
HCT: 36.6 % (ref 36.0–46.0)
Hemoglobin: 12.6 g/dL (ref 12.0–15.0)
Immature Granulocytes: 0 %
Lymphocytes Relative: 18 %
Lymphs Abs: 1.1 10*3/uL (ref 0.7–4.0)
MCH: 33.8 pg (ref 26.0–34.0)
MCHC: 34.4 g/dL (ref 30.0–36.0)
MCV: 98.1 fL (ref 80.0–100.0)
Monocytes Absolute: 0.8 10*3/uL (ref 0.1–1.0)
Monocytes Relative: 13 %
Neutro Abs: 3.9 10*3/uL (ref 1.7–7.7)
Neutrophils Relative %: 66 %
Platelets: 302 10*3/uL (ref 150–400)
RBC: 3.73 MIL/uL — ABNORMAL LOW (ref 3.87–5.11)
RDW: 12.7 % (ref 11.5–15.5)
WBC: 5.9 10*3/uL (ref 4.0–10.5)
nRBC: 0 % (ref 0.0–0.2)

## 2019-12-09 LAB — COMPREHENSIVE METABOLIC PANEL
ALT: 29 U/L (ref 0–44)
AST: 25 U/L (ref 15–41)
Albumin: 3.9 g/dL (ref 3.5–5.0)
Alkaline Phosphatase: 52 U/L (ref 38–126)
Anion gap: 11 (ref 5–15)
BUN: 12 mg/dL (ref 8–23)
CO2: 18 mmol/L — ABNORMAL LOW (ref 22–32)
Calcium: 8.8 mg/dL — ABNORMAL LOW (ref 8.9–10.3)
Chloride: 107 mmol/L (ref 98–111)
Creatinine, Ser: 0.51 mg/dL (ref 0.44–1.00)
GFR, Estimated: >60 mL/min (ref >60)
Glucose, Bld: 99 mg/dL (ref 70–99)
Potassium: 2.8 mmol/L — ABNORMAL LOW (ref 3.5–5.1)
Sodium: 136 mmol/L (ref 135–145)
Total Bilirubin: 0.7 mg/dL (ref 0.3–1.2)
Total Protein: 6.7 g/dL (ref 6.5–8.1)

## 2019-12-09 LAB — URINALYSIS, ROUTINE W REFLEX MICROSCOPIC
Bilirubin Urine: NEGATIVE
Glucose, UA: NEGATIVE mg/dL
Hgb urine dipstick: NEGATIVE
Ketones, ur: 20 mg/dL — AB
Leukocytes,Ua: NEGATIVE
Nitrite: NEGATIVE
Protein, ur: NEGATIVE mg/dL
Specific Gravity, Urine: 1.012 (ref 1.005–1.030)
pH: 6 (ref 5.0–8.0)

## 2019-12-09 LAB — RESP PANEL BY RT-PCR (FLU A&B, COVID) ARPGX2
Influenza A by PCR: NEGATIVE
Influenza B by PCR: NEGATIVE
SARS Coronavirus 2 by RT PCR: NEGATIVE

## 2019-12-09 MED ORDER — LACTATED RINGERS IV BOLUS
1000.0000 mL | Freq: Once | INTRAVENOUS | Status: AC
  Administered 2019-12-09: 1000 mL via INTRAVENOUS

## 2019-12-09 MED ORDER — LOPERAMIDE HCL 2 MG PO CAPS: 2.0000 mg | ORAL_CAPSULE | Freq: Four times a day (QID) | ORAL | 0 refills | Status: DC | PRN

## 2019-12-09 MED ORDER — POTASSIUM CHLORIDE CRYS ER 20 MEQ PO TBCR: 20.0000 meq | EXTENDED_RELEASE_TABLET | Freq: Two times a day (BID) | ORAL | 0 refills | Status: DC

## 2019-12-09 MED ORDER — POTASSIUM CHLORIDE 10 MEQ/100ML IV SOLN
10.0000 meq | Freq: Once | INTRAVENOUS | Status: AC
  Administered 2019-12-09: 10 meq via INTRAVENOUS
  Filled 2019-12-09: qty 100

## 2019-12-09 NOTE — Discharge Instructions (Signed)
Continue to decrease the sertraline.  Take the potassium and also take the Imodium as needed.

## 2019-12-09 NOTE — ED Triage Notes (Signed)
Pt presents with fatigue, nausea, and diarrhea x 1 week.

## 2019-12-09 NOTE — ED Notes (Signed)
Son did bring patient to ucc.  Son spoke with stephanie, np about patient.  Patient to go to ED of their choosing

## 2019-12-09 NOTE — ED Notes (Signed)
Patient is being discharged from the Urgent Care and sent to the Emergency Department via private vehicle . Per stephanie matthews, np, patient is in need of higher level of care due to complaint and severity. Patient is aware and verbalizes understanding of plan of care.  Vitals:   12/09/19 1221  BP: 135/83  Pulse: 80  Resp: 17  Temp: 98.3 F (36.8 C)  SpO2: 98%

## 2019-12-09 NOTE — ED Provider Notes (Signed)
[place]    CSN: [id number] Arrival date & time: 12/09/19  1117      History   Chief Complaint Chief Complaint  Patient presents with  . Diarrhea  . Nausea  . Fatigue    HPI Susan Li is a 79 y.o. female.   Reports that she has had diarrhea for the last 2 weeks every time that she has to have a bowel movement.  Reports that they are now watery and yellow/orange in color.  Reports that she is feeling very weak and fatigued.  Reports headaches, muscle aches, nausea as well.  Has taken Imodium with little benefit.  Denies sick contacts around her.  Patient also reports that she started taking Zoloft 2 weeks ago.  Is attributing her symptoms to this.  States that she is weaning herself off of Zoloft at this time.  She was prescribed 50 mg Zoloft daily, she started taking half of a 50 mg tablet yesterday to wean herself off.  Denies fever, rash, cough, shortness of breath, other symptoms.  ROS per HPI  The history is provided by the patient.  Diarrhea Quality:  Watery Severity:  Severe Duration:  2 weeks Timing:  Constant Progression:  Unchanged Relieved by:  Nothing Worsened by:  Nothing Ineffective treatments:  Anti-motility medications Associated symptoms: abdominal pain and myalgias     Past Medical History:  Diagnosis Date  . Arthritis    hips,backs,hands; osteoarthritis  . Asthma    Symbicort as needed 3x week  . Bursitis   . Fibromyalgia   . GERD (gastroesophageal reflux disease)   . Insomnia   . Sciatica of right side   . Vitamin D deficiency     Patient Active Problem List   Diagnosis Date Noted  . Oral candida 11/18/2018  . Cervical radiculopathy 06/15/2018  . Moderate persistent asthma with acute exacerbation 01/21/2017  . Cough 01/21/2017  . Other allergic rhinitis 12/30/2016  . LPRD (laryngopharyngeal reflux disease) 12/30/2016  . Acute sinusitis 12/30/2016  . Asthma, well controlled 11/22/2014  . Allergic rhinitis due to pollen  11/22/2014    Past Surgical History:  Procedure Laterality Date  . ABDOMINAL HYSTERECTOMY  2000  . Buninonectomy  2008  . [place] SURGERY  2004  . COLONOSCOPY WITH PROPOFOL N/A 04/25/2012   Procedure: COLONOSCOPY WITH PROPOFOL;  Surgeon: Garlan Fair, MD;  Location: [facility];  Service: Endoscopy;  Laterality: N/A;  . LASIK  2004  . left shoulder surgery    . RESECTION DISTAL CLAVICAL Left 08/24/2014   Procedure: RESECTION DISTAL CLAVICAL;  Surgeon: Melrose Nakayama, MD;  Location: [facility];  Service: Orthopedics;  Laterality: Left;  . SHOULDER ARTHROSCOPY WITH SUBACROMIAL DECOMPRESSION Left 08/24/2014   Procedure: ARTHROSCOPY  LEFT SHOULDER, SUBACROMIAL DECOMPRESSION, DISTAL CLAVICLE RESECTION AND DEBRIDEMENT;  Surgeon: Melrose Nakayama, MD;  Location: [facility];  Service: Orthopedics;  Laterality: Left;  . Tendernitis Surgery  99    OB History   No obstetric history on file.      Home Medications    Prior to Admission medications   Medication Sig Start Date End Date Taking? Authorizing Provider  albuterol (VENTOLIN HFA) 108 (90 Base) MCG/ACT inhaler Inhale 2 puffs into the lungs every 4 (four) hours as needed for wheezing or shortness of breath. 07/12/19   Kozlow, Donnamarie Poag, MD  ALPRAZolam Duanne Moron) 0.5 MG tablet Take 0.25 mg by mouth at bedtime.  05/25/14   [provider]  BIOTIN PO Take 5,000  mg by mouth daily.     [provider]  budesonide-formoterol (SYMBICORT) 160-4.5 MCG/ACT inhaler USE 2 PUFFS TWICE DAILY. 07/12/19   Kozlow, Donnamarie Poag, MD  Calcium Citrate-Vitamin D (CALCIUM + D PO) Take 1,200 mg by mouth daily.    [provider]  cetirizine (ZYRTEC) 5 MG tablet Take 5 mg by mouth daily.    [provider]  clonazePAM (KLONOPIN) 0.5 MG tablet Take 0.5 mg by mouth daily as needed. 05/23/19   [provider]  estradiol (ESTRACE) 1 MG tablet Take 0.5 mg by mouth daily.     [provider]    famotidine (PEPCID) 20 MG tablet Take one tablet by mouth daily at bedtime 07/12/19   Kozlow, Donnamarie Poag, MD  neomycin-polymyxin b-dexamethasone (MAXITROL) 3.5-10000-0.1 OINT  05/15/19   [provider]  omeprazole (PRILOSEC) 20 MG capsule Take 1 capsule (20 mg total) by mouth daily. 07/12/19   Kozlow, Donnamarie Poag, MD  zolpidem (AMBIEN) 10 MG tablet Take 5 mg by mouth at bedtime.     [provider]    Family History Family History  Problem Relation Age of Onset  . Healthy Mother   . Healthy Father   . Neuromuscular disorder Neg Hx     Social History Social History   Tobacco Use  . Smoking status: Never Smoker  . Smokeless tobacco: Never Used  Vaping Use  . Vaping Use: Never used  Substance Use Topics  . Alcohol use: Yes    Alcohol/week: 14.0 standard drinks    Types: 14 Glasses of wine per week  . Drug use: No     Allergies   Morphine and related   Review of Systems Review of Systems  Gastrointestinal: Positive for abdominal pain and diarrhea.  Musculoskeletal: Positive for myalgias.     Physical Exam Triage Vital Signs ED Triage Vitals  Enc Vitals Group     BP 12/09/19 1221 135/83     Pulse Rate 12/09/19 1221 80     Resp 12/09/19 1221 17     Temp 12/09/19 1221 98.3 F (36.8 C)     Temp Source 12/09/19 1221 Oral     SpO2 12/09/19 1221 98 %     Weight --      Height --      Head Circumference --      Peak Flow --      Pain Score 12/09/19 1219 2     Pain Loc --      Pain Edu? --      Excl. in [place]? --    No data found.  Updated Vital Signs BP 135/83 (BP Location: Right Arm)   Pulse 80   Temp 98.3 F (36.8 C) (Oral)   Resp 17   SpO2 98%   Visual Acuity Right Eye Distance:   Left Eye Distance:   Bilateral Distance:    Right Eye Near:   Left Eye Near:    Bilateral Near:     Physical Exam Vitals and nursing note reviewed.  Constitutional:      General: She is not in acute distress.    Appearance: She is well-developed. She is  ill-appearing.  HENT:     Head: Normocephalic and atraumatic.  Eyes:     Conjunctiva/sclera: Conjunctivae normal.  Cardiovascular:     Rate and Rhythm: Normal rate and regular rhythm.     Heart sounds: Normal heart sounds. No murmur heard.   Pulmonary:     Effort: Pulmonary effort  is normal. No respiratory distress.     Breath sounds: Normal breath sounds. No stridor. No wheezing, rhonchi or rales.  Chest:     Chest wall: No tenderness.  Abdominal:     General: Abdomen is flat. Bowel sounds are increased. There is no distension or abdominal bruit. There are no signs of injury.     Palpations: Abdomen is soft. There is no shifting dullness, fluid wave, hepatomegaly, splenomegaly, mass or pulsatile mass.     Tenderness: There is generalized abdominal tenderness. There is no right CVA tenderness, left CVA tenderness, guarding or rebound. Negative signs include Murphy's sign, Rovsing's sign, McBurney's sign, psoas sign and obturator sign.     Hernia: No hernia is present.  Musculoskeletal:     Cervical back: Normal range of motion and neck supple.  Skin:    General: Skin is warm and dry.     Capillary Refill: Capillary refill takes 2 to 3 seconds.     Comments: Tenting with skin turgor to dorsal surface of the palm  Neurological:     General: No focal deficit present.     Mental Status: She is alert and oriented to person, place, and time.  Psychiatric:        Mood and Affect: Mood normal.        Behavior: Behavior normal.        Thought Content: Thought content normal.      UC Treatments / Results  Labs (all labs ordered are listed, but only abnormal results are displayed) Labs Reviewed - No data to display  EKG   Radiology No results found.  Procedures Procedures (including critical care time)  Medications Ordered in UC Medications - No data to display  Initial Impression / Assessment and Plan / UC Course  I have reviewed the triage vital signs and the nursing  notes.  Pertinent labs & imaging results that were available during my care of the patient were reviewed by me and considered in my medical decision making (see chart for details).     Diarrhea Nausea Fatigue Weakness Gastroenteritis  Discussed with patient that given the length and severity of her symptoms, she would be best served in the emergency room I suspect that she is dehydrated and likely needs IV fluid replacement Also discussed that they could do abdominal imaging in the ER that is outside of the resources that we have available in the urgent care Patient verbalized understanding and is in agreement with treatment plan Her son drove her today, he will drive her to the ER today Declines EMS today Able to walk out to the car at discharge  Final Clinical Impressions(s) / UC Diagnoses   Final diagnoses:  Diarrhea, unspecified type  Nausea  Other fatigue  Generalized muscle weakness  Gastroenteritis     Discharge Instructions     Go to the ER for further evaluation and treatment.  I suspect that you are dehydrated, and that you need some further imaging on your abdomen then we can provide in an outpatient setting.    ED Prescriptions    None     PDMP not reviewed this encounter.   Faustino Congress, NP 12/09/19 1254

## 2019-12-09 NOTE — ED Provider Notes (Signed)
[facility] DEPT Provider Note   CSN: [id number] Arrival date & time: 12/09/19  1354     History Chief Complaint  Patient presents with  . Headache  . Diarrhea    Susan Li is a 79 y.o. female.  HPI Patient presents with diarrhea myalgias and some likely dehydration.  Seen at urgent care and sent here.  Started sertraline around 2 weeks ago and developed diarrhea.  Has frequent diarrhea that is watery.  No blood.  Also decreased appetite.  No nausea or vomiting.  States started on Friday with today being Saturday she is decreasing the sertraline down to one half dose.  No fevers or chills.  Mild abdominal crampiness at times.  No real abdominal pain.  Has not had her Covid vaccine.    Past Medical History:  Diagnosis Date  . Arthritis    hips,backs,hands; osteoarthritis  . Asthma    Symbicort as needed 3x week  . Bursitis   . Fibromyalgia   . GERD (gastroesophageal reflux disease)   . Insomnia   . Sciatica of right side   . Vitamin D deficiency     Patient Active Problem List   Diagnosis Date Noted  . Oral candida 11/18/2018  . Cervical radiculopathy 06/15/2018  . Moderate persistent asthma with acute exacerbation 01/21/2017  . Cough 01/21/2017  . Other allergic rhinitis 12/30/2016  . LPRD (laryngopharyngeal reflux disease) 12/30/2016  . Acute sinusitis 12/30/2016  . Asthma, well controlled 11/22/2014  . Allergic rhinitis due to pollen 11/22/2014    Past Surgical History:  Procedure Laterality Date  . ABDOMINAL HYSTERECTOMY  2000  . Buninonectomy  2008  . [place] SURGERY  2004  . COLONOSCOPY WITH PROPOFOL N/A 04/25/2012   Procedure: COLONOSCOPY WITH PROPOFOL;  Surgeon: Garlan Fair, MD;  Location: [facility];  Service: Endoscopy;  Laterality: N/A;  . LASIK  2004  . left shoulder surgery    . RESECTION DISTAL CLAVICAL Left 08/24/2014   Procedure: RESECTION DISTAL CLAVICAL;  Surgeon: Melrose Nakayama, MD;  Location:  [facility];  Service: Orthopedics;  Laterality: Left;  . SHOULDER ARTHROSCOPY WITH SUBACROMIAL DECOMPRESSION Left 08/24/2014   Procedure: ARTHROSCOPY  LEFT SHOULDER, SUBACROMIAL DECOMPRESSION, DISTAL CLAVICLE RESECTION AND DEBRIDEMENT;  Surgeon: Melrose Nakayama, MD;  Location: [facility];  Service: Orthopedics;  Laterality: Left;  . Tendernitis Surgery  99     OB History   No obstetric history on file.     Family History  Problem Relation Age of Onset  . Healthy Mother   . Healthy Father   . Neuromuscular disorder Neg Hx     Social History   Tobacco Use  . Smoking status: Never Smoker  . Smokeless tobacco: Never Used  Vaping Use  . Vaping Use: Never used  Substance Use Topics  . Alcohol use: Yes    Alcohol/week: 14.0 standard drinks    Types: 14 Glasses of wine per week  . Drug use: No    Home Medications Prior to Admission medications   Medication Sig Start Date End Date Taking? Authorizing Provider  albuterol (VENTOLIN HFA) 108 (90 Base) MCG/ACT inhaler Inhale 2 puffs into the lungs every 4 (four) hours as needed for wheezing or shortness of breath. 07/12/19  Yes Kozlow, Donnamarie Poag, MD  Ascorbic Acid (VITAMIN C WITH ROSE HIPS) 500 MG tablet Take 500 mg by mouth daily.   Yes [provider]  BIOTIN PO Take 5,000 mg by mouth daily.  Yes [provider]  budesonide-formoterol (SYMBICORT) 160-4.5 MCG/ACT inhaler USE 2 PUFFS TWICE DAILY. Patient taking differently: Inhale 2 puffs into the lungs 2 (two) times daily. USE 2 PUFFS TWICE DAILY. 07/12/19  Yes Kozlow, Donnamarie Poag, MD  Calcium Citrate-Vitamin D (CALCIUM + D PO) Take 1,200 mg by mouth daily.   Yes [provider]  cetirizine (ZYRTEC) 5 MG tablet Take 5 mg by mouth daily.   Yes [provider]  clonazePAM (KLONOPIN) 0.5 MG tablet Take 0.5 mg by mouth at bedtime.  05/23/19  Yes [provider]  Dextrose-Fructose-Sod Citrate (NAUZENE PO) Take 1 tablet by mouth  daily as needed (nausea).   Yes [provider]  estradiol (ESTRACE) 1 MG tablet Take 0.5 mg by mouth daily.    Yes [provider]  famotidine (PEPCID) 20 MG tablet Take one tablet by mouth daily at bedtime Patient taking differently: Take 20 mg by mouth daily as needed. Take one tablet by mouth daily at bedtime 07/12/19  Yes Kozlow, Donnamarie Poag, MD  omeprazole (PRILOSEC) 20 MG capsule Take 1 capsule (20 mg total) by mouth daily. 07/12/19  Yes Kozlow, Donnamarie Poag, MD  sertraline (ZOLOFT) 50 MG tablet Take 25 mg by mouth daily. 25 mg for 7 days. On day 2 of day 7. 11/23/19  Yes [provider]  simethicone (MYLICON) 222 MG chewable tablet Chew 125 mg by mouth every 6 (six) hours as needed for flatulence.   Yes [provider]  zolpidem (AMBIEN) 10 MG tablet Take 5 mg by mouth at bedtime.    Yes [provider]  loperamide (IMODIUM) 2 MG capsule Take 1 capsule (2 mg total) by mouth 4 (four) times daily as needed for diarrhea or loose stools. 12/09/19   Davonna Belling, MD  neomycin-polymyxin b-dexamethasone (MAXITROL) 3.5-10000-0.1 OINT  05/15/19   [provider]  potassium chloride SA (KLOR-CON) 20 MEQ tablet Take 1 tablet (20 mEq total) by mouth 2 (two) times daily. 12/09/19   Davonna Belling, MD    Allergies    Morphine and related  Review of Systems   Review of Systems  Constitutional: Positive for appetite change and fatigue.  Respiratory: Negative for shortness of breath.   Cardiovascular: Negative for chest pain.  Gastrointestinal: Negative for abdominal pain.  Genitourinary: Negative for flank pain.  Musculoskeletal: Negative for back pain.  Skin: Negative for pallor.  Neurological: Negative for weakness.  Psychiatric/Behavioral: Negative for behavioral problems.    Physical Exam Updated Vital Signs BP (!) 146/96   Pulse 78   Temp 97.6 F (36.4 C) (Oral)   Resp 12   Ht 5\' 3"  (1.6 m)   Wt 68 kg   SpO2 98%   BMI 26.57 kg/m    Physical Exam Vitals and nursing note reviewed.  HENT:     Head: Atraumatic.  Eyes:     General: No scleral icterus. Cardiovascular:     Rate and Rhythm: Normal rate and regular rhythm.  Pulmonary:     Breath sounds: No wheezing, rhonchi or rales.  Abdominal:     Tenderness: There is no abdominal tenderness.  Musculoskeletal:     Cervical back: Neck supple.  Skin:    General: Skin is warm.     Capillary Refill: Capillary refill takes less than 2 seconds.  Neurological:     Mental Status: She is alert.  Psychiatric:        Mood and Affect: Mood normal.     ED Results / Procedures /  Treatments   Labs (all labs ordered are listed, but only abnormal results are displayed) Labs Reviewed  COMPREHENSIVE METABOLIC PANEL - Abnormal; Notable for the following components:      Result Value   Potassium 2.8 (*)    CO2 18 (*)    Calcium 8.8 (*)    All other components within normal limits  CBC WITH DIFFERENTIAL/PLATELET - Abnormal; Notable for the following components:   RBC 3.73 (*)    All other components within normal limits  URINALYSIS, ROUTINE W REFLEX MICROSCOPIC - Abnormal; Notable for the following components:   APPearance HAZY (*)    Ketones, ur 20 (*)    All other components within normal limits  RESP PANEL BY RT-PCR (FLU A&B, COVID) ARPGX2    EKG None  Radiology No results found.  Procedures Procedures (including critical care time)  Medications Ordered in ED Medications  lactated ringers bolus 1,000 mL (0 mLs Intravenous Stopped 12/09/19 1630)  potassium chloride 10 mEq in 100 mL IVPB (10 mEq Intravenous New Bag/Given 12/09/19 1633)    ED Course  I have reviewed the triage vital signs and the nursing notes.  Pertinent labs & imaging results that were available during my care of the patient were reviewed by me and considered in my medical decision making (see chart for details).    MDM Rules/Calculators/A&P                          Patient with  diarrhea.  Has had for last couple weeks after starting sertraline.  Potentially could be related to this.  Lab work does show a mild hypokalemia.  Supplement IV and will supplement oral.  Lab work reviewed.  Does show some dehydration.  Patient feels better and has been tolerating orals.  Will discharge home with outpatient follow-up.  Also take Imodium as needed.  Hopefully decreasing the sertraline will help with the diarrhea if not may need further work-up by PCP.  Benign abdominal exam do not think she needs imaging at this time. Final Clinical Impression(s) / ED Diagnoses Final diagnoses:  Diarrhea, unspecified type  Hypokalemia    Rx / DC Orders ED Discharge Orders         Ordered    potassium chloride SA (KLOR-CON) 20 MEQ tablet  2 times daily        12/09/19 1757    loperamide (IMODIUM) 2 MG capsule  4 times daily PRN        12/09/19 1757           Davonna Belling, MD 12/09/19 1758

## 2019-12-09 NOTE — ED Triage Notes (Addendum)
Patient reports she has had diarrhea and headache for two weeks. Feeling weak also. Says her pcp started her on setraline and that is when diarrhea started. Denies pain. Patient went to UC today and was directed to ED.

## 2019-12-09 NOTE — ED Notes (Signed)
Patient says her son brought her.  Amber, patient access has called number in medical record and he denies having brought her here.  This person is calling other family members to see is anyone brought patient here today

## 2019-12-09 NOTE — Discharge Instructions (Signed)
Go to the ER for further evaluation and treatment.  I suspect that you are dehydrated, and that you need some further imaging on your abdomen then we can provide in an outpatient setting.

## 2019-12-12 DIAGNOSIS — R197 Diarrhea, unspecified: Secondary | ICD-10-CM | POA: Diagnosis not present

## 2019-12-13 DIAGNOSIS — M47816 Spondylosis without myelopathy or radiculopathy, lumbar region: Secondary | ICD-10-CM | POA: Diagnosis not present

## 2019-12-13 DIAGNOSIS — R197 Diarrhea, unspecified: Secondary | ICD-10-CM | POA: Diagnosis not present

## 2019-12-20 DIAGNOSIS — R197 Diarrhea, unspecified: Secondary | ICD-10-CM | POA: Diagnosis not present

## 2019-12-20 DIAGNOSIS — F4321 Adjustment disorder with depressed mood: Secondary | ICD-10-CM | POA: Diagnosis not present

## 2019-12-20 DIAGNOSIS — G4700 Insomnia, unspecified: Secondary | ICD-10-CM | POA: Diagnosis not present

## 2019-12-20 DIAGNOSIS — F419 Anxiety disorder, unspecified: Secondary | ICD-10-CM | POA: Diagnosis not present

## 2019-12-25 DIAGNOSIS — M47816 Spondylosis without myelopathy or radiculopathy, lumbar region: Secondary | ICD-10-CM | POA: Diagnosis not present

## 2020-01-01 DIAGNOSIS — F419 Anxiety disorder, unspecified: Secondary | ICD-10-CM | POA: Diagnosis not present

## 2020-01-01 DIAGNOSIS — R197 Diarrhea, unspecified: Secondary | ICD-10-CM | POA: Diagnosis not present

## 2020-01-01 DIAGNOSIS — G4700 Insomnia, unspecified: Secondary | ICD-10-CM | POA: Diagnosis not present

## 2020-01-01 DIAGNOSIS — F4321 Adjustment disorder with depressed mood: Secondary | ICD-10-CM | POA: Diagnosis not present

## 2020-01-09 ENCOUNTER — Ambulatory Visit (INDEPENDENT_AMBULATORY_CARE_PROVIDER_SITE_OTHER): Payer: Medicare Other | Admitting: Allergy and Immunology

## 2020-01-09 ENCOUNTER — Other Ambulatory Visit: Payer: Self-pay

## 2020-01-09 VITALS — HR 105 | Temp 98.0°F | Wt 152.8 lb

## 2020-01-09 DIAGNOSIS — F4310 Post-traumatic stress disorder, unspecified: Secondary | ICD-10-CM | POA: Diagnosis not present

## 2020-01-09 DIAGNOSIS — K219 Gastro-esophageal reflux disease without esophagitis: Secondary | ICD-10-CM | POA: Diagnosis not present

## 2020-01-09 DIAGNOSIS — J3089 Other allergic rhinitis: Secondary | ICD-10-CM | POA: Diagnosis not present

## 2020-01-09 DIAGNOSIS — J4540 Moderate persistent asthma, uncomplicated: Secondary | ICD-10-CM | POA: Diagnosis not present

## 2020-01-09 NOTE — Patient Instructions (Addendum)
  1. Continue Symbicort 160 mcg  2 puffs 2 times a day   2. Continue omeprazole 20 mg in morning + famotidine 20 mg in evening   3. Continue Nasacort - 1 spray each nostril 1 time per day during upper airway symptoms      4. Continue OTC Zyrtec 1 time per day if needed  5. Continue ProAir HFA 2 puffs every 4-6 hours if needed  6. Return to clinic in 6 months or earlier if problem

## 2020-01-09 NOTE — Progress Notes (Signed)
Susan Li   Follow-up Note  Referring Provider: Josetta Huddle, MD Primary Provider: Josetta Huddle, MD Date of Office Visit: 01/09/2020  Subjective:   Susan Li (DOB: 27-Dec-1940) is a 78 y.o. female who returns to the [facility] on 01/09/2020 in re-evaluation of the following:  HPI: Susan Li returns to this clinic in reevaluation of asthma and allergic rhinitis and reflux and a history of scolding mouth syndrome.  Her last visit to this clinic was 11 July 2019.  She continues to do well regarding her airway and has not required a systemic steroid or an antibiotic for any type of airway issue and rarely uses a short acting bronchodilator where she continues on a collection of anti-inflammatory agents for both her upper and lower airway.  She has had no problems with her reflux at this point in time.  She has not had return of her scolding mouth syndrome ever since she made some modifications regarding toothpaste and wine consumption.  She has been having some serious issues with the loss of her husband which was several years ago.  Her initial event fit the definition of posttraumatic stress disorder and she is having a difficult time with his loss and she asked me for information about counselors.  She has received 3 Pfizer Covid vaccinations and the flu vaccine.  Allergies as of 01/09/2020      Reactions   Morphine And Related Itching   Itching nose      Medication List      albuterol 108 (90 Base) MCG/ACT inhaler Commonly known as: Ventolin HFA Inhale 2 puffs into the lungs every 4 (four) hours as needed for wheezing or shortness of breath.   BIOTIN PO Take 5,000 mg by mouth daily.   budesonide-formoterol 160-4.5 MCG/ACT inhaler Commonly known as: SYMBICORT USE 2 PUFFS TWICE DAILY.   CALCIUM + D PO Take 1,200 mg by mouth daily.   cetirizine 5 MG tablet Commonly known as: ZYRTEC Take 5 mg by mouth  daily.   clonazePAM 0.5 MG tablet Commonly known as: KLONOPIN Take 0.5 mg by mouth at bedtime.   estradiol 1 MG tablet Commonly known as: ESTRACE Take 0.5 mg by mouth daily.   famotidine 20 MG tablet Commonly known as: Pepcid Take one tablet by mouth daily at bedtime   loperamide 2 MG capsule Commonly known as: IMODIUM Take 1 capsule (2 mg total) by mouth 4 (four) times daily as needed for diarrhea or loose stools.   NAUZENE PO Take 1 tablet by mouth daily as needed (nausea).   neomycin-polymyxin b-dexamethasone 3.5-10000-0.1 Oint Commonly known as: MAXITROL   omeprazole 20 MG capsule Commonly known as: PRILOSEC Take 1 capsule (20 mg total) by mouth daily.   potassium chloride SA 20 MEQ tablet Commonly known as: KLOR-CON Take 1 tablet (20 mEq total) by mouth 2 (two) times daily.   sertraline 50 MG tablet Commonly known as: ZOLOFT Take 25 mg by mouth daily. 25 mg for 7 days. On day 2 of day 7.   simethicone 125 MG chewable tablet Commonly known as: MYLICON Chew 0000000 mg by mouth every 6 (six) hours as needed for flatulence.   vitamin C with rose hips 500 MG tablet Take 500 mg by mouth daily.   zolpidem 10 MG tablet Commonly known as: AMBIEN Take 5 mg by mouth at bedtime.       Past Medical History:  Diagnosis Date  . Arthritis  hips,backs,hands; osteoarthritis  . Asthma    Symbicort as needed 3x week  . Bursitis   . Fibromyalgia   . GERD (gastroesophageal reflux disease)   . Insomnia   . Sciatica of right side   . Vitamin D deficiency     Past Surgical History:  Procedure Laterality Date  . ABDOMINAL HYSTERECTOMY  2000  . Buninonectomy  2008  . CERVICAL DISC SURGERY  2004  . COLONOSCOPY WITH PROPOFOL N/A 04/25/2012   Procedure: COLONOSCOPY WITH PROPOFOL;  Surgeon: Charolett Bumpers, MD;  Location: [facility];  Service: Endoscopy;  Laterality: N/A;  . LASIK  2004  . left shoulder surgery    . RESECTION DISTAL CLAVICAL Left 08/24/2014   Procedure:  RESECTION DISTAL CLAVICAL;  Surgeon: Marcene Corning, MD;  Location: [facility];  Service: Orthopedics;  Laterality: Left;  . SHOULDER ARTHROSCOPY WITH SUBACROMIAL DECOMPRESSION Left 08/24/2014   Procedure: ARTHROSCOPY  LEFT SHOULDER, SUBACROMIAL DECOMPRESSION, DISTAL CLAVICLE RESECTION AND DEBRIDEMENT;  Surgeon: Marcene Corning, MD;  Location: [facility];  Service: Orthopedics;  Laterality: Left;  . Tendernitis Surgery  99    Review of systems negative except as noted in HPI / PMHx or noted below:  Review of Systems  Constitutional: Negative.   HENT: Negative.   Eyes: Negative.   Respiratory: Negative.   Cardiovascular: Negative.   Gastrointestinal: Negative.   Genitourinary: Negative.   Musculoskeletal: Negative.   Skin: Negative.   Neurological: Negative.   Endo/Heme/Allergies: Negative.   Psychiatric/Behavioral: Negative.      Objective:   Vitals:   01/10/20 1446  Pulse: (!) 105  Temp: 98 F (36.7 C)  SpO2: 96%      Weight: 152 lb 12.8 oz (69.3 kg)   Physical Exam Constitutional:      Appearance: She is not diaphoretic.  HENT:     Head: Normocephalic.     Right Ear: Tympanic membrane, ear canal and external ear normal.     Left Ear: Tympanic membrane, ear canal and external ear normal.     Nose: Nose normal. No mucosal edema or rhinorrhea.     Mouth/Throat:     Mouth: Oropharynx is clear and moist and mucous membranes are normal.     Pharynx: Uvula midline. No oropharyngeal exudate.  Eyes:     Conjunctiva/sclera: Conjunctivae normal.  Neck:     Thyroid: No thyromegaly.     Trachea: Trachea normal. No tracheal tenderness or tracheal deviation.  Cardiovascular:     Rate and Rhythm: Normal rate and regular rhythm.     Heart sounds: Normal heart sounds, S1 normal and S2 normal. No murmur heard.   Pulmonary:     Effort: No respiratory distress.     Breath sounds: Normal breath sounds. No stridor. No wheezing or rales.   Musculoskeletal:        General: No edema.  Lymphadenopathy:     Head:     Right side of head: No tonsillar adenopathy.     Left side of head: No tonsillar adenopathy.     Cervical: No cervical adenopathy.  Skin:    Findings: No erythema or rash.     Nails: There is no clubbing.  Neurological:     Mental Status: She is alert.     Diagnostics:    Spirometry was performed and demonstrated an FEV1 of 1.70 at 89 % of predicted.  Assessment and Plan:   1. Asthma, moderate persistent, well-controlled   2. Other allergic rhinitis   3.  LPRD (laryngopharyngeal reflux disease)   4. PTSD (post-traumatic stress disorder)     1. Continue Symbicort 160 mcg  2 puffs 2 times a day   2. Continue omeprazole 20 mg in morning + famotidine 20 mg in evening   3. Continue Nasacort - 1 spray each nostril 1 time per day during upper airway symptoms      4. Continue OTC Zyrtec 1 time per day if needed  5. Continue ProAir HFA 2 puffs every 4-6 hours if needed  6. Return to clinic in 6 months or earlier if problem  From a respiratory standpoint and from a reflux standpoint Carnesha is doing very well and I see no need for changing her medical therapy at this point.  I did give her the names of several counselors that may be helpful for her to visit regarding her PTSD and prolonged grief that occurred as a result of her husband's death.  If she does well with the plan noted above I will see her back in this clinic in 6 months or earlier if there is a problem.  Allena Katz, MD Allergy / Immunology [facility]

## 2020-01-15 ENCOUNTER — Encounter: Payer: Self-pay | Admitting: Allergy and Immunology

## 2020-01-23 DIAGNOSIS — F419 Anxiety disorder, unspecified: Secondary | ICD-10-CM | POA: Diagnosis not present

## 2020-01-23 DIAGNOSIS — R197 Diarrhea, unspecified: Secondary | ICD-10-CM | POA: Diagnosis not present

## 2020-01-23 DIAGNOSIS — F4321 Adjustment disorder with depressed mood: Secondary | ICD-10-CM | POA: Diagnosis not present

## 2020-01-23 DIAGNOSIS — Z0001 Encounter for general adult medical examination with abnormal findings: Secondary | ICD-10-CM | POA: Diagnosis not present

## 2020-01-23 DIAGNOSIS — Z79899 Other long term (current) drug therapy: Secondary | ICD-10-CM | POA: Diagnosis not present

## 2020-01-23 DIAGNOSIS — R1030 Lower abdominal pain, unspecified: Secondary | ICD-10-CM | POA: Diagnosis not present

## 2020-01-23 DIAGNOSIS — M545 Low back pain, unspecified: Secondary | ICD-10-CM | POA: Diagnosis not present

## 2020-01-23 DIAGNOSIS — G4700 Insomnia, unspecified: Secondary | ICD-10-CM | POA: Diagnosis not present

## 2020-01-24 DIAGNOSIS — F331 Major depressive disorder, recurrent, moderate: Secondary | ICD-10-CM | POA: Diagnosis not present

## 2020-02-06 DIAGNOSIS — F331 Major depressive disorder, recurrent, moderate: Secondary | ICD-10-CM | POA: Diagnosis not present

## 2020-02-14 DIAGNOSIS — M47816 Spondylosis without myelopathy or radiculopathy, lumbar region: Secondary | ICD-10-CM | POA: Diagnosis not present

## 2020-02-15 DIAGNOSIS — Z1231 Encounter for screening mammogram for malignant neoplasm of breast: Secondary | ICD-10-CM | POA: Diagnosis not present

## 2020-02-20 DIAGNOSIS — R030 Elevated blood-pressure reading, without diagnosis of hypertension: Secondary | ICD-10-CM | POA: Diagnosis not present

## 2020-02-20 DIAGNOSIS — F4321 Adjustment disorder with depressed mood: Secondary | ICD-10-CM | POA: Diagnosis not present

## 2020-03-04 ENCOUNTER — Telehealth: Payer: Self-pay | Admitting: Allergy and Immunology

## 2020-03-04 NOTE — Telephone Encounter (Signed)
Working on Microbiologist

## 2020-03-04 NOTE — Telephone Encounter (Signed)
Patient called and states that Symbicort was not refilled because it needs a prior authorization. Patient states this happened last year and it got approved. Patient states [employer] is needing an authorization or an alternative  Please advise.

## 2020-03-04 NOTE — Telephone Encounter (Signed)
Pa submitted

## 2020-03-04 NOTE — Telephone Encounter (Signed)
Lm for pt to call us back pa came back as not needed. So pt has symbicort covered as pa is not needed.

## 2020-03-12 MED ORDER — BUDESONIDE-FORMOTEROL FUMARATE 160-4.5 MCG/ACT IN AERO
INHALATION_SPRAY | RESPIRATORY_TRACT | 1 refills | Status: DC
Start: 2020-03-12 — End: 2020-07-09

## 2020-03-12 NOTE — Addendum Note (Signed)
Addended by: Valere Dross on: 03/12/2020 03:45 PM   Modules accepted: Orders

## 2020-03-15 DIAGNOSIS — M47816 Spondylosis without myelopathy or radiculopathy, lumbar region: Secondary | ICD-10-CM | POA: Diagnosis not present

## 2020-03-15 DIAGNOSIS — M533 Sacrococcygeal disorders, not elsewhere classified: Secondary | ICD-10-CM | POA: Diagnosis not present

## 2020-03-18 NOTE — Telephone Encounter (Signed)
Called and left a message for patient to call our office back to see if we could get her medication changed or if she'd be ok to see a NP to discuss a medication change.

## 2020-03-18 NOTE — Telephone Encounter (Signed)
Patient called requesting an appointment with Dr. Neldon Mc tomorrow to discuss an alternative to Symbicort since her insurance will not cover it right now, but no appointments are available. Symbicort will be over $300 and she wants an alternative that will not be so expensive.  Please advise.

## 2020-03-20 ENCOUNTER — Ambulatory Visit (INDEPENDENT_AMBULATORY_CARE_PROVIDER_SITE_OTHER): Payer: Medicare Other | Admitting: Family Medicine

## 2020-03-20 ENCOUNTER — Encounter: Payer: Self-pay | Admitting: Family Medicine

## 2020-03-20 ENCOUNTER — Other Ambulatory Visit: Payer: Self-pay

## 2020-03-20 ENCOUNTER — Telehealth: Payer: Self-pay

## 2020-03-20 VITALS — BP 130/90 | HR 78 | Temp 97.8°F | Resp 18 | Ht 63.0 in | Wt 154.2 lb

## 2020-03-20 DIAGNOSIS — J4540 Moderate persistent asthma, uncomplicated: Secondary | ICD-10-CM | POA: Diagnosis not present

## 2020-03-20 DIAGNOSIS — K219 Gastro-esophageal reflux disease without esophagitis: Secondary | ICD-10-CM | POA: Diagnosis not present

## 2020-03-20 DIAGNOSIS — J3089 Other allergic rhinitis: Secondary | ICD-10-CM | POA: Diagnosis not present

## 2020-03-20 DIAGNOSIS — F4310 Post-traumatic stress disorder, unspecified: Secondary | ICD-10-CM | POA: Diagnosis not present

## 2020-03-20 MED ORDER — AIRDUO DIGIHALER 113-14 MCG/ACT IN AEPB: 1.0000 | INHALATION_SPRAY | Freq: Two times a day (BID) | RESPIRATORY_TRACT | 5 refills | Status: DC

## 2020-03-20 NOTE — Telephone Encounter (Signed)
Can you please have the pharmacy run this with the copay card only? Thank you

## 2020-03-20 NOTE — Telephone Encounter (Signed)
Spoke with pharmacist at [facility] and she stated that since the patient had part D that she couldn't use the coupon card. Even with the store discount it was still $521 and some change. The Symbicort she was on was 387.69

## 2020-03-20 NOTE — Telephone Encounter (Signed)
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 Alternative medications are Advair HFA, Trelegy Ellipta, Flovent HFA, Breo Ellipta, Anoro, and Serevent. Please advise if you want [place] to move forward with PA or use alternative drugs.

## 2020-03-20 NOTE — Telephone Encounter (Signed)
Pt was seen in clinic today with anne ambs fnp

## 2020-03-20 NOTE — Patient Instructions (Signed)
Asthma Stop Symbicort and begin AirDuo 113-1 puff twice a day to prevent cough or wheeze (sample given at the clinic). Continue albuterol 2 puffs once every 4 hours as needed for cough or wheeze You may use albuterol 2 puffs 5 to 15 minutes before activity to decrease cough or wheeze  Allergic rhinitis Continue allergen avoidance measures directed toward mold, cat, and dog as listed below Continue cetirizine once once a day as needed for runny nose or itch Continue Nasacort 1 spray in each nostril once a day as needed for stuffy nose Consider saline nasal rinses as needed for nasal symptoms. Use this before any medicated nasal sprays for best result  Reflux Continue dietary modifications as listed below Continue omeprazole 20 mg once a day and famotidine 20 mg once a day  Call the clinic if this treatment plan is not working well for you  Follow up in 2 months or sooner if needed.   Lifestyle Changes for Controlling GERD When you have GERD, stomach acid feels as if it's backing up toward your mouth. Whether or not you take medication to control your GERD, your symptoms can often be improved with lifestyle changes.   Raise Your Head  Reflux is more likely to strike when you're lying down flat, because stomach fluid can  flow backward more easily. Raising the head of your bed 4-6 inches can help. To do this:  Slide blocks or books under the legs at the head of your bed. Or, place a wedge under  the mattress. Many foam stores can make a suitable wedge for you. The wedge  should run from your waist to the top of your head.  Don't just prop your head on several pillows. This increases pressure on your  stomach. It can make GERD worse.  Watch Your Eating Habits Certain foods may increase the acid in your stomach or relax the lower esophageal sphincter, making GERD more likely. It's best to avoid the following:  Coffee, tea, and carbonated drinks (with and without  caffeine)  Fatty, fried, or spicy food  Mint, chocolate, onions, and tomatoes  Any other foods that seem to irritate your stomach or cause you pain  Relieve the Pressure  Eat smaller meals, even if you have to eat more often.  Don't lie down right after you eat. Wait a few hours for your stomach to empty.  Avoid tight belts and tight-fitting clothes.  Lose excess weight.  Tobacco and Alcohol  Avoid smoking tobacco and drinking alcohol. They can make GERD symptoms worse.  Control of Mold Allergen Mold and fungi can grow on a variety of surfaces provided certain temperature and moisture conditions exist.  Outdoor molds grow on plants, decaying vegetation and soil.  The major outdoor mold, Alternaria and Cladosporium, are found in very high numbers during hot and dry conditions.  Generally, a late Summer - Fall peak is seen for common outdoor fungal spores.  Rain will temporarily lower outdoor mold spore count, but counts rise rapidly when the rainy period ends.  The most important indoor molds are Aspergillus and Penicillium.  Dark, humid and poorly ventilated basements are ideal sites for mold growth.  The next most common sites of mold growth are the bathroom and the kitchen.  Outdoor [employer] 1. Use air conditioning and keep windows closed 2. Avoid exposure to decaying vegetation. 3. Avoid leaf raking. 4. Avoid grain handling. 5. Consider wearing a face mask if working in moldy areas.  Indoor Mold Control 1.  Maintain humidity below 50%. 2. Clean washable surfaces with 5% bleach solution. 3. Remove sources e.g. Contaminated carpets.  Control of Dog or Cat Allergen Avoidance is the best way to manage a dog or cat allergy. If you have a dog or cat and are allergic to dog or cats, consider removing the dog or cat from the home. If you have a dog or cat but don't want to find it a new home, or if your family wants a pet even though someone in the household is allergic, here are  some strategies that may help keep symptoms at bay:  6. Keep the pet out of your bedroom and restrict it to only a few rooms. Be advised that keeping the dog or cat in only one room will not limit the allergens to that room. 7. Don't pet, hug or kiss the dog or cat; if you do, wash your hands with soap and water. 8. High-efficiency particulate air (HEPA) cleaners run continuously in a bedroom or living room can reduce allergen levels over time. 9. Regular use of a high-efficiency vacuum cleaner or a central vacuum can reduce allergen levels. 10. Giving your dog or cat a bath at least once a week can reduce airborne allergen.

## 2020-03-20 NOTE — Telephone Encounter (Signed)
OK. Can we please get the duel inhalers that her insurance will cover? Thank you

## 2020-03-20 NOTE — Progress Notes (Signed)
[place]  [place] [address] Dept: [phone]  FOLLOW UP NOTE  Patient ID: Susan Li, female    DOB: Sep 29, 1940  Age: 80 y.o. MRN: [id number] Date of Office Visit: 03/20/2020  Assessment  Chief Complaint: Asthma (Has not been able to get Symbicort not covered by insurance - has used neb machine for wheezing ) and Cough (Has not had any issues with coughing at this time )  HPI Susan Li is a 80 year old female who presents to the clinic for a follow up visit. She was last seen in this clinic on 01/09/2020 by Dr. Neldon Mc for evaluation of asthma, allergic rhinitis, reflux, and PTSD.  She presents to the clinic today for follow-up visit involving the need to change her asthma maintenance inhaler as Symbicort is not currently covered by her insurance plan.  She will reports her asthma has been moderately well controlled with shortness of breath episodes occurring several times a week in the daytime and nighttime.  She has been out of Symbicort for about a month and has been using albuterol 3-4 times a day with moderate relief of symptoms.  Allergic rhinitis is reported as moderately well controlled with occasional nasal congestion for which she continues cetirizine daily and uses Nasacort as needed.  She is not currently using a nasal saline rinse.  Reflux is reported as well controlled with omeprazole 20 mg once a day and famotidine as needed.  Her current medications are listed in the chart.   Drug Allergies:  Allergies  Allergen Reactions  . Morphine And Related Itching    Itching nose  . Morphine Sulfate Other (See Comments)  . Sertraline Hcl Other (See Comments)    Physical Exam: BP 130/90   Pulse 78   Temp 97.8 F (36.6 C)   Resp 18   Ht 5\' 3"  (1.6 m)   Wt 154 lb 3.2 oz (69.9 kg)   SpO2 96%   BMI 27.32 kg/m    Physical Exam Vitals reviewed.  Constitutional:      Appearance: Normal appearance.  HENT:     Head: Normocephalic and atraumatic.      Right Ear: Tympanic membrane normal.     Left Ear: Tympanic membrane normal.     Nose:     Comments: Bilateral nares slightly erythematous with clear nasal drainage noted.  Pharynx normal.  Ears normal.  Eyes normal.    Mouth/Throat:     Pharynx: Oropharynx is clear.  Eyes:     Conjunctiva/sclera: Conjunctivae normal.  Cardiovascular:     Rate and Rhythm: Normal rate and regular rhythm.     Heart sounds: Murmur heard.    Pulmonary:     Effort: Pulmonary effort is normal.     Breath sounds: Normal breath sounds.     Comments: Bilateral scattered expiratory wheeze which cleared post bronchodilator therapy Musculoskeletal:        General: Normal range of motion.     Cervical back: Normal range of motion and neck supple.  Skin:    General: Skin is warm and dry.  Neurological:     Mental Status: She is alert and oriented to person, place, and time.  Psychiatric:        Mood and Affect: Mood normal.        Behavior: Behavior normal.        Thought Content: Thought content normal.        Judgment: Judgment normal.     Diagnostics: FVC 1.86,  FEV1 1.26.  Predicted FVC 2.57, predicted FEV1 1.91.  Spirometry indicates mild restriction mild airway obstruction.  "infec1.99,fav11.41. postbronchodilator spirometry indicates mild restriction with 12% improvement in FEV1 and 7% improvement in FVC.  Assessment and Plan: 1. Moderate persistent asthma, unspecified whether complicated   2. Other allergic rhinitis   3. LPRD (laryngopharyngeal reflux disease)   4. PTSD (post-traumatic stress disorder)     Meds ordered this encounter  Medications  . Fluticasone-Salmeterol,sensor, (AIRDUO DIGIHALER) 113-14 MCG/ACT AEPB    Sig: Inhale 1 puff into the lungs 2 (two) times daily.    Dispense:  1 each    Refill:  5    Patient Instructions  Asthma Stop Symbicort and begin AirDuo 113-1 puff twice a day to prevent cough or wheeze (sample given at the clinic). Continue albuterol 2 puffs once every  4 hours as needed for cough or wheeze You may use albuterol 2 puffs 5 to 15 minutes before activity to decrease cough or wheeze  Allergic rhinitis Continue allergen avoidance measures directed toward mold, cat, and dog as listed below Continue cetirizine once once a day as needed for runny nose or itch Continue Nasacort 1 spray in each nostril once a day as needed for stuffy nose Consider saline nasal rinses as needed for nasal symptoms. Use this before any medicated nasal sprays for best result  Reflux Continue dietary modifications as listed below Continue omeprazole 20 mg once a day and famotidine 20 mg once a day  Call the clinic if this treatment plan is not working well for you  Follow up in 2 months or sooner if needed.   Return in about 2 months (around 05/20/2020), or if symptoms worsen or fail to improve.    Thank you for the opportunity to care for this patient.  Please do not hesitate to contact me with questions.  Gareth Morgan, FNP All[facility]

## 2020-03-21 DIAGNOSIS — M461 Sacroiliitis, not elsewhere classified: Secondary | ICD-10-CM | POA: Diagnosis not present

## 2020-03-21 DIAGNOSIS — G4700 Insomnia, unspecified: Secondary | ICD-10-CM | POA: Diagnosis not present

## 2020-03-21 DIAGNOSIS — M545 Low back pain, unspecified: Secondary | ICD-10-CM | POA: Diagnosis not present

## 2020-03-21 DIAGNOSIS — R1030 Lower abdominal pain, unspecified: Secondary | ICD-10-CM | POA: Diagnosis not present

## 2020-03-21 DIAGNOSIS — R030 Elevated blood-pressure reading, without diagnosis of hypertension: Secondary | ICD-10-CM | POA: Diagnosis not present

## 2020-03-21 DIAGNOSIS — R197 Diarrhea, unspecified: Secondary | ICD-10-CM | POA: Diagnosis not present

## 2020-03-21 DIAGNOSIS — F4321 Adjustment disorder with depressed mood: Secondary | ICD-10-CM | POA: Diagnosis not present

## 2020-03-21 DIAGNOSIS — F419 Anxiety disorder, unspecified: Secondary | ICD-10-CM | POA: Diagnosis not present

## 2020-03-21 NOTE — Telephone Encounter (Signed)
[facility] called and states they need a nurse to give them a call regarding Tier exception on Symbicort. Please call at [phone] with reference number #[id number].

## 2020-03-21 NOTE — Telephone Encounter (Signed)
[facility] and spoke to [place] regarding Symbicort and Tier exception.  Per Horris Latino Symbicort remains $378.00 unless approved with appeals and physician review and approval on pharmacy end.  Informed Horris Latino that patient was seen in office and Symbicort has been stopped at this time and patient is now taking a sample of AirDuo with plan to continue AirDuo in place of Symbicort.  Tier exception for Symbicort  canceled.

## 2020-04-02 DIAGNOSIS — M533 Sacrococcygeal disorders, not elsewhere classified: Secondary | ICD-10-CM | POA: Diagnosis not present

## 2020-04-16 MED ORDER — ADVAIR HFA 230-21 MCG/ACT IN AERO: 2.0000 | INHALATION_SPRAY | Freq: Two times a day (BID) | RESPIRATORY_TRACT | 5 refills | Status: DC

## 2020-04-16 NOTE — Addendum Note (Signed)
Addended by: Mellody Memos on: 04/16/2020 05:34 PM   Modules accepted: Orders

## 2020-04-16 NOTE — Telephone Encounter (Signed)
Per Gareth Morgan, FNP we will send in Advair HFA 230 mcg. Left message letting patient know of change.

## 2020-04-22 DIAGNOSIS — M47816 Spondylosis without myelopathy or radiculopathy, lumbar region: Secondary | ICD-10-CM | POA: Diagnosis not present

## 2020-04-29 DIAGNOSIS — G4700 Insomnia, unspecified: Secondary | ICD-10-CM | POA: Diagnosis not present

## 2020-04-29 DIAGNOSIS — S0101XA Laceration without foreign body of scalp, initial encounter: Secondary | ICD-10-CM | POA: Diagnosis not present

## 2020-04-29 DIAGNOSIS — W19XXXA Unspecified fall, initial encounter: Secondary | ICD-10-CM | POA: Diagnosis not present

## 2020-05-07 ENCOUNTER — Other Ambulatory Visit: Payer: Self-pay | Admitting: Internal Medicine

## 2020-05-07 DIAGNOSIS — W19XXXA Unspecified fall, initial encounter: Secondary | ICD-10-CM | POA: Diagnosis not present

## 2020-05-07 DIAGNOSIS — S0990XA Unspecified injury of head, initial encounter: Secondary | ICD-10-CM | POA: Diagnosis not present

## 2020-05-07 DIAGNOSIS — R55 Syncope and collapse: Secondary | ICD-10-CM | POA: Diagnosis not present

## 2020-05-08 ENCOUNTER — Other Ambulatory Visit: Payer: Self-pay

## 2020-05-08 ENCOUNTER — Ambulatory Visit
Admission: RE | Admit: 2020-05-08 | Discharge: 2020-05-08 | Disposition: A | Discharge status: Home / Self Care | Payer: Medicare Other | Source: Ambulatory Visit | Attending: Internal Medicine | Admitting: Internal Medicine

## 2020-05-08 DIAGNOSIS — S0990XA Unspecified injury of head, initial encounter: Secondary | ICD-10-CM | POA: Diagnosis not present

## 2020-05-08 DIAGNOSIS — R55 Syncope and collapse: Secondary | ICD-10-CM | POA: Diagnosis not present

## 2020-05-08 DIAGNOSIS — W19XXXA Unspecified fall, initial encounter: Secondary | ICD-10-CM

## 2020-05-22 ENCOUNTER — Ambulatory Visit: Payer: 59 | Admitting: Family Medicine

## 2020-07-09 ENCOUNTER — Ambulatory Visit (INDEPENDENT_AMBULATORY_CARE_PROVIDER_SITE_OTHER): Payer: Medicare Other | Admitting: Allergy and Immunology

## 2020-07-09 ENCOUNTER — Other Ambulatory Visit: Payer: Self-pay

## 2020-07-09 VITALS — BP 128/80 | HR 109 | Temp 98.2°F | Resp 16 | Ht 63.0 in | Wt 157.2 lb

## 2020-07-09 DIAGNOSIS — J3089 Other allergic rhinitis: Secondary | ICD-10-CM

## 2020-07-09 DIAGNOSIS — F4310 Post-traumatic stress disorder, unspecified: Secondary | ICD-10-CM | POA: Diagnosis not present

## 2020-07-09 DIAGNOSIS — K219 Gastro-esophageal reflux disease without esophagitis: Secondary | ICD-10-CM | POA: Diagnosis not present

## 2020-07-09 DIAGNOSIS — J4540 Moderate persistent asthma, uncomplicated: Secondary | ICD-10-CM | POA: Diagnosis not present

## 2020-07-09 NOTE — Patient Instructions (Addendum)
  1. Continue Symbicort 160 mcg  2 puffs 2 times a day   2. Continue omeprazole 20 mg in morning + famotidine 20 mg in evening   3. Continue Nasacort - 1 spray each nostril 1 time per day during upper airway symptoms      4. Continue OTC Zyrtec 1 time per day if needed  5. Continue ProAir HFA 2 puffs every 4-6 hours if needed  6. Return to clinic in 6 months or earlier if problem  7. [facility] [employer], [place],  [address], [phone]

## 2020-07-09 NOTE — Progress Notes (Signed)
[place]   Follow-up Note  Referring Provider: Josetta Huddle, MD Primary Provider: Josetta Huddle, MD Date of Office Visit: 07/09/2020  Subjective:   Susan Li (DOB: 15-Oct-1940) is a 80 y.o. female who returns to the [facility] on 07/09/2020 in re-evaluation of the following:  HPI: Susan Li returns to this clinic in reevaluation of asthma and allergic rhinitis and reflux.  Her last visit to this clinic was 20 March 2020.  Overall she has done well with her airway.  Occasionally she will hear herself wheeze at nighttime especially if she does not use her Symbicort at night.  Otherwise, she is doing relatively well and can exert herself to the extent that she so desires and rarely uses a short acting bronchodilator.  She has not required a systemic steroid to treat an exacerbation of asthma but it should be noted that this spring she has received 2 steroid injections into her lower back.  She has had very little problems with her nose at this point in time.  Her reflux is under very good control at this point.  It has been 4 years since the death of her husband.  She is still grieving and freely admits that this event erodes into her day on a daily basis.  Her husband passed away from [place] and this was a very traumatic prolonged event with an obvious tragic ending.  She still has frequent bouts of crying and occasionally will purchase alcohol to blunt this dysphoria.  She has been treated with Cymbalta for the past 4 months which has not helped her at all.  She has spoken with a counselor at one point which may have helped her somewhat but there was a insurance issue that negated her following through with the counselor on a long-term basis.  Allergies as of 07/09/2020       Reactions   Morphine And Related Itching   Itching nose   Morphine Sulfate Other (See Comments)   Sertraline Hcl Other (See Comments)         Medication List     albuterol 108 (90 Base) MCG/ACT inhaler Commonly known as: Ventolin HFA Inhale 2 puffs into the lungs every 4 (four) hours as needed for wheezing or shortness of breath.   BIOTIN PO Take 5,000 mg by mouth daily.   budesonide-formoterol 160-4.5 MCG/ACT inhaler Commonly known as: SYMBICORT USE 2 PUFFS TWICE DAILY.   CALCIUM + D PO Take 1,200 mg by mouth daily.   clonazePAM 0.5 MG tablet Commonly known as: KLONOPIN Take 0.5 mg by mouth at bedtime.   DULoxetine 30 MG capsule Commonly known as: CYMBALTA Take 1 capsule by mouth daily.   estradiol 1 MG tablet Commonly known as: ESTRACE Take 0.5 mg by mouth daily.   famotidine 20 MG tablet Commonly known as: Pepcid Take one tablet by mouth daily at bedtime   loperamide 2 MG capsule Commonly known as: IMODIUM Take 1 capsule (2 mg total) by mouth 4 (four) times daily as needed for diarrhea or loose stools.   mometasone 50 MCG/ACT nasal spray Commonly known as: NASONEX 2 sprays in each nostril   NAUZENE PO Take 1 tablet by mouth daily as needed (nausea).   neomycin-polymyxin b-dexamethasone 3.5-10000-0.1 Oint Commonly known as: MAXITROL   omeprazole 20 MG capsule Commonly known as: PRILOSEC Take 1 capsule (20 mg total) by mouth daily.   potassium chloride SA 20 MEQ tablet Commonly known as:  KLOR-CON Take 1 tablet (20 mEq total) by mouth 2 (two) times daily.   simethicone 125 MG chewable tablet Commonly known as: MYLICON Chew 678 mg by mouth every 6 (six) hours as needed for flatulence.   vitamin C with rose hips 500 MG tablet Take 500 mg by mouth daily.   ZyrTEC Allergy 10 MG Caps Generic drug: Cetirizine HCl 1 tablet        Past Medical History:  Diagnosis Date   Arthritis    hips,backs,hands; osteoarthritis   Asthma    Symbicort as needed 3x week   Bursitis    Fibromyalgia    GERD (gastroesophageal reflux disease)    Insomnia    Sciatica of right side    Vitamin D  deficiency     Past Surgical History:  Procedure Laterality Date   ABDOMINAL HYSTERECTOMY  2000   Buninonectomy  2008   CERVICAL [place] SURGERY  2004   COLONOSCOPY WITH PROPOFOL N/A 04/25/2012   Procedure: COLONOSCOPY WITH PROPOFOL;  Surgeon: Garlan Fair, MD;  Location: [facility];  Service: Endoscopy;  Laterality: N/A;   LASIK  2004   left shoulder surgery     RESECTION DISTAL CLAVICAL Left 08/24/2014   Procedure: RESECTION DISTAL CLAVICAL;  Surgeon: Melrose Nakayama, MD;  Location: [place];  Service: Orthopedics;  Laterality: Left;   SHOULDER ARTHROSCOPY WITH SUBACROMIAL DECOMPRESSION Left 08/24/2014   Procedure: ARTHROSCOPY  LEFT SHOULDER, SUBACROMIAL DECOMPRESSION, DISTAL CLAVICLE RESECTION AND DEBRIDEMENT;  Surgeon: Melrose Nakayama, MD;  Location: [place];  Service: Orthopedics;  Laterality: Left;   Tendernitis Surgery  99    Review of systems negative except as noted in HPI / PMHx or noted below:  Review of Systems  Constitutional: Negative.   HENT: Negative.    Eyes: Negative.   Respiratory: Negative.    Cardiovascular: Negative.   Gastrointestinal: Negative.   Genitourinary: Negative.   Musculoskeletal: Negative.   Skin: Negative.   Neurological: Negative.   Endo/Heme/Allergies: Negative.   Psychiatric/Behavioral: Negative.      Objective:   Vitals:   07/09/20 1328  BP: 128/80  Pulse: (!) 109  Resp: 16  Temp: 98.2 F (36.8 C)  SpO2: 96%   Height: 5\' 3"  (160 cm)  Weight: 157 lb 3.2 oz (71.3 kg)   Physical Exam Constitutional:      Appearance: She is not diaphoretic.  HENT:     Head: Normocephalic. No right periorbital erythema or left periorbital erythema.     Right Ear: Tympanic membrane, ear canal and external ear normal.     Left Ear: Tympanic membrane, ear canal and external ear normal.     Nose: Nose normal. No mucosal edema or rhinorrhea.     Mouth/Throat:     Pharynx: No oropharyngeal exudate.  Eyes:      General: Lids are normal.     Conjunctiva/sclera: Conjunctivae normal.     Pupils: Pupils are equal, round, and reactive to light.  Neck:     Thyroid: No thyromegaly.     Trachea: Trachea normal. No tracheal deviation.  Cardiovascular:     Rate and Rhythm: Normal rate and regular rhythm.     Heart sounds: Normal heart sounds, S1 normal and S2 normal. No murmur heard. Pulmonary:     Effort: Pulmonary effort is normal. No respiratory distress.     Breath sounds: No stridor. Wheezing (Scattered end expiratory wheezing heard posterior lung field) present. No rales.  Chest:     Chest wall: No  tenderness.  Abdominal:     General: There is no distension.     Palpations: Abdomen is soft. There is no mass.     Tenderness: There is no abdominal tenderness. There is no guarding or rebound.  Musculoskeletal:        General: No tenderness.  Lymphadenopathy:     Head:     Right side of head: No tonsillar adenopathy.     Left side of head: No tonsillar adenopathy.     Cervical: No cervical adenopathy.  Skin:    Coloration: Skin is not pale.     Findings: No erythema or rash.     Nails: There is no clubbing.  Neurological:     Mental Status: She is alert.    Diagnostics:    Spirometry was performed and demonstrated an FEV1 of 1.58 at 83 % of predicted.   Assessment and Plan:   1. Asthma, moderate persistent, well-controlled   2. Other allergic rhinitis   3. LPRD (laryngopharyngeal reflux disease)   4. PTSD (post-traumatic stress disorder)      1. Continue Symbicort 160 mcg  2 puffs 2 times a day   2. Continue omeprazole 20 mg in morning + famotidine 20 mg in evening   3. Continue Nasacort - 1 spray each nostril 1 time per day during upper airway symptoms      4. Continue OTC Zyrtec 1 time per day if needed  5. Continue ProAir HFA 2 puffs every 4-6 hours if needed  6. Return to clinic in 6 months or earlier if problem  7. Susan Li [facility],  [place], [place] [id number], [phone]  Susan Li is doing relatively well from a respiratory standpoint and her reflux is under good control on her current plan and we are not really going to change any of this therapy at this point in time.  She needs help with what appears to be posttraumatic stress disorder and have given her the name of a counselor to contact who specializes in this issue.  I will see her back in this clinic in 6 months or earlier if there is a problem.    Allena Katz, MD Allergy / Immunology [place]

## 2020-07-10 ENCOUNTER — Encounter: Payer: Self-pay | Admitting: Allergy and Immunology

## 2020-07-10 ENCOUNTER — Telehealth: Payer: Self-pay | Admitting: *Deleted

## 2020-07-10 MED ORDER — ALBUTEROL SULFATE HFA 108 (90 BASE) MCG/ACT IN AERS: 2.0000 | INHALATION_SPRAY | RESPIRATORY_TRACT | 0 refills | Status: DC | PRN

## 2020-07-10 MED ORDER — OMEPRAZOLE 20 MG PO CPDR
20.0000 mg | DELAYED_RELEASE_CAPSULE | Freq: Every day | ORAL | 1 refills | Status: DC
Start: 2020-07-10 — End: 2020-11-12

## 2020-07-10 MED ORDER — FAMOTIDINE 20 MG PO TABS
20.0000 mg | ORAL_TABLET | Freq: Every day | ORAL | 5 refills | Status: DC | PRN
Start: 2020-07-10 — End: 2021-01-14

## 2020-07-10 MED ORDER — TRIAMCINOLONE ACETONIDE 55 MCG/ACT NA AERO: 2.0000 | INHALATION_SPRAY | Freq: Every day | NASAL | 5 refills | Status: DC

## 2020-07-10 MED ORDER — BUDESONIDE-FORMOTEROL FUMARATE 160-4.5 MCG/ACT IN AERO
INHALATION_SPRAY | RESPIRATORY_TRACT | 1 refills | Status: DC
Start: 2020-07-10 — End: 2020-11-12

## 2020-07-10 MED ORDER — ADVAIR HFA 230-21 MCG/ACT IN AERO
2.0000 | INHALATION_SPRAY | Freq: Two times a day (BID) | RESPIRATORY_TRACT | 5 refills | Status: DC
Start: 2020-07-10 — End: 2021-08-26

## 2020-07-10 NOTE — Telephone Encounter (Signed)
-----   Message from Jaynie Crumble, [place] sent at 07/10/2020 11:44 AM EDT ----- Called patient but had to leave a voicemail for patient to return the call ----- Message ----- From: Jiles Prows, MD Sent: 07/10/2020  10:43 AM EDT To: [facility]  Please ask Zeidy if she has made contact with Alyson Locket, therapist.

## 2020-07-16 ENCOUNTER — Telehealth: Payer: Self-pay | Admitting: Allergy and Immunology

## 2020-07-16 ENCOUNTER — Other Ambulatory Visit: Payer: Self-pay

## 2020-07-16 ENCOUNTER — Emergency Department (HOSPITAL_COMMUNITY): Payer: Medicare Other

## 2020-07-16 ENCOUNTER — Encounter (HOSPITAL_COMMUNITY): Payer: Self-pay

## 2020-07-16 ENCOUNTER — Emergency Department (HOSPITAL_COMMUNITY)
Admission: EM | Admit: 2020-07-16 | Discharge: 2020-07-16 | Disposition: A | Discharge status: Home / Self Care | Payer: Medicare Other | Attending: Emergency Medicine | Admitting: Emergency Medicine

## 2020-07-16 DIAGNOSIS — Z7951 Long term (current) use of inhaled steroids: Secondary | ICD-10-CM | POA: Diagnosis not present

## 2020-07-16 DIAGNOSIS — G4489 Other headache syndrome: Secondary | ICD-10-CM | POA: Diagnosis not present

## 2020-07-16 DIAGNOSIS — R1011 Right upper quadrant pain: Secondary | ICD-10-CM | POA: Insufficient documentation

## 2020-07-16 DIAGNOSIS — R52 Pain, unspecified: Secondary | ICD-10-CM | POA: Diagnosis not present

## 2020-07-16 DIAGNOSIS — K219 Gastro-esophageal reflux disease without esophagitis: Secondary | ICD-10-CM | POA: Insufficient documentation

## 2020-07-16 DIAGNOSIS — K7689 Other specified diseases of liver: Secondary | ICD-10-CM | POA: Diagnosis not present

## 2020-07-16 DIAGNOSIS — I10 Essential (primary) hypertension: Secondary | ICD-10-CM | POA: Diagnosis not present

## 2020-07-16 DIAGNOSIS — R110 Nausea: Secondary | ICD-10-CM | POA: Diagnosis not present

## 2020-07-16 DIAGNOSIS — J4541 Moderate persistent asthma with (acute) exacerbation: Secondary | ICD-10-CM | POA: Diagnosis not present

## 2020-07-16 DIAGNOSIS — R1010 Upper abdominal pain, unspecified: Secondary | ICD-10-CM | POA: Diagnosis present

## 2020-07-16 DIAGNOSIS — R1084 Generalized abdominal pain: Secondary | ICD-10-CM | POA: Diagnosis not present

## 2020-07-16 DIAGNOSIS — R1012 Left upper quadrant pain: Secondary | ICD-10-CM | POA: Diagnosis not present

## 2020-07-16 DIAGNOSIS — R197 Diarrhea, unspecified: Secondary | ICD-10-CM | POA: Diagnosis not present

## 2020-07-16 DIAGNOSIS — R1013 Epigastric pain: Secondary | ICD-10-CM | POA: Diagnosis not present

## 2020-07-16 LAB — TROPONIN I (HIGH SENSITIVITY)
Troponin I (High Sensitivity): <2 ng/L (ref <18)
Troponin I (High Sensitivity): <2 ng/L (ref <18)

## 2020-07-16 LAB — COMPREHENSIVE METABOLIC PANEL
ALT: 16 U/L (ref 0–44)
AST: 20 U/L (ref 15–41)
Albumin: 3.9 g/dL (ref 3.5–5.0)
Alkaline Phosphatase: 51 U/L (ref 38–126)
Anion gap: 10 (ref 5–15)
BUN: 12 mg/dL (ref 8–23)
CO2: 23 mmol/L (ref 22–32)
Calcium: 8.8 mg/dL — ABNORMAL LOW (ref 8.9–10.3)
Chloride: 105 mmol/L (ref 98–111)
Creatinine, Ser: 0.62 mg/dL (ref 0.44–1.00)
GFR, Estimated: >60 mL/min (ref >60)
Glucose, Bld: 117 mg/dL — ABNORMAL HIGH (ref 70–99)
Potassium: 3.8 mmol/L (ref 3.5–5.1)
Sodium: 138 mmol/L (ref 135–145)
Total Bilirubin: 0.9 mg/dL (ref 0.3–1.2)
Total Protein: 6.7 g/dL (ref 6.5–8.1)

## 2020-07-16 LAB — CBC WITH DIFFERENTIAL/PLATELET
Abs Immature Granulocytes: 0.04 10*3/uL (ref 0.00–0.07)
Basophils Absolute: 0 10*3/uL (ref 0.0–0.1)
Basophils Relative: 0 %
Eosinophils Absolute: 0.1 10*3/uL (ref 0.0–0.5)
Eosinophils Relative: 1 %
HCT: 41.1 % (ref 36.0–46.0)
Hemoglobin: 13.9 g/dL (ref 12.0–15.0)
Immature Granulocytes: 1 %
Lymphocytes Relative: 12 %
Lymphs Abs: 1 10*3/uL (ref 0.7–4.0)
MCH: 33 pg (ref 26.0–34.0)
MCHC: 33.8 g/dL (ref 30.0–36.0)
MCV: 97.6 fL (ref 80.0–100.0)
Monocytes Absolute: 0.6 10*3/uL (ref 0.1–1.0)
Monocytes Relative: 7 %
Neutro Abs: 6.3 10*3/uL (ref 1.7–7.7)
Neutrophils Relative %: 79 %
Platelets: 249 10*3/uL (ref 150–400)
RBC: 4.21 MIL/uL (ref 3.87–5.11)
RDW: 12.7 % (ref 11.5–15.5)
WBC: 8 10*3/uL (ref 4.0–10.5)
nRBC: 0 % (ref 0.0–0.2)

## 2020-07-16 LAB — URINALYSIS, ROUTINE W REFLEX MICROSCOPIC
Bilirubin Urine: NEGATIVE
Glucose, UA: NEGATIVE mg/dL
Hgb urine dipstick: NEGATIVE
Ketones, ur: 20 mg/dL — AB
Leukocytes,Ua: NEGATIVE
Nitrite: NEGATIVE
Protein, ur: NEGATIVE mg/dL
Specific Gravity, Urine: 1.01 (ref 1.005–1.030)
pH: 5 (ref 5.0–8.0)

## 2020-07-16 LAB — LIPASE, BLOOD: Lipase: 21 U/L (ref 11–51)

## 2020-07-16 MED ORDER — SUCRALFATE 1 GM/10ML PO SUSP: 1.0000 g | Freq: Three times a day (TID) | ORAL | 0 refills | Status: DC

## 2020-07-16 MED ORDER — ACETAMINOPHEN 325 MG PO TABS
650.0000 mg | ORAL_TABLET | Freq: Once | ORAL | Status: AC
  Administered 2020-07-16: 650 mg via ORAL
  Filled 2020-07-16: qty 2

## 2020-07-16 MED ORDER — FENTANYL CITRATE (PF) 100 MCG/2ML IJ SOLN
50.0000 ug | Freq: Once | INTRAMUSCULAR | Status: AC
  Administered 2020-07-16: 10:00:00 50 ug via INTRAVENOUS
  Filled 2020-07-16: qty 2

## 2020-07-16 MED ORDER — ONDANSETRON HCL 4 MG/2ML IJ SOLN
4.0000 mg | Freq: Once | INTRAMUSCULAR | Status: AC
  Administered 2020-07-16: 10:00:00 4 mg via INTRAVENOUS
  Filled 2020-07-16: qty 2

## 2020-07-16 MED ORDER — SODIUM CHLORIDE 0.9 % IV BOLUS
1000.0000 mL | Freq: Once | INTRAVENOUS | Status: AC
  Administered 2020-07-16: 10:00:00 1000 mL via INTRAVENOUS

## 2020-07-16 NOTE — ED Provider Notes (Signed)
[facility] DEPT Provider Note   CSN: [id number] Arrival date & time: 07/16/20  1448     History Chief Complaint  Patient presents with   Abdominal Pain    Susan Li is a 80 y.o. female.  HPI 80 year old female presents with acute upper abdominal pain.  It is a cramping sensation and has been present since she woke up around 7:30 AM.  It seems to come and go.  She had 1 loose bowel movement this morning but does not atypical for her.  She is had nausea without vomiting.  No chest pain, shortness of breath or back pain.  No urinary symptoms.  The pain is a 10 out of 10.  No similar prior history.  Past Medical History:  Diagnosis Date   Arthritis    hips,backs,hands; osteoarthritis   Asthma    Symbicort as needed 3x week   Bursitis    Fibromyalgia    GERD (gastroesophageal reflux disease)    Insomnia    Sciatica of right side    Vitamin D deficiency     Patient Active Problem List   Diagnosis Date Noted   Oral candida 11/18/2018   Cervical radiculopathy 06/15/2018   Moderate persistent asthma with acute exacerbation 01/21/2017   Cough 01/21/2017   Other allergic rhinitis 12/30/2016   LPRD (laryngopharyngeal reflux disease) 12/30/2016   Acute sinusitis 12/30/2016   Asthma, well controlled 11/22/2014   Allergic rhinitis due to pollen 11/22/2014    Past Surgical History:  Procedure Laterality Date   ABDOMINAL HYSTERECTOMY  2000   Buninonectomy  2008   CERVICAL [place] SURGERY  2004   COLONOSCOPY WITH PROPOFOL N/A 04/25/2012   Procedure: COLONOSCOPY WITH PROPOFOL;  Surgeon: Garlan Fair, MD;  [facility];  Service: Endoscopy;  Laterality: N/A;   LASIK  2004   left shoulder surgery     RESECTION DISTAL CLAVICAL Left 08/24/2014   Procedure: RESECTION DISTAL CLAVICAL;  Surgeon: Melrose Nakayama, MD;  Location: [facility];  Service: Orthopedics;  Laterality: Left;   SHOULDER ARTHROSCOPY WITH SUBACROMIAL  DECOMPRESSION Left 08/24/2014   Procedure: ARTHROSCOPY  LEFT SHOULDER, SUBACROMIAL DECOMPRESSION, DISTAL CLAVICLE RESECTION AND DEBRIDEMENT;  Surgeon: Melrose Nakayama, MD;  Location: [place] [facility];  Service: Orthopedics;  Laterality: Left;   Tendernitis Surgery  99     OB History   No obstetric history on file.     Family History  Problem Relation Age of Onset   Healthy Mother    Healthy Father    Neuromuscular disorder Neg Hx     Social History   Tobacco Use   Smoking status: Never   Smokeless tobacco: Never  Vaping Use   Vaping Use: Never used  Substance Use Topics   Alcohol use: Yes    Alcohol/week: 14.0 standard drinks    Types: 14 Glasses of wine per week   Drug use: No    Home Medications Prior to Admission medications   Medication Sig Start Date End Date Taking? Authorizing Provider  sucralfate (CARAFATE) 1 GM/10ML suspension Take 10 mLs (1 g total) by mouth 4 (four) times daily -  with meals and at bedtime. 07/16/20  Yes Sherwood Gambler, MD  albuterol (VENTOLIN HFA) 108 (90 Base) MCG/ACT inhaler Inhale 2 puffs into the lungs every 4 (four) hours as needed for wheezing or shortness of breath. 07/10/20   Kozlow, Donnamarie Poag, MD  Ascorbic Acid (VITAMIN C WITH ROSE HIPS) 500 MG tablet Take 500 mg by  mouth daily.    [provider]  BIOTIN PO Take 5,000 mg by mouth daily.     [provider]  budesonide-formoterol (SYMBICORT) 160-4.5 MCG/ACT inhaler USE 2 PUFFS TWICE DAILY. 07/10/20   Kozlow, Donnamarie Poag, MD  Calcium Citrate-Vitamin D (CALCIUM + D PO) Take 1,200 mg by mouth daily.    [provider]  Cetirizine HCl (ZYRTEC ALLERGY) 10 MG CAPS 1 tablet    [provider]  clonazePAM (KLONOPIN) 0.5 MG tablet Take 0.5 mg by mouth at bedtime.  05/23/19   [provider]  Dextrose-Fructose-Sod Citrate (NAUZENE PO) Take 1 tablet by mouth daily as needed (nausea).    [provider]  DULoxetine (CYMBALTA) 30 MG capsule Take 1  capsule by mouth daily.    [provider]  estradiol (ESTRACE) 1 MG tablet Take 0.5 mg by mouth daily.     [provider]  famotidine (PEPCID) 20 MG tablet Take 1 tablet (20 mg total) by mouth daily as needed. Take one tablet by mouth daily at bedtime 07/10/20   Kozlow, Donnamarie Poag, MD  fluticasone-salmeterol (ADVAIR HFA) 230-21 MCG/ACT inhaler Inhale 2 puffs into the lungs 2 (two) times daily. Rinse, gargle and spit out after use. 07/10/20   Kozlow, Donnamarie Poag, MD  loperamide (IMODIUM) 2 MG capsule Take 1 capsule (2 mg total) by mouth 4 (four) times daily as needed for diarrhea or loose stools. 12/09/19   Davonna Belling, MD  mometasone (NASONEX) 50 MCG/ACT nasal spray 2 sprays in each nostril 03/21/18   [provider]  neomycin-polymyxin b-dexamethasone (MAXITROL) 3.5-10000-0.1 OINT  05/15/19   [provider]  omeprazole (PRILOSEC) 20 MG capsule Take 1 capsule (20 mg total) by mouth daily. 07/10/20   Kozlow, Donnamarie Poag, MD  potassium chloride SA (KLOR-CON) 20 MEQ tablet Take 1 tablet (20 mEq total) by mouth 2 (two) times daily. 12/09/19   Davonna Belling, MD  simethicone (MYLICON) 756 MG chewable tablet Chew 125 mg by mouth every 6 (six) hours as needed for flatulence.    [provider]  triamcinolone (NASACORT) 55 MCG/ACT AERO nasal inhaler Place 2 sprays into the nose daily. 07/10/20   Kozlow, Donnamarie Poag, MD    Allergies    Morphine and related, Morphine sulfate, and Sertraline hcl  Review of Systems   Review of Systems  Constitutional:  Negative for fever.  Respiratory:  Negative for shortness of breath.   Cardiovascular:  Negative for chest pain.  Gastrointestinal:  Positive for abdominal pain, diarrhea and nausea. Negative for vomiting.  Genitourinary:  Negative for dysuria.  Musculoskeletal:  Negative for back pain.  All other systems reviewed and are negative.  Physical Exam Updated Vital Signs BP (!) 145/99   Pulse 94   Temp 97.8 F (36.6 C)  (Oral) Comment: Simultaneous filing. User may not have seen previous data.  Resp 18   SpO2 97%   Physical Exam Vitals and nursing note reviewed.  Constitutional:      Appearance: She is well-developed. She is not ill-appearing or diaphoretic.  HENT:     Head: Normocephalic and atraumatic.     Right Ear: External ear normal.     Left Ear: External ear normal.     Nose: Nose normal.  Eyes:     General:        Right eye: No discharge.        Left eye: No discharge.  Cardiovascular:     Rate and Rhythm: Normal rate and regular rhythm.  Heart sounds: Normal heart sounds.  Pulmonary:     Effort: Pulmonary effort is normal.     Breath sounds: Normal breath sounds.  Abdominal:     Palpations: Abdomen is soft.     Tenderness: There is abdominal tenderness in the right upper quadrant, epigastric area and left upper quadrant. Negative signs include Murphy's sign.  Skin:    General: Skin is warm and dry.  Neurological:     Mental Status: She is alert.  Psychiatric:        Mood and Affect: Mood is not anxious.    ED Results / Procedures / Treatments   Labs (all labs ordered are listed, but only abnormal results are displayed) Labs Reviewed  COMPREHENSIVE METABOLIC PANEL - Abnormal; Notable for the following components:      Result Value   Glucose, Bld 117 (*)    Calcium 8.8 (*)    All other components within normal limits  URINALYSIS, ROUTINE W REFLEX MICROSCOPIC - Abnormal; Notable for the following components:   Ketones, ur 20 (*)    All other components within normal limits  LIPASE, BLOOD  CBC WITH DIFFERENTIAL/PLATELET  TROPONIN I (HIGH SENSITIVITY)  TROPONIN I (HIGH SENSITIVITY)    EKG EKG Interpretation  Date/Time:  Tuesday July 16 2020 10:09:12 EDT Ventricular Rate:  88 PR Interval:  166 QRS Duration: 82 QT Interval:  384 QTC Calculation: 464 R Axis:   86 Text Interpretation: Normal sinus rhythm Low voltage QRS Borderline ECG similar to 2020 Confirmed by  Sherwood Gambler [phone]) on 07/16/2020 10:12:51 AM  Radiology US Abdomen Limited  Result Date: 07/16/2020 CLINICAL DATA:  Right upper quadrant pain EXAM: ULTRASOUND ABDOMEN LIMITED RIGHT UPPER QUADRANT COMPARISON:  None ultrasound March 08, 2014 FINDINGS: Gallbladder: No gallstones or wall thickening visualized. No sonographic Murphy sign noted by sonographer. Common bile duct: Diameter: 5 mm Liver: No focal lesion identified. Diffusely increased parenchymal echogenicity. Portal vein is patent on color Doppler imaging with normal direction of blood flow towards the liver. Other: None. IMPRESSION: The echogenicity of the liver is increased. This is a nonspecific finding but is most commonly seen with fatty infiltration of the liver. There are no obvious focal liver lesions. Electronically Signed   By: Dahlia Bailiff MD   On: 07/16/2020 10:59    Procedures Procedures   Medications Ordered in ED Medications  sodium chloride 0.9 % bolus 1,000 mL (0 mLs Intravenous Stopped 07/16/20 1206)  fentaNYL (SUBLIMAZE) injection 50 mcg (50 mcg Intravenous Given 07/16/20 1000)  ondansetron (ZOFRAN) injection 4 mg (4 mg Intravenous Given 07/16/20 1001)    ED Course  I have reviewed the triage vital signs and the nursing notes.  Pertinent labs & imaging results that were available during my care of the patient were reviewed by me and considered in my medical decision making (see chart for details).    MDM Rules/Calculators/A&P                          Patient presents with upper abdominal discomfort starting this morning.  No vomiting or fever.   After 1 dose of fentanyl, her pain is completely resolved and has not come back.  On multiple reexaminations she has no abdominal tenderness.  My suspicion for acute intra-abdominal emergency is low.  Gallbladder ultrasound is unremarkable for any acute process and I have reviewed these images myself.  Labs are unremarkable.  Doubt atypical ACS, especially with troponins  negative x2 and benign  ECG.  We will increase her PPI dose and give Carafate and we discussed return precautions but she otherwise appears stable for discharge home.  I do not think CT is needed given complete resolution of her symptoms. Final Clinical Impression(s) / ED Diagnoses Final diagnoses:  Upper abdominal pain    Rx / DC Orders ED Discharge Orders          Ordered    sucralfate (CARAFATE) 1 GM/10ML suspension  3 times daily with meals & bedtime        07/16/20 1328             Sherwood Gambler, MD 07/16/20 1331

## 2020-07-16 NOTE — Discharge Instructions (Addendum)
If you develop worsening, continued, or recurrent abdominal pain, uncontrolled vomiting, fever, chest or back pain, or any other new/concerning symptoms then return to the ER for evaluation.   You may increase your Omeprazole/Prilosec from 20 mg a day to 40 mg a day to see if this helps your abdominal pain

## 2020-07-16 NOTE — Telephone Encounter (Signed)
Patient called and said that she went to the pharmacy to pick up advair and the symbicort and the pharmacist said that she did not need both of them. She wants to see if dr Neldon Mc wants her stay on both or just one..  [phone].

## 2020-07-16 NOTE — Telephone Encounter (Signed)
Please advise for the advair and the symbicort inhalers.

## 2020-07-16 NOTE — ED Notes (Signed)
Pt ambulatory to restroom without assistance 

## 2020-07-16 NOTE — ED Triage Notes (Signed)
EMS reports from home, Pt c/o abdominal pain since 0700 this morning.  BP 194/118 HR 100 RR 20 Sp02 99 RA

## 2020-07-17 NOTE — Telephone Encounter (Signed)
Please inform Susan Li that she can use Symbicort if this is available at a reasonable price or she can use Advair if this is cheaper.  She should not use both at the same time.

## 2020-07-17 NOTE — Telephone Encounter (Signed)
Pt informed of this and will start doing just one she wants to see the prices first before she settles on one

## 2020-07-25 DIAGNOSIS — G4700 Insomnia, unspecified: Secondary | ICD-10-CM | POA: Diagnosis not present

## 2020-07-25 DIAGNOSIS — F4321 Adjustment disorder with depressed mood: Secondary | ICD-10-CM | POA: Diagnosis not present

## 2020-07-25 DIAGNOSIS — J45909 Unspecified asthma, uncomplicated: Secondary | ICD-10-CM | POA: Diagnosis not present

## 2020-07-25 DIAGNOSIS — F419 Anxiety disorder, unspecified: Secondary | ICD-10-CM | POA: Diagnosis not present

## 2020-07-25 DIAGNOSIS — R109 Unspecified abdominal pain: Secondary | ICD-10-CM | POA: Diagnosis not present

## 2020-08-31 DIAGNOSIS — Z23 Encounter for immunization: Secondary | ICD-10-CM | POA: Diagnosis not present

## 2020-10-29 ENCOUNTER — Ambulatory Visit: Payer: Medicare Other | Admitting: Allergy and Immunology

## 2020-11-02 DIAGNOSIS — Z23 Encounter for immunization: Secondary | ICD-10-CM | POA: Diagnosis not present

## 2020-11-08 DIAGNOSIS — F419 Anxiety disorder, unspecified: Secondary | ICD-10-CM | POA: Diagnosis not present

## 2020-11-08 DIAGNOSIS — G4700 Insomnia, unspecified: Secondary | ICD-10-CM | POA: Diagnosis not present

## 2020-11-08 DIAGNOSIS — M79651 Pain in right thigh: Secondary | ICD-10-CM | POA: Diagnosis not present

## 2020-11-08 DIAGNOSIS — J45909 Unspecified asthma, uncomplicated: Secondary | ICD-10-CM | POA: Diagnosis not present

## 2020-11-08 DIAGNOSIS — R109 Unspecified abdominal pain: Secondary | ICD-10-CM | POA: Diagnosis not present

## 2020-11-08 DIAGNOSIS — F4321 Adjustment disorder with depressed mood: Secondary | ICD-10-CM | POA: Diagnosis not present

## 2020-11-12 ENCOUNTER — Encounter: Payer: Self-pay | Admitting: Allergy and Immunology

## 2020-11-12 ENCOUNTER — Ambulatory Visit (INDEPENDENT_AMBULATORY_CARE_PROVIDER_SITE_OTHER): Payer: Medicare Other | Admitting: Allergy and Immunology

## 2020-11-12 ENCOUNTER — Other Ambulatory Visit: Payer: Self-pay

## 2020-11-12 VITALS — BP 126/72 | HR 88 | Temp 97.4°F | Resp 18 | Ht 63.0 in | Wt 158.1 lb

## 2020-11-12 DIAGNOSIS — K219 Gastro-esophageal reflux disease without esophagitis: Secondary | ICD-10-CM

## 2020-11-12 DIAGNOSIS — F4310 Post-traumatic stress disorder, unspecified: Secondary | ICD-10-CM

## 2020-11-12 DIAGNOSIS — G4720 Circadian rhythm sleep disorder, unspecified type: Secondary | ICD-10-CM

## 2020-11-12 DIAGNOSIS — J4540 Moderate persistent asthma, uncomplicated: Secondary | ICD-10-CM | POA: Diagnosis not present

## 2020-11-12 DIAGNOSIS — J3089 Other allergic rhinitis: Secondary | ICD-10-CM

## 2020-11-12 MED ORDER — TRIAMCINOLONE ACETONIDE 55 MCG/ACT NA AERO
2.0000 | INHALATION_SPRAY | Freq: Every day | NASAL | 5 refills | Status: DC
Start: 2020-11-12 — End: 2021-01-14

## 2020-11-12 MED ORDER — MOMETASONE FUROATE 50 MCG/ACT NA SUSP: 2.0000 | Freq: Every day | NASAL | 5 refills | Status: DC

## 2020-11-12 MED ORDER — ALBUTEROL SULFATE HFA 108 (90 BASE) MCG/ACT IN AERS: 2.0000 | INHALATION_SPRAY | RESPIRATORY_TRACT | 0 refills | Status: DC | PRN

## 2020-11-12 MED ORDER — ZYRTEC ALLERGY 10 MG PO CAPS: 10.0000 mg | ORAL_CAPSULE | Freq: Two times a day (BID) | ORAL | 5 refills | Status: DC | PRN

## 2020-11-12 MED ORDER — OMEPRAZOLE 20 MG PO CPDR: 20.0000 mg | DELAYED_RELEASE_CAPSULE | Freq: Every day | ORAL | 1 refills | Status: DC

## 2020-11-12 MED ORDER — BUDESONIDE-FORMOTEROL FUMARATE 160-4.5 MCG/ACT IN AERO: INHALATION_SPRAY | RESPIRATORY_TRACT | 1 refills | Status: DC

## 2020-11-12 NOTE — Progress Notes (Signed)
[place]   Follow-up Note  Referring Provider: Josetta Huddle, MD Primary Provider: Josetta Huddle, MD Date of Office Visit: 11/12/2020  Subjective:   Susan Li (DOB: Mar 01, 1940) is a 80 y.o. female who returns to the [facility] on 11/12/2020 in re-evaluation of the following:  HPI: [name] returns to this clinic in reevaluation of asthma, allergic rhinitis, and reflux.  Her last visit to this clinic was 09 July 2020.  She has noticed some more wheezing lately.  Her bronchodilator requirement has gone up about 4 times per week.  He does really exercise because of the back pain issue.  This does not appear to disturb her sleep.  She continues to use Symbicort.  Her nose is doing very well at this point in time with some intermittent nasal steroid.  Her reflux is under very good control using omeprazole and occasionally some famotidine.  She informs me that she goes to bed with a Klonopin and a melatonin and then wakes up around 3 or 4:00 in the morning and takes another melatonin and then gets a few more hours of sleep.  She does drink at least 2 glasses of wine per night.  She also tells me that she has been having very significant back pain and apparently had an MRI of her back and hip 2 years ago which may have identified an abnormality.  She was given meloxicam which but she only uses meloxicam when she is in extreme pain.  This meloxicam does appear to help her pain.  She still working through the issue with her husband's death.  She is much better than she was several years ago but still has lots of sad moments throughout the day.  She tried to make contact with Dominic Pea but it has been a difficult logistical issue to hook up with Angela Nevin.  Allergies as of 11/12/2020       Reactions   Morphine And Related Itching   Itching nose   Morphine Sulfate Other (See Comments)   Sertraline Hcl Other (See Comments)         Medication List    Advair HFA 230-21 MCG/ACT inhaler Generic drug: fluticasone-salmeterol Inhale 2 puffs into the lungs 2 (two) times daily. Rinse, gargle and spit out after use.   albuterol 108 (90 Base) MCG/ACT inhaler Commonly known as: Ventolin HFA Inhale 2 puffs into the lungs every 4 (four) hours as needed for wheezing or shortness of breath.   BIOTIN PO Take 5,000 mg by mouth daily.   budesonide-formoterol 160-4.5 MCG/ACT inhaler Commonly known as: SYMBICORT USE 2 PUFFS TWICE DAILY.   CALCIUM + D PO Take 1,200 mg by mouth daily.   clonazePAM 0.5 MG tablet Commonly known as: KLONOPIN Take 0.5 mg by mouth at bedtime.   DULoxetine 30 MG capsule Commonly known as: CYMBALTA Take 1 capsule by mouth daily.   estradiol 1 MG tablet Commonly known as: ESTRACE Take 0.5 mg by mouth daily.   famotidine 20 MG tablet Commonly known as: Pepcid Take 1 tablet (20 mg total) by mouth daily as needed. Take one tablet by mouth daily at bedtime   loperamide 2 MG capsule Commonly known as: IMODIUM Take 1 capsule (2 mg total) by mouth 4 (four) times daily as needed for diarrhea or loose stools.   mometasone 50 MCG/ACT nasal spray Commonly known as: NASONEX 2 sprays in each nostril   NAUZENE PO Take 1 tablet by  mouth daily as needed (nausea).   neomycin-polymyxin b-dexamethasone 3.5-10000-0.1 Oint Commonly known as: MAXITROL   omeprazole 20 MG capsule Commonly known as: PRILOSEC Take 1 capsule (20 mg total) by mouth daily.   potassium chloride SA 20 MEQ tablet Commonly known as: KLOR-CON Take 1 tablet (20 mEq total) by mouth 2 (two) times daily.   simethicone 125 MG chewable tablet Commonly known as: MYLICON Chew 846 mg by mouth every 6 (six) hours as needed for flatulence.   sucralfate 1 GM/10ML suspension Commonly known as: Carafate Take 10 mLs (1 g total) by mouth 4 (four) times daily -  with meals and at bedtime.   triamcinolone 55 MCG/ACT Aero nasal  inhaler Commonly known as: NASACORT Place 2 sprays into the nose daily.   vitamin C with rose hips 500 MG tablet Take 500 mg by mouth daily.   ZyrTEC Allergy 10 MG Caps Generic drug: Cetirizine HCl 1 tablet    Past Medical History:  Diagnosis Date   Arthritis    hips,backs,hands; osteoarthritis   Asthma    Symbicort as needed 3x week   Bursitis    Fibromyalgia    GERD (gastroesophageal reflux disease)    Insomnia    Sciatica of right side    Vitamin D deficiency     Past Surgical History:  Procedure Laterality Date   ABDOMINAL HYSTERECTOMY  2000   Buninonectomy  2008   CERVICAL [place] SURGERY  2004   COLONOSCOPY WITH PROPOFOL N/A 04/25/2012   Procedure: COLONOSCOPY WITH PROPOFOL;  Surgeon: Garlan Fair, MD;  Location: [facility];  Service: Endoscopy;  Laterality: N/A;   LASIK  2004   left shoulder surgery     RESECTION DISTAL CLAVICAL Left 08/24/2014   Procedure: RESECTION DISTAL CLAVICAL;  Surgeon: Melrose Nakayama, MD;  Location: [facility];  Service: Orthopedics;  Laterality: Left;   SHOULDER ARTHROSCOPY WITH SUBACROMIAL DECOMPRESSION Left 08/24/2014   Procedure: ARTHROSCOPY  LEFT SHOULDER, SUBACROMIAL DECOMPRESSION, DISTAL CLAVICLE RESECTION AND DEBRIDEMENT;  Surgeon: Melrose Nakayama, MD;  Location: [place];  Service: Orthopedics;  Laterality: Left;   Tendernitis Surgery  99    Review of systems negative except as noted in HPI / PMHx or noted below:  Review of Systems  Constitutional: Negative.   HENT: Negative.    Eyes: Negative.   Respiratory: Negative.    Cardiovascular: Negative.   Gastrointestinal: Negative.   Genitourinary: Negative.   Musculoskeletal: Negative.   Skin: Negative.   Neurological: Negative.   Endo/Heme/Allergies: Negative.   Psychiatric/Behavioral: Negative.      Objective:   Vitals:   11/12/20 1416  BP: 126/72  Pulse: 88  Resp: 18  Temp: (!) 97.4 F (36.3 C)  SpO2: 97%   Height: 5\' 3"  (160  cm)  Weight: 158 lb 2 oz (71.7 kg)   Physical Exam Constitutional:      Appearance: She is not diaphoretic.  HENT:     Head: Normocephalic.     Right Ear: Tympanic membrane, ear canal and external ear normal.     Left Ear: Tympanic membrane, ear canal and external ear normal.     Nose: Nose normal. No mucosal edema or rhinorrhea.     Mouth/Throat:     Pharynx: Uvula midline. No oropharyngeal exudate.  Eyes:     Conjunctiva/sclera: Conjunctivae normal.  Neck:     Thyroid: No thyromegaly.     Trachea: Trachea normal. No tracheal tenderness or tracheal deviation.  Cardiovascular:     Rate and  Rhythm: Normal rate and regular rhythm.     Heart sounds: Normal heart sounds, S1 normal and S2 normal. No murmur heard. Pulmonary:     Effort: No respiratory distress.     Breath sounds: Normal breath sounds. No stridor. No wheezing or rales.  Lymphadenopathy:     Head:     Right side of head: No tonsillar adenopathy.     Left side of head: No tonsillar adenopathy.     Cervical: No cervical adenopathy.  Skin:    Findings: No erythema or rash.     Nails: There is no clubbing.  Neurological:     Mental Status: She is alert.    Diagnostics:    Spirometry was performed and demonstrated an FEV1 of 1.32 at 70 % of predicted.  Assessment and Plan:   1. Not well controlled moderate persistent asthma   2. Other allergic rhinitis   3. LPRD (laryngopharyngeal reflux disease)   4. PTSD (post-traumatic stress disorder)   5. Dysfunction of sleep stage or arousal      1. Continue Symbicort 160 mg  2 puffs 2 times a day (EMPTY LUNGS)  2. Continue omeprazole 20 mg in morning   3. Continue Nasacort - 1 spray each nostril 1 time per day during upper airway symptoms      4. Continue OTC Zyrtec 1 time per day if needed  5. Continue ProAir HFA 2 puffs every 4-6 hours if needed  6. Continue famotidine 20 mg in evening if needed  7. Work on sleep hygiene   8. Use your meloxicam daily  9.  Return to clinic in 4 weeks or earlier if problem  10. Alyson Locket - [phone]  I am going to have Nema continue to use Symbicort twice a day but using a different technique which will be more efficient in getting this medication into her small airways and we reviewed that technique during today's visit.  She will continue to treat her upper airway disease and her reflux with the therapy noted above.  I did have a talk with her today about working on sleep hygiene including decreasing her alcohol use at night.  And she appears to have some form of musculoskeletal issue involving her back and I encouraged her to use her meloxicam on a daily basis to see if this helps with that issue.  Finally, I asked her to visit with therapist Dominic Pea if she feels that she still continues to have problems dealing with her husband's passing.  I will see her back in this clinic in 4 weeks to assess her response to this approach.  Allena Katz, MD Allergy / Immunology [place]

## 2020-11-12 NOTE — Patient Instructions (Signed)
  1. Continue Symbicort 160 mg  2 puffs 2 times a day (EMPTY LUNGS)  2. Continue omeprazole 20 mg in morning   3. Continue Nasacort - 1 spray each nostril 1 time per day during upper airway symptoms      4. Continue OTC Zyrtec 1 time per day if needed  5. Continue ProAir HFA 2 puffs every 4-6 hours if needed  6. Continue famotidine 20 mg in evening if needed  7. Work on sleep hygiene   8. Use your meloxicam daily  9. Return to clinic in 4 weeks or earlier if problem  10. Alyson Locket - [phone]

## 2020-11-13 ENCOUNTER — Encounter: Payer: Self-pay | Admitting: Allergy and Immunology

## 2020-11-14 ENCOUNTER — Telehealth: Payer: Self-pay | Admitting: Allergy and Immunology

## 2020-11-14 MED ORDER — MELOXICAM 7.5 MG PO TABS: 7.5000 mg | ORAL_TABLET | Freq: Every day | ORAL | 5 refills | Status: DC

## 2020-11-14 NOTE — Telephone Encounter (Signed)
Patient called and said that the Meloxicam needs to be called into [facility] [place].

## 2020-11-14 NOTE — Addendum Note (Signed)
Addended by: Clovis Cao A on: 11/14/2020 03:16 PM   Modules accepted: Orders

## 2020-12-11 ENCOUNTER — Emergency Department (HOSPITAL_COMMUNITY): Payer: Medicare Other

## 2020-12-11 ENCOUNTER — Emergency Department (HOSPITAL_COMMUNITY)
Admission: EM | Admit: 2020-12-11 | Discharge: 2020-12-11 | Disposition: A | Discharge status: Home / Self Care | Payer: Medicare Other | Attending: Emergency Medicine | Admitting: Emergency Medicine

## 2020-12-11 ENCOUNTER — Other Ambulatory Visit: Payer: Self-pay

## 2020-12-11 ENCOUNTER — Encounter (HOSPITAL_COMMUNITY): Payer: Self-pay

## 2020-12-11 DIAGNOSIS — G319 Degenerative disease of nervous system, unspecified: Secondary | ICD-10-CM | POA: Diagnosis not present

## 2020-12-11 DIAGNOSIS — Y900 Blood alcohol level of less than 20 mg/100 ml: Secondary | ICD-10-CM | POA: Insufficient documentation

## 2020-12-11 DIAGNOSIS — G9389 Other specified disorders of brain: Secondary | ICD-10-CM | POA: Diagnosis not present

## 2020-12-11 DIAGNOSIS — M47812 Spondylosis without myelopathy or radiculopathy, cervical region: Secondary | ICD-10-CM | POA: Diagnosis not present

## 2020-12-11 DIAGNOSIS — S0990XA Unspecified injury of head, initial encounter: Secondary | ICD-10-CM | POA: Insufficient documentation

## 2020-12-11 DIAGNOSIS — R58 Hemorrhage, not elsewhere classified: Secondary | ICD-10-CM | POA: Diagnosis not present

## 2020-12-11 DIAGNOSIS — R220 Localized swelling, mass and lump, head: Secondary | ICD-10-CM | POA: Diagnosis not present

## 2020-12-11 DIAGNOSIS — R9431 Abnormal electrocardiogram [ECG] [EKG]: Secondary | ICD-10-CM | POA: Diagnosis not present

## 2020-12-11 DIAGNOSIS — W1839XA Other fall on same level, initial encounter: Secondary | ICD-10-CM | POA: Insufficient documentation

## 2020-12-11 DIAGNOSIS — W19XXXA Unspecified fall, initial encounter: Secondary | ICD-10-CM

## 2020-12-11 DIAGNOSIS — Z79899 Other long term (current) drug therapy: Secondary | ICD-10-CM | POA: Diagnosis not present

## 2020-12-11 DIAGNOSIS — J45909 Unspecified asthma, uncomplicated: Secondary | ICD-10-CM | POA: Insufficient documentation

## 2020-12-11 DIAGNOSIS — I10 Essential (primary) hypertension: Secondary | ICD-10-CM | POA: Diagnosis not present

## 2020-12-11 DIAGNOSIS — Z043 Encounter for examination and observation following other accident: Secondary | ICD-10-CM | POA: Diagnosis not present

## 2020-12-11 DIAGNOSIS — M160 Bilateral primary osteoarthritis of hip: Secondary | ICD-10-CM | POA: Diagnosis not present

## 2020-12-11 LAB — COMPREHENSIVE METABOLIC PANEL
ALT: 35 U/L (ref 0–44)
AST: 28 U/L (ref 15–41)
Albumin: 4.1 g/dL (ref 3.5–5.0)
Alkaline Phosphatase: 61 U/L (ref 38–126)
Anion gap: 11 (ref 5–15)
BUN: 16 mg/dL (ref 8–23)
CO2: 23 mmol/L (ref 22–32)
Calcium: 9.1 mg/dL (ref 8.9–10.3)
Chloride: 106 mmol/L (ref 98–111)
Creatinine, Ser: 0.6 mg/dL (ref 0.44–1.00)
GFR, Estimated: >60 mL/min (ref >60)
Glucose, Bld: 86 mg/dL (ref 70–99)
Potassium: 3.9 mmol/L (ref 3.5–5.1)
Sodium: 140 mmol/L (ref 135–145)
Total Bilirubin: 0.5 mg/dL (ref 0.3–1.2)
Total Protein: 6.9 g/dL (ref 6.5–8.1)

## 2020-12-11 LAB — ETHANOL: Alcohol, Ethyl (B): <10 mg/dL (ref <10)

## 2020-12-11 LAB — CBC WITH DIFFERENTIAL/PLATELET
Abs Immature Granulocytes: 0.03 10*3/uL (ref 0.00–0.07)
Basophils Absolute: 0.1 10*3/uL (ref 0.0–0.1)
Basophils Relative: 1 %
Eosinophils Absolute: 0.3 10*3/uL (ref 0.0–0.5)
Eosinophils Relative: 3 %
HCT: 40.2 % (ref 36.0–46.0)
Hemoglobin: 13.3 g/dL (ref 12.0–15.0)
Immature Granulocytes: 0 %
Lymphocytes Relative: 22 %
Lymphs Abs: 1.8 10*3/uL (ref 0.7–4.0)
MCH: 32.4 pg (ref 26.0–34.0)
MCHC: 33.1 g/dL (ref 30.0–36.0)
MCV: 97.8 fL (ref 80.0–100.0)
Monocytes Absolute: 0.9 10*3/uL (ref 0.1–1.0)
Monocytes Relative: 11 %
Neutro Abs: 4.9 10*3/uL (ref 1.7–7.7)
Neutrophils Relative %: 63 %
Platelets: 301 10*3/uL (ref 150–400)
RBC: 4.11 MIL/uL (ref 3.87–5.11)
RDW: 12.9 % (ref 11.5–15.5)
WBC: 7.9 10*3/uL (ref 4.0–10.5)
nRBC: 0 % (ref 0.0–0.2)

## 2020-12-11 MED ORDER — BACITRACIN ZINC 500 UNIT/GM EX OINT
TOPICAL_OINTMENT | Freq: Two times a day (BID) | CUTANEOUS | Status: DC
  Filled 2020-12-11: qty 0.9

## 2020-12-11 NOTE — ED Notes (Signed)
An After Visit Summary was printed and given to the patient. Discharge instructions given and no further questions at this time.  Pt states she is calling her neighbor to give her ride home.

## 2020-12-11 NOTE — ED Provider Notes (Signed)
[facility] DEPT Provider Note   CSN: [id number] Arrival date & time: 12/11/20  1644     History Chief Complaint  Patient presents with   Fall   Laceration    SHAKEYLA GIEBLER is a 80 y.o. female who presents via EMS with concern for wound to the back of her head.  She states that she got up around 7:00 this morning and went to the kitchen to make coffee when she found blood all over her kitchen floor.  She returned back to her bedroom where she noticed blood on her pillow.  She does not remember getting up or falling in the nighttime but did find wound to the back of her head on the left side.  States that she was laying in bed all day trying to rest but her head Bleeding and her friend asked her to come to the ER.  She states that she has medicines to help her sleep and melatonin.  She is not on any anticoagulation.   Patient that she had a similar episode about a year ago but never saw anybody for it.  Denies any chest pain palpitation, nausea or vomiting that does not stop, fevers, or chills.  Does states that she had some bourbon today and she does like to drink wine.  Unwilling to elaborate any further about it.  HPI     Past Medical History:  Diagnosis Date   Arthritis    hips,backs,hands; osteoarthritis   Asthma    Symbicort as needed 3x week   Bursitis    Fibromyalgia    GERD (gastroesophageal reflux disease)    Insomnia    Sciatica of right side    Vitamin D deficiency     Patient Active Problem List   Diagnosis Date Noted   Oral candida 11/18/2018   Cervical radiculopathy 06/15/2018   Moderate persistent asthma with acute exacerbation 01/21/2017   Cough 01/21/2017   Other allergic rhinitis 12/30/2016   LPRD (laryngopharyngeal reflux disease) 12/30/2016   Acute sinusitis 12/30/2016   Asthma, well controlled 11/22/2014   Allergic rhinitis due to pollen 11/22/2014    Past Surgical History:  Procedure Laterality Date   ABDOMINAL  HYSTERECTOMY  2000   Buninonectomy  2008   CERVICAL [place] SURGERY  2004   COLONOSCOPY WITH PROPOFOL N/A 04/25/2012   Procedure: COLONOSCOPY WITH PROPOFOL;  Surgeon: Garlan Fair, MD;  Location: [facility];  Service: Endoscopy;  Laterality: N/A;   LASIK  2004   left shoulder surgery     RESECTION DISTAL CLAVICAL Left 08/24/2014   Procedure: RESECTION DISTAL CLAVICAL;  Surgeon: Melrose Nakayama, MD;  Location: [facility];  Service: Orthopedics;  Laterality: Left;   SHOULDER ARTHROSCOPY WITH SUBACROMIAL DECOMPRESSION Left 08/24/2014   Procedure: ARTHROSCOPY  LEFT SHOULDER, SUBACROMIAL DECOMPRESSION, DISTAL CLAVICLE RESECTION AND DEBRIDEMENT;  Surgeon: Melrose Nakayama, MD;  Location: [facility];  Service: Orthopedics;  Laterality: Left;   Tendernitis Surgery  99     OB History   No obstetric history on file.     Family History  Problem Relation Age of Onset   Healthy Mother    Healthy Father    Neuromuscular disorder Neg Hx     Social History   Tobacco Use   Smoking status: Never   Smokeless tobacco: Never  Vaping Use   Vaping Use: Never used  Substance Use Topics   Alcohol use: Yes    Alcohol/week: 14.0 standard drinks    Types:  14 Glasses of wine per week   Drug use: No    Home Medications Prior to Admission medications   Medication Sig Start Date End Date Taking? Authorizing Provider  albuterol (VENTOLIN HFA) 108 (90 Base) MCG/ACT inhaler Inhale 2 puffs into the lungs every 4 (four) hours as needed for wheezing or shortness of breath. 11/12/20   Kozlow, Donnamarie Poag, MD  Ascorbic Acid (VITAMIN C WITH ROSE HIPS) 500 MG tablet Take 500 mg by mouth daily.    [provider]  BIOTIN PO Take 5,000 mg by mouth daily.     [provider]  budesonide-formoterol (SYMBICORT) 160-4.5 MCG/ACT inhaler USE 2 PUFFS TWICE DAILY. 11/12/20   Kozlow, Donnamarie Poag, MD  Calcium Citrate-Vitamin D (CALCIUM + D PO) Take 1,200 mg by mouth daily.    [provider]  Cetirizine HCl (ZYRTEC ALLERGY) 10 MG CAPS Take 1 capsule (10 mg total) by mouth 2 (two) times daily as needed (Can use an extra dose during flare ups). 11/12/20   Kozlow, Donnamarie Poag, MD  clonazePAM (KLONOPIN) 0.5 MG tablet Take 0.5 mg by mouth at bedtime.  05/23/19   [provider]  Dextrose-Fructose-Sod Citrate (NAUZENE PO) Take 1 tablet by mouth daily as needed (nausea).    [provider]  DULoxetine (CYMBALTA) 30 MG capsule Take 1 capsule by mouth daily.    [provider]  estradiol (ESTRACE) 1 MG tablet Take 0.5 mg by mouth daily.     [provider]  famotidine (PEPCID) 20 MG tablet Take 1 tablet (20 mg total) by mouth daily as needed. Take one tablet by mouth daily at bedtime 07/10/20   Kozlow, Donnamarie Poag, MD  fluticasone-salmeterol (ADVAIR HFA) 230-21 MCG/ACT inhaler Inhale 2 puffs into the lungs 2 (two) times daily. Rinse, gargle and spit out after use. 07/10/20   Kozlow, Donnamarie Poag, MD  loperamide (IMODIUM) 2 MG capsule Take 1 capsule (2 mg total) by mouth 4 (four) times daily as needed for diarrhea or loose stools. 12/09/19   Davonna Belling, MD  meloxicam (MOBIC) 7.5 MG tablet Take 1 tablet (7.5 mg total) by mouth daily. 11/14/20   Kozlow, Donnamarie Poag, MD  mometasone (NASONEX) 50 MCG/ACT nasal spray Place 2 sprays into the nose daily. 11/12/20   Kozlow, Donnamarie Poag, MD  neomycin-polymyxin b-dexamethasone (MAXITROL) 3.5-10000-0.1 OINT  05/15/19   [provider]  omeprazole (PRILOSEC) 20 MG capsule Take 1 capsule (20 mg total) by mouth daily. 11/12/20   Kozlow, Donnamarie Poag, MD  potassium chloride SA (KLOR-CON) 20 MEQ tablet Take 1 tablet (20 mEq total) by mouth 2 (two) times daily. 12/09/19   Davonna Belling, MD  simethicone (MYLICON) 254 MG chewable tablet Chew 125 mg by mouth every 6 (six) hours as needed for flatulence.    [provider]  sucralfate (CARAFATE) 1 GM/10ML suspension Take 10 mLs (1 g total) by mouth 4 (four) times daily -  with  meals and at bedtime. 07/16/20   Sherwood Gambler, MD  triamcinolone (NASACORT) 55 MCG/ACT AERO nasal inhaler Place 2 sprays into the nose daily. 11/12/20   Kozlow, Donnamarie Poag, MD    Allergies    Morphine and related, Morphine sulfate, and Sertraline hcl  Review of Systems   Review of Systems  Constitutional: Negative.   HENT: Negative.    Respiratory: Negative.    Cardiovascular: Negative.   Gastrointestinal: Negative.   Genitourinary: Negative.   Skin:  Positive for wound.   Physical Exam Updated Vital Signs BP Marland Kitchen)  159/79   Pulse 90   Temp 98.2 F (36.8 C) (Oral)   Resp 18   SpO2 99%   Physical Exam Vitals and nursing note reviewed.  Constitutional:      Appearance: She is not ill-appearing or toxic-appearing.  HENT:     Head: Normocephalic and atraumatic.      Right Ear: Tympanic membrane normal.     Left Ear: Tympanic membrane normal.     Nose: Nose normal. No congestion.     Mouth/Throat:     Mouth: Mucous membranes are moist.     Pharynx: Oropharynx is clear. Uvula midline. No oropharyngeal exudate or posterior oropharyngeal erythema.     Tonsils: No tonsillar exudate.  Eyes:     General: Lids are normal. Vision grossly intact.        Right eye: No discharge.        Left eye: No discharge.     Extraocular Movements: Extraocular movements intact.     Conjunctiva/sclera: Conjunctivae normal.     Pupils: Pupils are equal, round, and reactive to light.  Neck:     Trachea: Trachea and phonation normal.  Cardiovascular:     Rate and Rhythm: Normal rate and regular rhythm.     Pulses: Normal pulses.     Heart sounds: Normal heart sounds. No murmur heard. Pulmonary:     Effort: Pulmonary effort is normal. No tachypnea, bradypnea, accessory muscle usage, prolonged expiration or respiratory distress.     Breath sounds: Normal breath sounds. No wheezing or rales.  Chest:     Chest wall: No mass, lacerations, deformity, swelling, tenderness, crepitus or edema.  Abdominal:      General: Bowel sounds are normal. There is no distension.     Palpations: Abdomen is soft.     Tenderness: There is no abdominal tenderness. There is no right CVA tenderness, left CVA tenderness, guarding or rebound.  Musculoskeletal:        General: No deformity.     Cervical back: Normal range of motion and neck supple. No edema, rigidity, tenderness or crepitus. No pain with movement, spinous process tenderness or muscular tenderness.     Right lower leg: No edema.     Left lower leg: No edema.  Lymphadenopathy:     Cervical: No cervical adenopathy.  Skin:    General: Skin is warm and dry.     Capillary Refill: Capillary refill takes less than 2 seconds.  Neurological:     General: No focal deficit present.     Mental Status: She is alert and oriented to person, place, and time. Mental status is at baseline.     GCS: GCS eye subscore is 4. GCS verbal subscore is 5. GCS motor subscore is 6.     Cranial Nerves: Cranial nerves 2-12 are intact.     Sensory: Sensation is intact.     Motor: Motor function is intact.     Gait: Gait is intact.  Psychiatric:        Mood and Affect: Mood normal.    ED Results / Procedures / Treatments   Labs (all labs ordered are listed, but only abnormal results are displayed) Labs Reviewed  CBC WITH DIFFERENTIAL/PLATELET  COMPREHENSIVE METABOLIC PANEL  URINALYSIS, ROUTINE W REFLEX MICROSCOPIC  ETHANOL    EKG None  Radiology DG Pelvis 1-2 Views  Result Date: 12/11/2020 CLINICAL DATA:  Status post fall. EXAM: PELVIS - 1-2 VIEW COMPARISON:  May 17, 2019 FINDINGS: There is no evidence of pelvic  fracture or diastasis. No pelvic bone lesions are seen. Marked severity degenerative changes seen involving both hips, in the form of joint space narrowing and acetabular sclerosis. IMPRESSION: 1. No acute findings. 2. 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 Electronically Signed   By: Virgina Norfolk M.D.   On: 12/11/2020 19:08   CT Head Wo  Contrast  Result Date: 12/11/2020 CLINICAL DATA:  Status post fall. EXAM: CT HEAD WITHOUT CONTRAST TECHNIQUE: Contiguous axial images were obtained from the base of the skull through the vertex without intravenous contrast. COMPARISON:  May 08, 2020 FINDINGS: Brain: There is mild cerebral atrophy with widening of the extra-axial spaces and ventricular dilatation. There are areas of decreased attenuation within the white matter tracts of the supratentorial brain, consistent with microvascular disease changes. A stable 6 mm extra-axial calcification is seen within the left frontal lobe. Vascular: No hyperdense vessel or unexpected calcification. Skull: Normal. Negative for fracture or focal lesion. Sinuses/Orbits: No acute finding. Other: There is mild to moderate severity left posterior parietal scalp soft tissue swelling. This extends superiorly towards the vertex. IMPRESSION: 1. Mild to moderate severity left posterior parietal scalp soft tissue swelling without evidence of an acute fracture or acute intracranial abnormality. 2. Stable 6 mm extra-axial calcification within the left frontal lobe which may represent a small meningioma. 3. Generalized cerebral atrophy. Electronically Signed   By: Virgina Norfolk M.D.   On: 12/11/2020 19:11   CT Cervical Spine Wo Contrast  Result Date: 12/11/2020 CLINICAL DATA:  Status post fall. EXAM: CT CERVICAL SPINE WITHOUT CONTRAST TECHNIQUE: Multidetector CT imaging of the cervical spine was performed without intravenous contrast. Multiplanar CT image reconstructions were also generated. COMPARISON:  None. FINDINGS: Alignment: There is reversal of the normal cervical spine lordosis. Approximately 1 mm anterolisthesis of the C7 vertebral body is noted on T1. Skull base and vertebrae: No acute fracture. Chronic degenerative changes are seen involving the body and tip of the dens. No primary bone lesion or focal pathologic process. Soft tissues and spinal canal: No  prevertebral fluid or swelling. No visible canal hematoma. Disc levels: There is mild endplate sclerosis at the level of C3-C4. Marked severity endplate sclerosis is seen at the level of C4-C5. A metallic density fusion plate and screws are seen along the anterior aspects of the C5, C6 and C7 vertebral bodies. Bilateral mild to moderate severity facet joint hypertrophy is seen. This is most prominent at the level of C3-C4. Upper chest: Negative. Other: None. IMPRESSION: 1. Status post anterior cervical fusion at the levels of C5 through C7. 2. Multilevel degenerative changes, most prominent at the level of C4-C5. 3. No acute cervical spine fracture. Electronically Signed   By: Virgina Norfolk M.D.   On: 12/11/2020 19:17    Procedures Procedures   Medications Ordered in ED Medications  bacitracin ointment (has no administration in time range)    ED Course  I have reviewed the triage vital signs and the nursing notes.  Pertinent labs & imaging results that were available during my care of the patient were reviewed by me and considered in my medical decision making (see chart for details).    MDM Rules/Calculators/A&P                         80 year old female who presents with concern for wound to her head, unclear how it was sustained.   VS are normal on intake.  Cardiopulmonary is normal, abdominal exam is  benign.  Patient with boggy hematoma with ruptured skin over the left parietal area.  Patient is neurologically intact without focal deficit on exam.  CBC unremarkable, CMP unremarkable, alcohol is normal.  Plain films of the pelvis and CT of the head and C-spine are without acute change.  EKG without dysrhythmia.  While the exact etiology of this patient's fall remains unclear, does not appear to be any emergent issue at this time.  Her wound was dressed with bacitracin and she was discharged home in good condition.  She may follow-up with her pediatrician.  [place] voiced understanding of  her medical evaluation and treatment plan.  Each of her questions was answered to her expressed satisfaction.  Return precautions were given.  Patient is well-appearing, stable, and appropriate for discharge at this time.  This chart was dictated using voice recognition software, Dragon. Despite the best efforts of this provider to proofread and correct errors, errors may still occur which can change documentation meaning.  Final Clinical Impression(s) / ED Diagnoses Final diagnoses:  Fall    Rx / DC Orders ED Discharge Orders     None        Aura Dials 12/11/20 2044    Fredia Sorrow, MD 12/12/20 2302

## 2020-12-11 NOTE — ED Triage Notes (Signed)
Pt BIB EMS from home. Pt fell this morning at approx 0700, pt does not remember falling. Pt takes meds to help her sleep, and takes melatonin as well. Pt has a small lac to the back of head. EMS reports wound is not bleeding at this time. Pt does not take blood thinners. Pt not sure about LOC.

## 2020-12-11 NOTE — Discharge Instructions (Signed)
You are seen in the ER today for your fall.  Physical exam vital signs, blood work, and CT scans were very reassuring.  While the exact cause of your fall remains unclear there is not appear to be any dangerous injury secondary to your fall.  Please follow with your primary care doctor, keep a clean dressing on your wound, and return to the ER with any new severe symptoms.

## 2020-12-16 DIAGNOSIS — J45909 Unspecified asthma, uncomplicated: Secondary | ICD-10-CM | POA: Diagnosis not present

## 2020-12-16 DIAGNOSIS — M79651 Pain in right thigh: Secondary | ICD-10-CM | POA: Diagnosis not present

## 2020-12-16 DIAGNOSIS — G4700 Insomnia, unspecified: Secondary | ICD-10-CM | POA: Diagnosis not present

## 2020-12-16 DIAGNOSIS — W19XXXA Unspecified fall, initial encounter: Secondary | ICD-10-CM | POA: Diagnosis not present

## 2020-12-16 DIAGNOSIS — F419 Anxiety disorder, unspecified: Secondary | ICD-10-CM | POA: Diagnosis not present

## 2020-12-16 DIAGNOSIS — R109 Unspecified abdominal pain: Secondary | ICD-10-CM | POA: Diagnosis not present

## 2020-12-16 DIAGNOSIS — R55 Syncope and collapse: Secondary | ICD-10-CM | POA: Diagnosis not present

## 2020-12-16 DIAGNOSIS — F4321 Adjustment disorder with depressed mood: Secondary | ICD-10-CM | POA: Diagnosis not present

## 2020-12-23 DIAGNOSIS — M545 Low back pain, unspecified: Secondary | ICD-10-CM | POA: Diagnosis not present

## 2020-12-23 DIAGNOSIS — M25551 Pain in right hip: Secondary | ICD-10-CM | POA: Diagnosis not present

## 2021-01-14 ENCOUNTER — Ambulatory Visit (INDEPENDENT_AMBULATORY_CARE_PROVIDER_SITE_OTHER): Payer: Medicare Other | Admitting: Allergy and Immunology

## 2021-01-14 ENCOUNTER — Other Ambulatory Visit: Payer: Self-pay

## 2021-01-14 VITALS — BP 126/78 | HR 116 | Temp 98.6°F | Resp 16 | Ht 63.5 in | Wt 156.6 lb

## 2021-01-14 DIAGNOSIS — K219 Gastro-esophageal reflux disease without esophagitis: Secondary | ICD-10-CM

## 2021-01-14 DIAGNOSIS — J4540 Moderate persistent asthma, uncomplicated: Secondary | ICD-10-CM | POA: Diagnosis not present

## 2021-01-14 DIAGNOSIS — J3089 Other allergic rhinitis: Secondary | ICD-10-CM | POA: Diagnosis not present

## 2021-01-14 DIAGNOSIS — U071 COVID-19: Secondary | ICD-10-CM

## 2021-01-14 MED ORDER — BUDESONIDE-FORMOTEROL FUMARATE 160-4.5 MCG/ACT IN AERO: INHALATION_SPRAY | RESPIRATORY_TRACT | 1 refills | Status: DC

## 2021-01-14 MED ORDER — CETIRIZINE HCL 10 MG PO TABS: 10.0000 mg | ORAL_TABLET | Freq: Two times a day (BID) | ORAL | 5 refills | Status: DC | PRN

## 2021-01-14 MED ORDER — TRIAMCINOLONE ACETONIDE 55 MCG/ACT NA AERO: 1.0000 | INHALATION_SPRAY | Freq: Every day | NASAL | 5 refills | Status: DC

## 2021-01-14 MED ORDER — FAMOTIDINE 20 MG PO TABS: 20.0000 mg | ORAL_TABLET | Freq: Every day | ORAL | 5 refills | Status: DC | PRN

## 2021-01-14 MED ORDER — ALBUTEROL SULFATE HFA 108 (90 BASE) MCG/ACT IN AERS: 2.0000 | INHALATION_SPRAY | RESPIRATORY_TRACT | 0 refills | Status: DC | PRN

## 2021-01-14 NOTE — Progress Notes (Signed)
[place]   Follow-up Note  Referring Provider: Josetta Huddle, MD Primary Provider: Josetta Huddle, MD Date of Office Visit: 01/14/2021  Subjective:   Susan Li (DOB: 03-Jul-1940) is a 81 y.o. female who returns to the [facility] on 01/14/2021 in re-evaluation of the following:  HPI: Susan Li presents to this clinic in evaluation of asthma and allergic rhinitis and reflux.  Her last visit to this clinic was 12 November 2020.  For the past 48 hours she developed acute onset sneezing and coughing and blowing her nose constantly.  She is actually better over the course of the past 24 hours.  She never had any fever or anosmia.  Most of her cough has resolved.  Most of her sneezing has resolved.  She still blowing her nose.  She has had 5 COVID vaccines and has received this year's flu vaccine.  Overall she has done well.  She has been consistently using her Symbicort and has been consistently using the nasal steroid has been consistently treating her reflux and overall she feels as though her upper airway and lower airway and reflux issue is going quite well on her current plan.  When I last saw her in this clinic she was having some issues with dysfunctional sleep which is actually a lot better at this point in time.  She has decreased her dose of Klonopin and she continues to use melatonin and she has eliminated the use of wine consumption at nighttime.  As well, she was having some very significant back issues and she is scheduled to see a sports medicine doctor at some point in the future.  Allergies as of 01/14/2021       Reactions   Morphine And Related Itching   Itching nose   Morphine Sulfate Other (See Comments)   Other reaction(s): itchy nose   Sertraline Hcl Other (See Comments)   Other reaction(s): diarrhea   Zolpidem Tartrate    Other reaction(s): nocturnal syncope        Medication List    Advair HFA 230-21  MCG/ACT inhaler Generic drug: fluticasone-salmeterol Inhale 2 puffs into the lungs 2 (two) times daily. Rinse, gargle and spit out after use.   albuterol 108 (90 Base) MCG/ACT inhaler Commonly known as: Ventolin HFA Inhale 2 puffs into the lungs every 4 (four) hours as needed for wheezing or shortness of breath.   BIOTIN PO Take 5,000 mg by mouth daily.   budesonide-formoterol 160-4.5 MCG/ACT inhaler Commonly known as: SYMBICORT USE 2 PUFFS TWICE DAILY.   CALCIUM + D PO Take 1,200 mg by mouth daily.   clonazePAM 0.5 MG tablet Commonly known as: KLONOPIN Take 0.5 mg by mouth at bedtime.   DULoxetine 30 MG capsule Commonly known as: CYMBALTA Take 1 capsule by mouth daily.   estradiol 1 MG tablet Commonly known as: ESTRACE Take 0.5 mg by mouth daily.   famotidine 20 MG tablet Commonly known as: Pepcid Take 1 tablet (20 mg total) by mouth daily as needed. Take one tablet by mouth daily at bedtime   loperamide 2 MG capsule Commonly known as: IMODIUM Take 1 capsule (2 mg total) by mouth 4 (four) times daily as needed for diarrhea or loose stools.   melatonin 1 MG Tabs tablet 2 tablet at bedtime as needed   meloxicam 7.5 MG tablet Commonly known as: Mobic Take 1 tablet (7.5 mg total) by mouth daily.   omeprazole 20 MG capsule  Commonly known as: PRILOSEC Take 1 capsule (20 mg total) by mouth daily.   vitamin C with rose hips 500 MG tablet Take 500 mg by mouth daily.    Past Medical History:  Diagnosis Date   Arthritis    hips,backs,hands; osteoarthritis   Asthma    Symbicort as needed 3x week   Bursitis    Fibromyalgia    GERD (gastroesophageal reflux disease)    Insomnia    Sciatica of right side    Vitamin D deficiency     Past Surgical History:  Procedure Laterality Date   ABDOMINAL HYSTERECTOMY  2000   Buninonectomy  2008   CERVICAL [place] SURGERY  2004   COLONOSCOPY WITH PROPOFOL N/A 04/25/2012   Procedure: COLONOSCOPY WITH PROPOFOL;  Surgeon: Garlan Fair, MD;  Location: [facility];  Service: Endoscopy;  Laterality: N/A;   LASIK  2004   left shoulder surgery     RESECTION DISTAL CLAVICAL Left 08/24/2014   Procedure: RESECTION DISTAL CLAVICAL;  Surgeon: Melrose Nakayama, MD;  Location: [place] [facility];  Service: Orthopedics;  Laterality: Left;   SHOULDER ARTHROSCOPY WITH SUBACROMIAL DECOMPRESSION Left 08/24/2014   Procedure: ARTHROSCOPY  LEFT SHOULDER, SUBACROMIAL DECOMPRESSION, DISTAL CLAVICLE RESECTION AND DEBRIDEMENT;  Surgeon: Melrose Nakayama, MD;  Location: [place] [facility];  Service: Orthopedics;  Laterality: Left;   Tendernitis Surgery  99    Review of systems negative except as noted in HPI / PMHx or noted below:  Review of Systems  Constitutional: Negative.   HENT: Negative.    Eyes: Negative.   Respiratory: Negative.    Cardiovascular: Negative.   Gastrointestinal: Negative.   Genitourinary: Negative.   Musculoskeletal: Negative.   Skin: Negative.   Neurological: Negative.   Endo/Heme/Allergies: Negative.   Psychiatric/Behavioral: Negative.      Objective:   Vitals:   01/14/21 1334  BP: 126/78  Pulse: (!) 116  Resp: 16  Temp: 98.6 F (37 C)  SpO2: 96%   Height: 5' 3.5" (161.3 cm)  Weight: 156 lb 9.6 oz (71 kg)   Physical Exam Constitutional:      Appearance: She is not diaphoretic.  HENT:     Head: Normocephalic.     Right Ear: Tympanic membrane, ear canal and external ear normal.     Left Ear: Tympanic membrane, ear canal and external ear normal.     Nose: Nose normal. No mucosal edema or rhinorrhea.     Mouth/Throat:     Pharynx: Uvula midline. No oropharyngeal exudate.  Eyes:     Conjunctiva/sclera: Conjunctivae normal.  Neck:     Thyroid: No thyromegaly.     Trachea: Trachea normal. No tracheal tenderness or tracheal deviation.  Cardiovascular:     Rate and Rhythm: Normal rate and regular rhythm.     Heart sounds: Normal heart sounds, S1 normal and S2 normal. No murmur  heard. Pulmonary:     Effort: No respiratory distress.     Breath sounds: Normal breath sounds. No stridor. No wheezing or rales.  Lymphadenopathy:     Head:     Right side of head: No tonsillar adenopathy.     Left side of head: No tonsillar adenopathy.     Cervical: No cervical adenopathy.  Skin:    Findings: No erythema or rash.     Nails: There is no clubbing.  Neurological:     Mental Status: She is alert.    Diagnostics: BIANAX NOW COVID SWAB Positive .    Assessment and Plan:  1. Asthma, moderate persistent, well-controlled   2. Other allergic rhinitis   3. LPRD (laryngopharyngeal reflux disease)   4. COVID-19 virus infection      1. Continue Symbicort 160 mg  2 puffs 2 times a day (EMPTY LUNGS)  2. Continue omeprazole 20 mg 1 time per day  3. Continue Nasacort - 1 spray each nostril 1 time per day during upper airway symptoms      4. Continue the following if needed:  A.  OTC Zyrtec 1 time per day   B.  ProAir HFA 2 puffs every 4-6 hours    C.  Famotidine 20 mg in evening    5. Prednisone 10 mg - 1 tablet now and tomorrow and thursday  6. Further treatment for COVID infection???  7. Return to clinic in 12 weeks or earlier if problem  Susan Li appears to have a COVID infection giving rise to her respiratory tract symptoms and this appears to be very mild.  She is already better over the course of the past 24 hours so she basically had a upper respiratory tract infection with cough presentation as a manifestation of her COVID.  I do not think I am going to give her any antivirals at this point as she is already better and she has had 5 COVID vaccines but I will give her some prednisone for just a few days to help with some of the inflammation that appears to have developed with this issue.  Overall she has had pretty good control of her respiratory tract disease on her current plan of anti-inflammatory medications for her airway and therapy directed against reflux as  noted above.  Assuming she does well I will see her back in this clinic in 12 weeks or earlier if there is a problem.  Allena Katz, MD Allergy / Immunology [place]

## 2021-01-14 NOTE — Patient Instructions (Signed)
°  1. Continue Symbicort 160 mg  2 puffs 2 times a day (EMPTY LUNGS)  2. Continue omeprazole 20 mg 1 time per day  3. Continue Nasacort - 1 spray each nostril 1 time per day during upper airway symptoms      4. Continue the following if needed:  A.  OTC Zyrtec 1 time per day   B.  ProAir HFA 2 puffs every 4-6 hours    C.  Famotidine 20 mg in evening    5. Prednisone 10 mg - 1 tablet now and tomorrow and thursday  6. Further treatment for COVID infection???  7. Return to clinic in 12 weeks or earlier if problem

## 2021-01-15 ENCOUNTER — Encounter: Payer: Self-pay | Admitting: Allergy and Immunology

## 2021-01-20 DIAGNOSIS — U071 COVID-19: Secondary | ICD-10-CM | POA: Diagnosis not present

## 2021-01-25 DIAGNOSIS — U071 COVID-19: Secondary | ICD-10-CM | POA: Diagnosis not present

## 2021-01-27 DIAGNOSIS — M79604 Pain in right leg: Secondary | ICD-10-CM | POA: Diagnosis not present

## 2021-01-27 DIAGNOSIS — R269 Unspecified abnormalities of gait and mobility: Secondary | ICD-10-CM | POA: Diagnosis not present

## 2021-01-27 DIAGNOSIS — M25551 Pain in right hip: Secondary | ICD-10-CM | POA: Diagnosis not present

## 2021-01-27 DIAGNOSIS — M545 Low back pain, unspecified: Secondary | ICD-10-CM | POA: Diagnosis not present

## 2021-02-04 DIAGNOSIS — M25551 Pain in right hip: Secondary | ICD-10-CM | POA: Diagnosis not present

## 2021-02-04 DIAGNOSIS — M79604 Pain in right leg: Secondary | ICD-10-CM | POA: Diagnosis not present

## 2021-02-04 DIAGNOSIS — R269 Unspecified abnormalities of gait and mobility: Secondary | ICD-10-CM | POA: Diagnosis not present

## 2021-02-04 DIAGNOSIS — M545 Low back pain, unspecified: Secondary | ICD-10-CM | POA: Diagnosis not present

## 2021-02-11 DIAGNOSIS — Z0000 Encounter for general adult medical examination without abnormal findings: Secondary | ICD-10-CM | POA: Diagnosis not present

## 2021-02-11 DIAGNOSIS — M25551 Pain in right hip: Secondary | ICD-10-CM | POA: Diagnosis not present

## 2021-02-11 DIAGNOSIS — M545 Low back pain, unspecified: Secondary | ICD-10-CM | POA: Diagnosis not present

## 2021-02-11 DIAGNOSIS — F419 Anxiety disorder, unspecified: Secondary | ICD-10-CM | POA: Diagnosis not present

## 2021-02-11 DIAGNOSIS — R350 Frequency of micturition: Secondary | ICD-10-CM | POA: Diagnosis not present

## 2021-02-11 DIAGNOSIS — J45909 Unspecified asthma, uncomplicated: Secondary | ICD-10-CM | POA: Diagnosis not present

## 2021-02-11 DIAGNOSIS — E781 Pure hyperglyceridemia: Secondary | ICD-10-CM | POA: Diagnosis not present

## 2021-02-11 DIAGNOSIS — G8929 Other chronic pain: Secondary | ICD-10-CM | POA: Diagnosis not present

## 2021-02-11 DIAGNOSIS — Z789 Other specified health status: Secondary | ICD-10-CM | POA: Diagnosis not present

## 2021-02-11 DIAGNOSIS — G4700 Insomnia, unspecified: Secondary | ICD-10-CM | POA: Diagnosis not present

## 2021-02-11 DIAGNOSIS — F322 Major depressive disorder, single episode, severe without psychotic features: Secondary | ICD-10-CM | POA: Diagnosis not present

## 2021-02-11 DIAGNOSIS — E559 Vitamin D deficiency, unspecified: Secondary | ICD-10-CM | POA: Diagnosis not present

## 2021-02-12 DIAGNOSIS — M1611 Unilateral primary osteoarthritis, right hip: Secondary | ICD-10-CM | POA: Diagnosis not present

## 2021-02-18 DIAGNOSIS — M25551 Pain in right hip: Secondary | ICD-10-CM | POA: Diagnosis not present

## 2021-02-18 DIAGNOSIS — M545 Low back pain, unspecified: Secondary | ICD-10-CM | POA: Diagnosis not present

## 2021-02-18 DIAGNOSIS — R269 Unspecified abnormalities of gait and mobility: Secondary | ICD-10-CM | POA: Diagnosis not present

## 2021-02-18 DIAGNOSIS — M79604 Pain in right leg: Secondary | ICD-10-CM | POA: Diagnosis not present

## 2021-02-20 DIAGNOSIS — Z1231 Encounter for screening mammogram for malignant neoplasm of breast: Secondary | ICD-10-CM | POA: Diagnosis not present

## 2021-02-25 DIAGNOSIS — M25551 Pain in right hip: Secondary | ICD-10-CM | POA: Diagnosis not present

## 2021-02-25 DIAGNOSIS — M545 Low back pain, unspecified: Secondary | ICD-10-CM | POA: Diagnosis not present

## 2021-02-25 DIAGNOSIS — M79604 Pain in right leg: Secondary | ICD-10-CM | POA: Diagnosis not present

## 2021-02-25 DIAGNOSIS — R269 Unspecified abnormalities of gait and mobility: Secondary | ICD-10-CM | POA: Diagnosis not present

## 2021-03-04 DIAGNOSIS — M79604 Pain in right leg: Secondary | ICD-10-CM | POA: Diagnosis not present

## 2021-03-04 DIAGNOSIS — M545 Low back pain, unspecified: Secondary | ICD-10-CM | POA: Diagnosis not present

## 2021-03-04 DIAGNOSIS — R269 Unspecified abnormalities of gait and mobility: Secondary | ICD-10-CM | POA: Diagnosis not present

## 2021-03-04 DIAGNOSIS — M25551 Pain in right hip: Secondary | ICD-10-CM | POA: Diagnosis not present

## 2021-03-11 DIAGNOSIS — M79604 Pain in right leg: Secondary | ICD-10-CM | POA: Diagnosis not present

## 2021-03-11 DIAGNOSIS — M25551 Pain in right hip: Secondary | ICD-10-CM | POA: Diagnosis not present

## 2021-03-11 DIAGNOSIS — R269 Unspecified abnormalities of gait and mobility: Secondary | ICD-10-CM | POA: Diagnosis not present

## 2021-03-11 DIAGNOSIS — M545 Low back pain, unspecified: Secondary | ICD-10-CM | POA: Diagnosis not present

## 2021-03-13 DIAGNOSIS — M25551 Pain in right hip: Secondary | ICD-10-CM | POA: Diagnosis not present

## 2021-05-27 DIAGNOSIS — Z79899 Other long term (current) drug therapy: Secondary | ICD-10-CM | POA: Diagnosis not present

## 2021-05-27 DIAGNOSIS — R2990 Unspecified symptoms and signs involving the nervous system: Secondary | ICD-10-CM | POA: Diagnosis not present

## 2021-06-19 DIAGNOSIS — F5101 Primary insomnia: Secondary | ICD-10-CM | POA: Diagnosis not present

## 2021-06-19 DIAGNOSIS — R2990 Unspecified symptoms and signs involving the nervous system: Secondary | ICD-10-CM | POA: Diagnosis not present

## 2021-07-25 ENCOUNTER — Telehealth: Payer: Self-pay | Admitting: Allergy and Immunology

## 2021-07-25 MED ORDER — BUDESONIDE-FORMOTEROL FUMARATE 160-4.5 MCG/ACT IN AERO: INHALATION_SPRAY | RESPIRATORY_TRACT | 0 refills | Status: DC

## 2021-07-25 NOTE — Telephone Encounter (Signed)
Rx have been sent as request. Message left for patient to return my call.

## 2021-07-25 NOTE — Telephone Encounter (Signed)
Patient states she is needing a refill on her Symbicort inhaler. She has an OV with Dr. Neldon Mc on 08/26/21.   [place]

## 2021-08-11 DIAGNOSIS — M79651 Pain in right thigh: Secondary | ICD-10-CM | POA: Diagnosis not present

## 2021-08-11 DIAGNOSIS — R2681 Unsteadiness on feet: Secondary | ICD-10-CM | POA: Diagnosis not present

## 2021-08-11 DIAGNOSIS — F5101 Primary insomnia: Secondary | ICD-10-CM | POA: Diagnosis not present

## 2021-08-26 ENCOUNTER — Ambulatory Visit (INDEPENDENT_AMBULATORY_CARE_PROVIDER_SITE_OTHER): Payer: Medicare Other | Admitting: Allergy and Immunology

## 2021-08-26 ENCOUNTER — Encounter: Payer: Self-pay | Admitting: Allergy and Immunology

## 2021-08-26 VITALS — BP 130/78 | HR 78 | Temp 98.0°F | Resp 16 | Ht 64.0 in | Wt 163.2 lb

## 2021-08-26 DIAGNOSIS — M5416 Radiculopathy, lumbar region: Secondary | ICD-10-CM

## 2021-08-26 DIAGNOSIS — K219 Gastro-esophageal reflux disease without esophagitis: Secondary | ICD-10-CM

## 2021-08-26 DIAGNOSIS — J3089 Other allergic rhinitis: Secondary | ICD-10-CM | POA: Diagnosis not present

## 2021-08-26 DIAGNOSIS — J4540 Moderate persistent asthma, uncomplicated: Secondary | ICD-10-CM

## 2021-08-26 MED ORDER — TRIAMCINOLONE ACETONIDE 55 MCG/ACT NA AERO: 1.0000 | INHALATION_SPRAY | Freq: Every day | NASAL | 5 refills | Status: DC

## 2021-08-26 MED ORDER — BUDESONIDE-FORMOTEROL FUMARATE 160-4.5 MCG/ACT IN AERO: 2.0000 | INHALATION_SPRAY | Freq: Two times a day (BID) | RESPIRATORY_TRACT | 1 refills | Status: DC

## 2021-08-26 MED ORDER — OMEPRAZOLE 20 MG PO CPDR: 20.0000 mg | DELAYED_RELEASE_CAPSULE | Freq: Every morning | ORAL | 1 refills | Status: DC

## 2021-08-26 MED ORDER — CETIRIZINE HCL 10 MG PO TABS: 10.0000 mg | ORAL_TABLET | Freq: Every day | ORAL | 5 refills | Status: DC | PRN

## 2021-08-26 MED ORDER — VENTOLIN HFA 108 (90 BASE) MCG/ACT IN AERS: 2.0000 | INHALATION_SPRAY | RESPIRATORY_TRACT | 1 refills | Status: DC | PRN

## 2021-08-26 MED ORDER — FAMOTIDINE 20 MG PO TABS: 20.0000 mg | ORAL_TABLET | Freq: Every evening | ORAL | 5 refills | Status: DC

## 2021-08-26 NOTE — Progress Notes (Unsigned)
[place] [facility] [place] [facility] [place]   Follow-up Note  Referring Provider: Josetta Huddle, MD Primary Provider: Charlane Ferretti, MD Date of Office Visit: 08/26/2021  Subjective:   Susan Li (DOB: 10/20/1940) is a 81 y.o. female who returns to the [facility] on 08/26/2021 in re-evaluation of the following:  HPI: Susan Li presents to this clinic in evaluation of asthma and allergic rhinitis and reflux.  Her last visit to this clinic was 14 January 2021.  She is really done well with her asthma has not had any significant problems that required her to use a systemic steroid to treat an exacerbation and while she continues to use Symbicort on a consistent basis twice a day she rarely uses any short acting bronchodilator.  She does not really exercise to any significant degree as he is having significant back pain and it appears to be having some right lower extremity radicular pain as well and she has a balance issue.  She had very little problems with her nose and she has not been using any nasal steroid.  Her reflux is under good control using omeprazole.  Occasionally when she drinks some bourbon she will have some regurgitation and burning but otherwise does very well and has not had to use any famotidine.  Allergies as of 08/26/2021       Reactions   Morphine And Related Itching   Itching nose   Morphine Sulfate Other (See Comments)   Other reaction(s): itchy nose   Sertraline Hcl Other (See Comments)   Other reaction(s): diarrhea   Zolpidem Tartrate    Other reaction(s): nocturnal syncope        Medication List    albuterol 108 (90 Base) MCG/ACT inhaler Commonly known as: Ventolin HFA Inhale 2 puffs into the lungs every 4 (four) hours as needed for wheezing or shortness of breath.   BIOTIN PO Take 5,000 mg by mouth daily.   budesonide-formoterol 160-4.5 MCG/ACT inhaler Commonly known as: SYMBICORT USE 2 PUFFS TWICE DAILY.   CALCIUM  + D PO Take 1,200 mg by mouth daily.   cetirizine 10 MG tablet Commonly known as: ZYRTEC Take 1 tablet (10 mg total) by mouth 2 (two) times daily as needed for allergies (Can take an extra dose during flare ups.).   cholecalciferol 25 MCG (1000 UNIT) tablet Commonly known as: VITAMIN D3 Take 1 tablet by mouth daily.   estradiol 1 MG tablet Commonly known as: ESTRACE Take 0.5 mg by mouth daily.   famotidine 20 MG tablet Commonly known as: Pepcid Take 1 tablet (20 mg total) by mouth daily as needed. Take one tablet by mouth daily at bedtime   melatonin 1 MG Tabs tablet 2 tablet at bedtime as needed   omeprazole 20 MG capsule Commonly known as: PRILOSEC Take 1 capsule (20 mg total) by mouth daily.   vitamin C with rose hips 500 MG tablet Take 500 mg by mouth daily.    Past Medical History:  Diagnosis Date  . Arthritis    hips,backs,hands; osteoarthritis  . Asthma    Symbicort as needed 3x week  . Bursitis   . Fibromyalgia   . GERD (gastroesophageal reflux disease)   . Insomnia   . Sciatica of right side   . Vitamin D deficiency     Past Surgical History:  Procedure Laterality Date  . ABDOMINAL HYSTERECTOMY  2000  . Buninonectomy  2008  . [place] SURGERY  2004  .  COLONOSCOPY WITH PROPOFOL N/A 04/25/2012   Procedure: COLONOSCOPY WITH PROPOFOL;  Surgeon: Garlan Fair, MD;  Location: [facility];  Service: Endoscopy;  Laterality: N/A;  . LASIK  2004  . left shoulder surgery    . RESECTION DISTAL CLAVICAL Left 08/24/2014   Procedure: RESECTION DISTAL CLAVICAL;  Surgeon: Melrose Nakayama, MD;  Location: [facility];  Service: Orthopedics;  Laterality: Left;  . SHOULDER ARTHROSCOPY WITH SUBACROMIAL DECOMPRESSION Left 08/24/2014   Procedure: ARTHROSCOPY  LEFT SHOULDER, SUBACROMIAL DECOMPRESSION, DISTAL CLAVICLE RESECTION AND DEBRIDEMENT;  Surgeon: Melrose Nakayama, MD;  Location: [facility];  Service: Orthopedics;  Laterality: Left;  .  Tendernitis Surgery  99    Review of systems negative except as noted in HPI / PMHx or noted below:  Review of Systems  Constitutional: Negative.   HENT: Negative.    Eyes: Negative.   Respiratory: Negative.    Cardiovascular: Negative.   Gastrointestinal: Negative.   Genitourinary: Negative.   Musculoskeletal: Negative.   Skin: Negative.   Neurological: Negative.   Endo/Heme/Allergies: Negative.   Psychiatric/Behavioral: Negative.       Objective:   Vitals:   08/26/21 0932  BP: 130/78  Pulse: 78  Resp: 16  Temp: 98 F (36.7 C)  SpO2: 96%   Height: '5\' 4"'$  (162.6 cm)  Weight: 163 lb 3.2 oz (74 kg)   Physical Exam Constitutional:      Appearance: She is not diaphoretic.  HENT:     Head: Normocephalic.     Right Ear: Tympanic membrane, ear canal and external ear normal.     Left Ear: Tympanic membrane, ear canal and external ear normal.     Nose: Nose normal. No mucosal edema or rhinorrhea.     Mouth/Throat:     Pharynx: Uvula midline. No oropharyngeal exudate.  Eyes:     Conjunctiva/sclera: Conjunctivae normal.  Neck:     Thyroid: No thyromegaly.     Trachea: Trachea normal. No tracheal tenderness or tracheal deviation.  Cardiovascular:     Rate and Rhythm: Normal rate and regular rhythm.     Heart sounds: Normal heart sounds, S1 normal and S2 normal. No murmur heard. Pulmonary:     Effort: No respiratory distress.     Breath sounds: Normal breath sounds. No stridor. No wheezing or rales.  Lymphadenopathy:     Head:     Right side of head: No tonsillar adenopathy.     Left side of head: No tonsillar adenopathy.     Cervical: No cervical adenopathy.  Skin:    Findings: No erythema or rash.     Nails: There is no clubbing.  Neurological:     Mental Status: She is alert.    Diagnostics:    Spirometry was performed and demonstrated an FEV1 of 1.52 at 77 % of predicted.  The patient had an Asthma Control Test with the following results:  .     Assessment and Plan:   No diagnosis found.  Patient Instructions    1. Continue Symbicort 160 mg  2 puffs 2 times a day   2. Continue omeprazole 20 mg 1 time per day  3. Continue the following if needed:  A.  OTC Zyrtec 1 time per day   B.  ProAir HFA 2 puffs every 4-6 hours    C.  Famotidine 20 mg in evening    D.  Nasacort - 1 spray each nostril 1 time per day  4. Obtain fall flu vaccine and RSV  vaccine  5. Return to clinic in 6 months or earlier if problem            Allena Katz, MD Allergy / [place]

## 2021-08-26 NOTE — Patient Instructions (Addendum)
  1. Continue Symbicort 160 mg  2 puffs 2 times a day   2. Continue omeprazole 20 mg 1 time per day  3. Continue the following if needed:  A.  OTC Zyrtec 1 time per day   B.  ProAir HFA 2 puffs every 4-6 hours    C.  Famotidine 20 mg in evening    D.  Nasacort - 1 spray each nostril 1 time per day  4. Obtain fall flu vaccine and RSV vaccine  5. Return to clinic in 6 months or earlier if problem  6. Discuss with orthopedic surgeon about obtaining MRI of lower back

## 2021-08-27 ENCOUNTER — Encounter: Payer: Self-pay | Admitting: Allergy and Immunology

## 2021-09-01 ENCOUNTER — Telehealth: Payer: Self-pay

## 2021-09-01 NOTE — Telephone Encounter (Signed)
-----   Message from Jiles Prows, MD sent at 08/27/2021  6:46 AM EDT ----- Please inform Susan Li that I looked through her MRIs and CT scans of her spine.  It appears that her last imaging of her lower spine was 2008.  She did have a CT scan of her upper spine in 2022.  I think she will need to obtain an MRI of her lower spine to find out what is going on with the pain that is going down her right leg.

## 2021-09-02 NOTE — Telephone Encounter (Signed)
Unable to reach Bailey, main number did not have a voicemail set up and the secondary number was disconnected. Per Dr. Neldon Mc, she needs to contact her ortho to have this MRI done.

## 2021-09-11 DIAGNOSIS — Z23 Encounter for immunization: Secondary | ICD-10-CM | POA: Diagnosis not present

## 2021-09-19 DIAGNOSIS — M47816 Spondylosis without myelopathy or radiculopathy, lumbar region: Secondary | ICD-10-CM | POA: Diagnosis not present

## 2021-10-15 DIAGNOSIS — Z23 Encounter for immunization: Secondary | ICD-10-CM | POA: Diagnosis not present

## 2021-10-22 DIAGNOSIS — M47816 Spondylosis without myelopathy or radiculopathy, lumbar region: Secondary | ICD-10-CM | POA: Diagnosis not present

## 2021-10-22 DIAGNOSIS — M7041 Prepatellar bursitis, right knee: Secondary | ICD-10-CM | POA: Diagnosis not present

## 2021-11-28 DIAGNOSIS — M7041 Prepatellar bursitis, right knee: Secondary | ICD-10-CM | POA: Diagnosis not present

## 2021-11-28 DIAGNOSIS — M25561 Pain in right knee: Secondary | ICD-10-CM | POA: Diagnosis not present

## 2021-11-28 DIAGNOSIS — M47816 Spondylosis without myelopathy or radiculopathy, lumbar region: Secondary | ICD-10-CM | POA: Diagnosis not present

## 2021-12-31 ENCOUNTER — Encounter: Payer: Self-pay | Admitting: Allergy

## 2021-12-31 ENCOUNTER — Ambulatory Visit (INDEPENDENT_AMBULATORY_CARE_PROVIDER_SITE_OTHER): Payer: Medicare Other | Admitting: Allergy

## 2021-12-31 VITALS — BP 128/82 | HR 94 | Temp 98.2°F | Resp 16

## 2021-12-31 DIAGNOSIS — R42 Dizziness and giddiness: Secondary | ICD-10-CM

## 2021-12-31 DIAGNOSIS — Z72821 Inadequate sleep hygiene: Secondary | ICD-10-CM

## 2021-12-31 DIAGNOSIS — K219 Gastro-esophageal reflux disease without esophagitis: Secondary | ICD-10-CM

## 2021-12-31 DIAGNOSIS — J3089 Other allergic rhinitis: Secondary | ICD-10-CM | POA: Diagnosis not present

## 2021-12-31 DIAGNOSIS — J4540 Moderate persistent asthma, uncomplicated: Secondary | ICD-10-CM | POA: Diagnosis not present

## 2021-12-31 NOTE — Patient Instructions (Addendum)
  1. Continue Symbicort 160 mg  2 puffs 2 times a day   2. Continue omeprazole 20 mg 1 time per day  3.  Resume Zyrtec '10mg'$  daily at this time  4. Use Nasacort 1 spray each nostril twice a day (AM and PM dose) to help with congestion control  3. Continue the following if needed:  A.  ProAir HFA 2 puffs every 4-6 hours    B.  Famotidine 20 mg in evening    4. Return to clinic in 6 months or earlier if problem

## 2021-12-31 NOTE — Progress Notes (Signed)
Follow-up Note  RE: Susan Li MRN: [id number] DOB: Mar 06, 1940 Date of Office Visit: 12/31/2021   History of present illness: Susan Li is a 81 y.o. female presenting today for follow-up of asthma, allergic rhinitis, LPRD.  She was last seen in the office on 08/26/21 by Dr. Neldon Mc.   When she wakes up in the mornings she states she is "jammed up" and congestion and her voice is very raspy.  Once she is up and moving about and after having her coffee she does note some improvement in the congestion and in her voice.  She states even at this visit her voice is still nasally soudning.  She takes nasal spray in AM 1 spray each nostril; this is her nasal steroid spray.  She stopped zyrtec a while ago but does not recall exactly when or why.  She states she will take alka seltzer at bedtime to help with the congestion.   She states she has had some issues with dizziness and also reports not being to get to sleep well.  Thus she has been taking zquil at night but this gets her about 3 hours of sleep. Sometimes she will then take melatonin.  She is using a walker to help with her stability since she has been having some dizziness.  She states she will be doing to her doctors in February and plans to address this with them.   She states her asthma has been quite controlled and she doesn't miss taking her Symbicort 2 puffs twice a day.  She states she has rare use of albuterol if nay.  No UC/ED visits or systemic steroids since last visit for her asthma.     Review of systems: Review of Systems  Constitutional: Negative.   HENT:         See HPI  Eyes: Negative.   Respiratory: Negative.    Cardiovascular: Negative.   Gastrointestinal: Negative.   Musculoskeletal: Negative.   Skin: Negative.   Allergic/Immunologic: Negative.   Neurological:        See HPI     All other systems negative unless noted above in HPI  Past medical/social/surgical/family history have been reviewed and are  unchanged unless specifically indicated below.  No changes  Medication List: Current Outpatient Medications  Medication Sig Dispense Refill   Ascorbic Acid (VITAMIN C WITH ROSE HIPS) 500 MG tablet Take 500 mg by mouth daily.     BIOTIN PO Take 5,000 mg by mouth daily.      budesonide-formoterol (SYMBICORT) 160-4.5 MCG/ACT inhaler Inhale 2 puffs into the lungs 2 (two) times daily. USE 2 PUFFS TWICE DAILY. 30.6 g 1   Calcium Citrate-Vitamin D (CALCIUM + D PO) Take 1,200 mg by mouth daily.     cholecalciferol (VITAMIN D3) 25 MCG (1000 UNIT) tablet Take 1 tablet by mouth daily.     estradiol (ESTRACE) 1 MG tablet Take 0.5 mg by mouth daily.      melatonin 1 MG TABS tablet 2 tablet at bedtime as needed     omeprazole (PRILOSEC) 20 MG capsule Take 1 capsule (20 mg total) by mouth in the morning. 90 capsule 1   VENTOLIN HFA 108 (90 Base) MCG/ACT inhaler Inhale 2 puffs into the lungs every 4 (four) hours as needed for wheezing or shortness of breath. 18 g 1   cetirizine (ZYRTEC) 10 MG tablet Take 1 tablet (10 mg total) by mouth daily as needed for allergies (Can take an extra dose during flare  ups.). (Patient not taking: Reported on 12/31/2021) 60 tablet 5   famotidine (PEPCID) 20 MG tablet Take 1 tablet (20 mg total) by mouth at bedtime. Take one tablet by mouth daily at bedtime (Patient not taking: Reported on 12/31/2021) 30 tablet 5   triamcinolone (NASACORT) 55 MCG/ACT AERO nasal inhaler Place 1 spray into the nose daily. (Patient not taking: Reported on 12/31/2021) 17 g 5   No current facility-administered medications for this visit.     Known medication allergies: Allergies  Allergen Reactions   Morphine And Related Itching    Itching nose   Morphine Sulfate Other (See Comments)    Other reaction(s): itchy nose   Sertraline Hcl Other (See Comments)    Other reaction(s): diarrhea   Zolpidem Tartrate     Other reaction(s): nocturnal syncope     Physical examination: Blood pressure  128/82, pulse 94, temperature 98.2 F (36.8 C), temperature source Temporal, resp. rate 16, SpO2 96 %.  General: Alert, interactive, in no acute distress. HEENT: PERRLA, TMs pearly gray, turbinates moderately edematous without discharge, post-pharynx non erythematous. Neck: Supple without lymphadenopathy. Lungs: Clear to auscultation without wheezing, rhonchi or rales. {no increased work of breathing. CV: Normal S1, S2 without murmurs. Abdomen: Nondistended, nontender. Skin: Warm and dry, without lesions or rashes. Extremities:  No clubbing, cyanosis or edema. Neuro:   Grossly intact.  Diagnositics/Labs: None today  Assessment and plan: Allergic rhintis (increased congestion) - increasing current regimen with antihistamine and nasal steroid use.   Asthma, mod persistent - under good control LPRD Dizziness - hopeful with better congestion control this will improve dizziness.  She will be discussing this with her PCP Poor sleep - discuss with PCP however discouraged continued zquil use.  [place] for melatonin to help assist with sleep initiation    1. Continue Symbicort 160 mg  2 puffs 2 times a day   2. Continue omeprazole 20 mg 1 time per day  3.  Resume Zyrtec '10mg'$  daily at this time  4. Use Nasacort 1 spray each nostril twice a day (AM and PM dose) to help with congestion control  3. Continue the following if needed:  A.  ProAir HFA 2 puffs every 4-6 hours    B.  Famotidine 20 mg in evening    4. Return to clinic in 6 months or earlier if problem    I appreciate the opportunity to take part in [facility] care. Please do not hesitate to contact me with questions.  Sincerely,   Prudy Feeler, MD Allergy/Immunology [facility]

## 2022-03-02 DIAGNOSIS — F322 Major depressive disorder, single episode, severe without psychotic features: Secondary | ICD-10-CM | POA: Diagnosis not present

## 2022-03-02 DIAGNOSIS — R2681 Unsteadiness on feet: Secondary | ICD-10-CM | POA: Diagnosis not present

## 2022-03-02 DIAGNOSIS — M545 Low back pain, unspecified: Secondary | ICD-10-CM | POA: Diagnosis not present

## 2022-03-02 DIAGNOSIS — Z789 Other specified health status: Secondary | ICD-10-CM | POA: Diagnosis not present

## 2022-03-02 DIAGNOSIS — Z23 Encounter for immunization: Secondary | ICD-10-CM | POA: Diagnosis not present

## 2022-03-02 DIAGNOSIS — Z79890 Hormone replacement therapy (postmenopausal): Secondary | ICD-10-CM | POA: Diagnosis not present

## 2022-03-02 DIAGNOSIS — G8929 Other chronic pain: Secondary | ICD-10-CM | POA: Diagnosis not present

## 2022-03-02 DIAGNOSIS — Z79899 Other long term (current) drug therapy: Secondary | ICD-10-CM | POA: Diagnosis not present

## 2022-03-02 DIAGNOSIS — J45909 Unspecified asthma, uncomplicated: Secondary | ICD-10-CM | POA: Diagnosis not present

## 2022-03-02 DIAGNOSIS — E559 Vitamin D deficiency, unspecified: Secondary | ICD-10-CM | POA: Diagnosis not present

## 2022-03-02 DIAGNOSIS — E781 Pure hyperglyceridemia: Secondary | ICD-10-CM | POA: Diagnosis not present

## 2022-03-02 DIAGNOSIS — F5101 Primary insomnia: Secondary | ICD-10-CM | POA: Diagnosis not present

## 2022-03-02 DIAGNOSIS — Z0000 Encounter for general adult medical examination without abnormal findings: Secondary | ICD-10-CM | POA: Diagnosis not present

## 2022-03-07 ENCOUNTER — Other Ambulatory Visit: Payer: Self-pay | Admitting: Allergy & Immunology

## 2022-03-10 ENCOUNTER — Encounter: Payer: Self-pay | Admitting: Allergy and Immunology

## 2022-03-10 ENCOUNTER — Ambulatory Visit (INDEPENDENT_AMBULATORY_CARE_PROVIDER_SITE_OTHER): Payer: Medicare Other | Admitting: Allergy and Immunology

## 2022-03-10 ENCOUNTER — Other Ambulatory Visit: Payer: Self-pay

## 2022-03-10 VITALS — BP 118/72 | HR 96 | Temp 97.9°F | Resp 18 | Ht 64.0 in | Wt 164.3 lb

## 2022-03-10 DIAGNOSIS — K219 Gastro-esophageal reflux disease without esophagitis: Secondary | ICD-10-CM

## 2022-03-10 DIAGNOSIS — J4540 Moderate persistent asthma, uncomplicated: Secondary | ICD-10-CM

## 2022-03-10 DIAGNOSIS — J3089 Other allergic rhinitis: Secondary | ICD-10-CM

## 2022-03-10 MED ORDER — OMEPRAZOLE 20 MG PO CPDR: 20.0000 mg | DELAYED_RELEASE_CAPSULE | Freq: Every morning | ORAL | 1 refills | Status: DC

## 2022-03-10 MED ORDER — ALBUTEROL SULFATE HFA 108 (90 BASE) MCG/ACT IN AERS: 2.0000 | INHALATION_SPRAY | RESPIRATORY_TRACT | 1 refills | Status: DC | PRN

## 2022-03-10 MED ORDER — TRIAMCINOLONE ACETONIDE 55 MCG/ACT NA AERO: 1.0000 | INHALATION_SPRAY | Freq: Every day | NASAL | 5 refills | Status: DC

## 2022-03-10 MED ORDER — CETIRIZINE HCL 10 MG PO TABS: 10.0000 mg | ORAL_TABLET | Freq: Every day | ORAL | 5 refills | Status: DC | PRN

## 2022-03-10 MED ORDER — BUDESONIDE-FORMOTEROL FUMARATE 160-4.5 MCG/ACT IN AERO: 2.0000 | INHALATION_SPRAY | Freq: Two times a day (BID) | RESPIRATORY_TRACT | 1 refills | Status: DC

## 2022-03-10 MED ORDER — FAMOTIDINE 20 MG PO TABS: 20.0000 mg | ORAL_TABLET | Freq: Every evening | ORAL | 5 refills | Status: DC

## 2022-03-10 NOTE — Progress Notes (Unsigned)
[place] [facility] [place] [facility] [place]   Follow-up Note  Referring Provider: Charlane Ferretti, MD Primary Provider: Charlane Ferretti, MD Date of Office Visit: 03/10/2022  Subjective:   Susan Li (DOB: [birth year]) is a 82 y.o. female who returns to the [facility] on 03/10/2022 in re-evaluation of the following:  HPI: Susan Li returns to this clinic in evaluation of asthma, allergic rhinitis, LPR.  I last saw her in this clinic 26 August 2021.  She did visit with Dr. Nelva Bush on 31 December 2021 for an issue tied up with rhinitis.  Overall she appears to be doing quite well regarding her airway issue.  She continues to use Symbicort on a consistent basis and continues on Nasacort on a pretty consistent basis and this has resulted in good control of all of her airway issues and she rarely uses a short acting bronchodilator.  She does not really exercise to any significant degree because of a back issue and the fact that she has had a balance issue recently.  She is now using a walker to ambulate.  She has not required a systemic steroid or an antibiotic for any type of airway issue.  Her reflux is under good control with the use of omeprazole.  Occasionally she will use famotidine for some regurgitation and burning.  She still continues to drink about 2 glasses of wine at nighttime but she drinks them relatively early around 6 PM.  She has received the flu vaccine, RSV vaccine, COVID-vaccine, and a pneumonia vaccine.  Allergies as of 03/10/2022       Reactions   Morphine And Related Itching   Itching nose   Morphine Sulfate Other (See Comments)   Other reaction(s): itchy nose   Sertraline Hcl Other (See Comments)   Other reaction(s): diarrhea   Zolpidem Tartrate    Other reaction(s): nocturnal syncope        Medication List    Belsomra 10 MG Tabs Generic drug: Suvorexant Take 1 tablet by mouth at bedtime as needed.   BIOTIN PO Take 5,000 mg by  mouth daily.   budesonide-formoterol 160-4.5 MCG/ACT inhaler Commonly known as: SYMBICORT Inhale 2 puffs into the lungs 2 (two) times daily. USE 2 PUFFS TWICE DAILY.   CALCIUM + D PO Take 1,200 mg by mouth daily.   cetirizine 10 MG tablet Commonly known as: ZYRTEC Take 1 tablet (10 mg total) by mouth daily as needed for allergies (Can take an extra dose during flare ups.).   cholecalciferol 25 MCG (1000 UNIT) tablet Commonly known as: VITAMIN D3 Take 1 tablet by mouth daily.   estradiol 1 MG tablet Commonly known as: ESTRACE Take 0.5 mg by mouth daily.   famotidine 20 MG tablet Commonly known as: Pepcid Take 1 tablet (20 mg total) by mouth at bedtime. Take one tablet by mouth daily at bedtime   melatonin 1 MG Tabs tablet 2 tablet at bedtime as needed   omeprazole 20 MG capsule Commonly known as: PRILOSEC Take 1 capsule (20 mg total) by mouth in the morning.   oxybutynin 5 MG tablet Commonly known as: DITROPAN Take 5 mg by mouth 3 (three) times daily.   triamcinolone 55 MCG/ACT Aero nasal inhaler Commonly known as: NASACORT Place 1 spray into the nose daily.   Ventolin HFA 108 (90 Base) MCG/ACT inhaler Generic drug: albuterol Inhale 2 puffs into the lungs every 4 (four) hours as needed for wheezing or shortness of breath.  vitamin C with rose hips 500 MG tablet Take 500 mg by mouth daily.    Past Medical History:  Diagnosis Date   Arthritis    hips,backs,hands; osteoarthritis   Asthma    Symbicort as needed 3x week   Bursitis    Fibromyalgia    GERD (gastroesophageal reflux disease)    Insomnia    Sciatica of right side    Vitamin D deficiency     Past Surgical History:  Procedure Laterality Date   ABDOMINAL HYSTERECTOMY  2000   Buninonectomy  2008   CERVICAL [place] SURGERY  2004   COLONOSCOPY WITH PROPOFOL N/A 04/25/2012   Procedure: COLONOSCOPY WITH PROPOFOL;  Surgeon: Garlan Fair, MD;  Location: [facility];  Service: Endoscopy;  Laterality:  N/A;   LASIK  2004   left shoulder surgery     RESECTION DISTAL CLAVICAL Left 08/24/2014   Procedure: RESECTION DISTAL CLAVICAL;  Surgeon: Melrose Nakayama, MD;  Location: [facility];  Service: Orthopedics;  Laterality: Left;   SHOULDER ARTHROSCOPY WITH SUBACROMIAL DECOMPRESSION Left 08/24/2014   Procedure: ARTHROSCOPY  LEFT SHOULDER, SUBACROMIAL DECOMPRESSION, DISTAL CLAVICLE RESECTION AND DEBRIDEMENT;  Surgeon: Melrose Nakayama, MD;  Location: [facility];  Service: Orthopedics;  Laterality: Left;   Tendernitis Surgery  99    Review of systems negative except as noted in HPI / PMHx or noted below:  Review of Systems  Constitutional: Negative.   HENT: Negative.    Eyes: Negative.   Respiratory: Negative.    Cardiovascular: Negative.   Gastrointestinal: Negative.   Genitourinary: Negative.   Musculoskeletal: Negative.   Skin: Negative.   Neurological: Negative.   Endo/Heme/Allergies: Negative.   Psychiatric/Behavioral: Negative.       Objective:   Vitals:   03/10/22 1024  BP: 118/72  Pulse: 96  Resp: 18  Temp: 97.9 F (36.6 C)  SpO2: 96%   Height: '5\' 4"'$  (162.6 cm)  Weight: 164 lb 4.8 oz (74.5 kg)   Physical Exam Constitutional:      Appearance: She is not diaphoretic.  HENT:     Head: Normocephalic.     Right Ear: External ear normal.     Left Ear: External ear normal.     Ears:     Comments: Bilateral here and aids    Nose: Nose normal. No mucosal edema or rhinorrhea.     Mouth/Throat:     Pharynx: Uvula midline. No oropharyngeal exudate.  Eyes:     Conjunctiva/sclera: Conjunctivae normal.  Neck:     Thyroid: No thyromegaly.     Trachea: Trachea normal. No tracheal tenderness or tracheal deviation.  Cardiovascular:     Rate and Rhythm: Normal rate and regular rhythm.     Heart sounds: Normal heart sounds, S1 normal and S2 normal. No murmur heard. Pulmonary:     Effort: No respiratory distress.     Breath sounds: Normal breath  sounds. No stridor. No wheezing or rales.  Lymphadenopathy:     Head:     Right side of head: No tonsillar adenopathy.     Left side of head: No tonsillar adenopathy.     Cervical: No cervical adenopathy.  Skin:    Findings: No erythema or rash.     Nails: There is no clubbing.  Neurological:     Mental Status: She is alert.     Diagnostics:    Spirometry was performed and demonstrated an FEV1 of 1.38 at 72 % of predicted.  The patient had an Asthma  Control Test with the following results: ACT Total Score: 23.    Assessment and Plan:   1. Asthma, moderate persistent, well-controlled   2. Other allergic rhinitis   3. LPRD (laryngopharyngeal reflux disease)      1. Continue Symbicort 160 mg  2 puffs 2 times a day   2. Continue omeprazole 20 mg 1 time per day  3. Continue the following if needed:  A.  OTC Zyrtec 1 time per day   B.  ProAir HFA 2 puffs every 4-6 hours    C.  Famotidine 20 mg in evening    D.  Nasacort - 1 spray each nostril 1-2 time per day  4. Return to clinic in 6 months or earlier if problem  Josceline appears to be doing pretty well with therapy directed against inflammation of her airway and therapy directed against reflux induced respiratory disease with the current plan noted above.  If she continues to do this well I will see her back in this clinic in 6 months or earlier if there is a problem.  Allena Katz, MD Allergy / Immunology [facility]

## 2022-03-10 NOTE — Patient Instructions (Addendum)
  1. Continue Symbicort 160 mg  2 puffs 2 times a day   2. Continue omeprazole 20 mg 1 time per day  3. Continue the following if needed:  A.  OTC Zyrtec 1 time per day   B.  ProAir HFA 2 puffs every 4-6 hours    C.  Famotidine 20 mg in evening    D.  Nasacort - 1 spray each nostril 1-2 time per day  4. Return to clinic in 6 months or earlier if problem

## 2022-03-11 ENCOUNTER — Encounter: Payer: Self-pay | Admitting: Allergy and Immunology

## 2022-03-26 DIAGNOSIS — Z1231 Encounter for screening mammogram for malignant neoplasm of breast: Secondary | ICD-10-CM | POA: Diagnosis not present

## 2022-08-17 DIAGNOSIS — Z23 Encounter for immunization: Secondary | ICD-10-CM | POA: Diagnosis not present

## 2022-08-31 DIAGNOSIS — J45909 Unspecified asthma, uncomplicated: Secondary | ICD-10-CM | POA: Diagnosis not present

## 2022-08-31 DIAGNOSIS — F322 Major depressive disorder, single episode, severe without psychotic features: Secondary | ICD-10-CM | POA: Diagnosis not present

## 2022-08-31 DIAGNOSIS — F5101 Primary insomnia: Secondary | ICD-10-CM | POA: Diagnosis not present

## 2022-08-31 DIAGNOSIS — R2681 Unsteadiness on feet: Secondary | ICD-10-CM | POA: Diagnosis not present

## 2022-08-31 DIAGNOSIS — Z79890 Hormone replacement therapy (postmenopausal): Secondary | ICD-10-CM | POA: Diagnosis not present

## 2022-08-31 DIAGNOSIS — Z23 Encounter for immunization: Secondary | ICD-10-CM | POA: Diagnosis not present

## 2022-09-01 ENCOUNTER — Ambulatory Visit: Payer: Medicare Other | Admitting: Allergy and Immunology

## 2022-09-01 ENCOUNTER — Other Ambulatory Visit: Payer: Self-pay

## 2022-09-01 ENCOUNTER — Encounter: Payer: Self-pay | Admitting: Allergy and Immunology

## 2022-09-01 ENCOUNTER — Telehealth: Payer: Self-pay | Admitting: *Deleted

## 2022-09-01 ENCOUNTER — Telehealth: Payer: Self-pay

## 2022-09-01 VITALS — BP 128/78 | HR 91 | Temp 97.4°F | Resp 18 | Ht 64.0 in | Wt 164.4 lb

## 2022-09-01 DIAGNOSIS — H04123 Dry eye syndrome of bilateral lacrimal glands: Secondary | ICD-10-CM

## 2022-09-01 DIAGNOSIS — R42 Dizziness and giddiness: Secondary | ICD-10-CM

## 2022-09-01 DIAGNOSIS — K219 Gastro-esophageal reflux disease without esophagitis: Secondary | ICD-10-CM

## 2022-09-01 DIAGNOSIS — J3089 Other allergic rhinitis: Secondary | ICD-10-CM

## 2022-09-01 DIAGNOSIS — J4540 Moderate persistent asthma, uncomplicated: Secondary | ICD-10-CM | POA: Diagnosis not present

## 2022-09-01 DIAGNOSIS — R2681 Unsteadiness on feet: Secondary | ICD-10-CM | POA: Diagnosis not present

## 2022-09-01 MED ORDER — OMEPRAZOLE 20 MG PO CPDR: 20.0000 mg | DELAYED_RELEASE_CAPSULE | Freq: Every morning | ORAL | 1 refills | Status: DC

## 2022-09-01 MED ORDER — BUDESONIDE-FORMOTEROL FUMARATE 160-4.5 MCG/ACT IN AERO: 2.0000 | INHALATION_SPRAY | Freq: Two times a day (BID) | RESPIRATORY_TRACT | 1 refills | Status: DC

## 2022-09-01 MED ORDER — FAMOTIDINE 20 MG PO TABS: ORAL_TABLET | ORAL | 1 refills | Status: DC

## 2022-09-01 MED ORDER — ALBUTEROL SULFATE HFA 108 (90 BASE) MCG/ACT IN AERS: 2.0000 | INHALATION_SPRAY | RESPIRATORY_TRACT | 1 refills | Status: DC | PRN

## 2022-09-01 NOTE — Progress Notes (Unsigned)
[place] [facility] [place] [facility] [place] [facility] [place] [facility] [place]   Follow-up Note  Referring Provider: Thana Ates, MD Primary Provider: Thana Ates, MD Date of Office Visit: 09/01/2022  Subjective:   Susan Li (DOB: May 13, 1940) is a 82 y.o. female [facility] in re-evaluation of the following:  HPI: Susan Li returns to this clinic in evaluation of asthma, allergic rhinitis, LPR.  I last saw her in this clinic 05 March 2022.  She is doing very well from a respiratory standpoint and has very little issues with coughing or wheezing or nasal congestion while she uses her Symbicort twice a day and rarely uses any nasal steroids.  She has not required a systemic steroid or an antibiotic for any type of airway issue.  She does not really exercise.  She believes that her reflux is under very good control while using omeprazole.  She has not been using any famotidine.  She has been having problems with balance.  She is now using a walker because she just feels unsteady.  If she stands up straight without a walker she can fell herself swaying.  She does have a history of tinnitus and some hearing loss but she has never really had frank vertigo.  She takes a Benadryl at nighttime to sleep and sometimes follows that up with the melatonin.  She has been having problems with waking up in the morning and her eyes are extremely dry and she has to pull apart her eyelids.  Then after she does so she has a lot of water comes out over her eye and onto her face.  Allergies as of 09/01/2022       Reactions   Morphine And Codeine Itching   Itching nose   Morphine Sulfate Other (See Comments)   Other reaction(s): itchy nose   Sertraline Hcl Other (See Comments)   Other reaction(s): diarrhea   Zolpidem Tartrate    Other reaction(s): nocturnal syncope        Medication List    albuterol 108 (90 Base) MCG/ACT inhaler Commonly known as: Ventolin  HFA Inhale 2 puffs into the lungs every 4 (four) hours as needed for wheezing or shortness of breath.   BIOTIN PO Take 5,000 mg by mouth daily.   budesonide-formoterol 160-4.5 MCG/ACT inhaler Commonly known as: SYMBICORT Inhale 2 puffs into the lungs 2 (two) times daily. USE 2 PUFFS TWICE DAILY.   CALCIUM + D PO Take 1,200 mg by mouth daily.   cetirizine 10 MG tablet Commonly known as: ZYRTEC Take 1 tablet (10 mg total) by mouth daily as needed for allergies (Can take an extra dose during flare ups.).   cholecalciferol 25 MCG (1000 UNIT) tablet Commonly known as: VITAMIN D3 Take 1 tablet by mouth daily.   estradiol 1 MG tablet Commonly known as: ESTRACE Take 0.5 mg by mouth daily.   famotidine 20 MG tablet Commonly known as: Pepcid Take 1 tablet (20 mg total) by mouth at bedtime. Take one tablet by mouth daily at bedtime   melatonin 1 MG Tabs tablet 2 tablet at bedtime as needed   omeprazole 20 MG capsule Commonly known as: PRILOSEC Take 1 capsule (20 mg total) by mouth in the morning.   triamcinolone 55 MCG/ACT Aero nasal inhaler Commonly known as: NASACORT Place 1 spray into the nose daily.   vitamin C with rose hips 500 MG tablet Take 500 mg by mouth daily.  Past Medical History:  Diagnosis Date   Arthritis    hips,backs,hands; osteoarthritis   Asthma    Symbicort as needed 3x week   Bursitis    Fibromyalgia    GERD (gastroesophageal reflux disease)    Insomnia    Sciatica of right side    Vitamin D deficiency     Past Surgical History:  Procedure Laterality Date   ABDOMINAL HYSTERECTOMY  2000   Buninonectomy  2008   CERVICAL [place] SURGERY  2004   COLONOSCOPY WITH PROPOFOL N/A 04/25/2012   Procedure: COLONOSCOPY WITH PROPOFOL;  Surgeon: Charolett Bumpers, MD;  Location: [facility];  Service: Endoscopy;  Laterality: N/A;   LASIK  2004   left shoulder surgery     RESECTION DISTAL CLAVICAL Left 08/24/2014   Procedure: RESECTION DISTAL CLAVICAL;   Surgeon: Marcene Corning, MD;  Location: [facility];  Service: Orthopedics;  Laterality: Left;   SHOULDER ARTHROSCOPY WITH SUBACROMIAL DECOMPRESSION Left 08/24/2014   Procedure: ARTHROSCOPY  LEFT SHOULDER, SUBACROMIAL DECOMPRESSION, DISTAL CLAVICLE RESECTION AND DEBRIDEMENT;  Surgeon: Marcene Corning, MD;  Location: [facility];  Service: Orthopedics;  Laterality: Left;   Tendernitis Surgery  99    Review of systems negative except as noted in HPI / PMHx or noted below:  Review of Systems  Constitutional: Negative.   HENT: Negative.    Eyes: Negative.   Respiratory: Negative.    Cardiovascular: Negative.   Gastrointestinal: Negative.   Genitourinary: Negative.   Musculoskeletal: Negative.   Skin: Negative.   Neurological: Negative.   Endo/Heme/Allergies: Negative.   Psychiatric/Behavioral: Negative.       Objective:   Vitals:   09/01/22 1339  BP: 128/78  Pulse: 91  Resp: 18  Temp: (!) 97.4 F (36.3 C)  SpO2: 96%   Height: 5\' 4"  (162.6 cm)  Weight: 164 lb 6.4 oz (74.6 kg)   Physical Exam Constitutional:      Appearance: She is not diaphoretic.     Comments: Walker  HENT:     Head: Normocephalic.     Right Ear: Tympanic membrane, ear canal and external ear normal.     Left Ear: Tympanic membrane, ear canal and external ear normal.     Nose: Nose normal. No mucosal edema or rhinorrhea.     Mouth/Throat:     Pharynx: Uvula midline. No oropharyngeal exudate.  Eyes:     Conjunctiva/sclera: Conjunctivae normal.  Neck:     Thyroid: No thyromegaly.     Trachea: Trachea normal. No tracheal tenderness or tracheal deviation.  Cardiovascular:     Rate and Rhythm: Normal rate and regular rhythm.     Heart sounds: Normal heart sounds, S1 normal and S2 normal. No murmur heard. Pulmonary:     Effort: No respiratory distress.     Breath sounds: Normal breath sounds. No stridor. No wheezing or rales.  Lymphadenopathy:     Head:     Right side of  head: No tonsillar adenopathy.     Left side of head: No tonsillar adenopathy.     Cervical: No cervical adenopathy.  Skin:    Findings: No erythema or rash.     Nails: There is no clubbing.  Neurological:     Mental Status: She is alert.     Diagnostics: Spirometry was performed and demonstrated an FEV1 of 1.44 at 75 % of predicted.  Assessment and Plan:   1. Asthma, moderate persistent, well-controlled   2. Other allergic rhinitis   3. LPRD (laryngopharyngeal  reflux disease)   4. Dizziness   5. Unsteadiness   6. Dry eye syndrome of both eyes     1. Continue Symbicort 160 mg  2 puffs 2 times a day   2. Continue omeprazole 20 mg 1 time per day  3. Continue the following if needed:  A.  OTC Zyrtec 1 time per day   B.  Albuterol 2 puffs every 4-6 hours    C.  Famotidine 20 mg in evening    D.  Nasacort - 1 spray each nostril 1-2 time per day  4. Treat dry eye:   A. Systane gel drop before bed  B. Systane drop upon awaking and throughout the day  C. Visit with ophthalmologist  D. Benadryl use at night???  5. Evaluation of unsteadiness with Neurology  6. Return to clinic in 6 months or earlier if problem  7. Plan for fall flu vaccine  Etta appears to be doing well from a respiratory standpoint and she has a good understanding of her disease state and how her medications work and appropriate dosing of her medications depending on disease activity and her reflux is also under very good control on her current plan.  She has developed unsteadiness and we will refer her onto neurology for this issue.  She has developed dry eye and I have given her some suggestions about what she can do for this condition and I have asked her to visit with an ophthalmologist.  She may need to discontinue her Benadryl use at nighttime as this may be drying out her eye.    Laurette Schimke, MD Allergy / Immunology [facility]

## 2022-09-01 NOTE — Telephone Encounter (Signed)
Per provider:  Neurology referral for unsteadiness

## 2022-09-01 NOTE — Telephone Encounter (Signed)
-----   Message from ERIC J KOZLOW sent at 09/01/2022  2:27 PM EDT ----- Please let Susan Li know that the Benadryl may also be drying out her eyes at nighttime as that is definitely a cause for dry eye on occasion.

## 2022-09-01 NOTE — Telephone Encounter (Signed)
Called and left a voicemail asking for return call to discuss.  

## 2022-09-01 NOTE — Patient Instructions (Addendum)
  1. Continue Symbicort 160 mg  2 puffs 2 times a day   2. Continue omeprazole 20 mg 1 time per day  3. Continue the following if needed:  A.  OTC Zyrtec 1 time per day   B.  Albuterol 2 puffs every 4-6 hours    C.  Famotidine 20 mg in evening    D.  Nasacort - 1 spray each nostril 1-2 time per day  4. Treat dry eye:   A. Systane gel drop before bed  B. Systane drop upon awaking and throughout the day  C. Visit with ophthalmologist  D. Benadryl use at night???  5. Evaluation of unsteadiness with Neurology  6. Return to clinic in 6 months or earlier if problem  7. Plan for fall flu vaccine

## 2022-09-02 ENCOUNTER — Encounter: Payer: Self-pay | Admitting: Allergy and Immunology

## 2022-09-02 NOTE — Telephone Encounter (Signed)
Patient called and spoke with Joni Reining and stated that she only wanted to speak to me but hung up before Joni Reining could transfer her to me. I called back and left a voicemail asking for patient to return call to discuss. Attempted to call home phone but it just rings continuously.

## 2022-09-02 NOTE — Telephone Encounter (Signed)
Called patient and informed of Dr. Kathyrn Lass message. Patient verbalized understanding. She inquired about the Neurology referral, I did inform her that a message was sent to our [facility] and that they will reach out to her with further details once the referral is placed and scheduled. Patient verbalized understanding.

## 2022-09-03 ENCOUNTER — Encounter: Payer: Self-pay | Admitting: Neurology

## 2022-09-03 NOTE — Telephone Encounter (Signed)
Patient is scheduled to see Dr. Jacquelyne Balint on 09/10/2022 @ 11:00 A.M.   [facility] [address], [address] [place],  [place]  [address] Main: [phone]  Patient has been informed of this information & plans to attend this visit.   Thanks

## 2022-09-03 NOTE — Progress Notes (Deleted)
Initial neurology clinic note  Reason for Evaluation: Consultation requested by Jessica Priest, MD for an opinion regarding imbalance. My final recommendations will be communicated back to the requesting physician by way of shared medical record or letter to requesting physician via [place] mail.  HPI: This is Ms. Susan Li, a 82 y.o. ***-handed female with a medical history of cervical radiculopathy, reflux, asthma, fibromyalgia, OA*** who presents to neurology clinic with the chief complaint of ***. The patient is accompanied by ***.  ***  Patient saw allergy on 09/01/22 and mentioned imbalance, now needing a walker. Patient has a history of tinnitus and hearing loss, but denies vertigo/dizziness.***  Patient has previously been seen by Dr. Lucia Gaskins in neurology at [facility], most recently on 06/15/2018 for paresthesias of left arm and cervical radiculopathy. There is mention in the notes of the patient being concerned about ALS as her husband died of ALS. Her EMG was normal though.***  The patient has not*** had similar episodes of symptoms in the past. ***  Muscle bulk loss? *** Muscle pain? ***  Cramps/Twitching? *** Suggestion of myotonia/difficulty relaxing after contraction? ***  Fatigable weakness?*** Does strength improve after brief exercise?***  Able to brush hair/teeth without difficulty? *** Able to button shirts/use zips? *** Clumsiness/dropping grasped objects?*** Can you arise from squatted position easily? *** Able to get out of chair without using arms? *** Able to walk up steps easily? *** Use an assistive device to walk? *** Significant imbalance with walking? *** Falls?*** Any change in urine color, especially after exertion/physical activity? ***  The patient denies*** symptoms suggestive of oculobulbar weakness including diplopia, ptosis, dysphagia, poor saliva control, dysarthria/dysphonia, impaired mastication, facial weakness/droop.  There are no*** neuromuscular  respiratory weakness symptoms, particularly orthopnea>dyspnea.   Pseudobulbar affect is absent***.  The patient does not*** report symptoms referable to autonomic dysfunction including impaired sweating, heat or cold intolerance, excessive mucosal dryness, gastroparetic early satiety, postprandial abdominal bloating, constipation, bowel or bladder dyscontrol, erectile dysfunction*** or syncope/presyncope/orthostatic intolerance.  There are no*** complaints relating to other symptoms of small fiber modalities including paresthesia/pain.  The patient has not *** noticed any recent skin rashes nor does he*** report any constitutional symptoms like fever, night sweats, anorexia or unintentional weight loss.  EtOH use: ***  Restrictive diet? *** Family history of neuropathy/myopathy/NM disease?***  Previous labs, electrodiagnostics, and neuroimaging are summarized below, but pertinent findings include***  Any biopsy done? *** Current medications being tried for the patient's symptoms include ***  Prior medications that have been tried: ***   MEDICATIONS:  Outpatient Encounter Medications as of 09/10/2022  Medication Sig   albuterol (VENTOLIN HFA) 108 (90 Base) MCG/ACT inhaler Inhale 2 puffs into the lungs every 4 (four) hours as needed for wheezing or shortness of breath.   Ascorbic Acid (VITAMIN C WITH ROSE HIPS) 500 MG tablet Take 500 mg by mouth daily.   BELSOMRA 10 MG TABS Take 1 tablet by mouth at bedtime as needed.   BIOTIN PO Take 5,000 mg by mouth daily.    budesonide-formoterol (SYMBICORT) 160-4.5 MCG/ACT inhaler Inhale 2 puffs into the lungs 2 (two) times daily.   Calcium Citrate-Vitamin D (CALCIUM + D PO) Take 1,200 mg by mouth daily.   cetirizine (ZYRTEC) 10 MG tablet Take 1 tablet (10 mg total) by mouth daily as needed for allergies (Can take an extra dose during flare ups.).   cholecalciferol (VITAMIN D3) 25 MCG (1000 UNIT) tablet Take 1 tablet by mouth daily.   estradiol  (  ESTRACE) 1 MG tablet Take 0.5 mg by mouth daily.    famotidine (PEPCID) 20 MG tablet 1 tablet by mouth in the evening if needed.   melatonin 1 MG TABS tablet 2 tablet at bedtime as needed   omeprazole (PRILOSEC) 20 MG capsule Take 1 capsule (20 mg total) by mouth in the morning.   oxybutynin (DITROPAN) 5 MG tablet Take 5 mg by mouth 3 (three) times daily.   triamcinolone (NASACORT) 55 MCG/ACT AERO nasal inhaler Place 1 spray into the nose daily.   No facility-administered encounter medications on file as of 09/10/2022.    PAST MEDICAL HISTORY: Past Medical History:  Diagnosis Date   Arthritis    hips,backs,hands; osteoarthritis   Asthma    Symbicort as needed 3x week   Bursitis    Fibromyalgia    GERD (gastroesophageal reflux disease)    Insomnia    Sciatica of right side    Vitamin D deficiency     PAST SURGICAL HISTORY: Past Surgical History:  Procedure Laterality Date   ABDOMINAL HYSTERECTOMY  2000   Buninonectomy  2008   CERVICAL DISC SURGERY  2004   COLONOSCOPY WITH PROPOFOL N/A 04/25/2012   Procedure: COLONOSCOPY WITH PROPOFOL;  Surgeon: Charolett Bumpers, MD;  Location: [facility];  Service: Endoscopy;  Laterality: N/A;   LASIK  2004   left shoulder surgery     RESECTION DISTAL CLAVICAL Left 08/24/2014   Procedure: RESECTION DISTAL CLAVICAL;  Surgeon: Marcene Corning, MD;  Location: [facility];  Service: Orthopedics;  Laterality: Left;   SHOULDER ARTHROSCOPY WITH SUBACROMIAL DECOMPRESSION Left 08/24/2014   Procedure: ARTHROSCOPY  LEFT SHOULDER, SUBACROMIAL DECOMPRESSION, DISTAL CLAVICLE RESECTION AND DEBRIDEMENT;  Surgeon: Marcene Corning, MD;  Location: [facility];  Service: Orthopedics;  Laterality: Left;   Tendernitis Surgery  99    ALLERGIES: Allergies  Allergen Reactions   Morphine And Codeine Itching    Itching nose   Morphine Sulfate Other (See Comments)    Other reaction(s): itchy nose   Sertraline Hcl Other (See Comments)     Other reaction(s): diarrhea   Zolpidem Tartrate     Other reaction(s): nocturnal syncope    FAMILY HISTORY: Family History  Problem Relation Age of Onset   Healthy Mother    Healthy Father    Neuromuscular disorder Neg Hx     SOCIAL HISTORY: Social History   Tobacco Use   Smoking status: Never   Smokeless tobacco: Never  Vaping Use   Vaping status: Never Used  Substance Use Topics   Alcohol use: Yes    Alcohol/week: 14.0 standard drinks of alcohol    Types: 14 Glasses of wine per week   Drug use: No   Social History   Social History Narrative   Not on file     OBJECTIVE: PHYSICAL EXAM: There were no vitals taken for this visit.  General:*** General appearance: Awake and alert. No distress. Cooperative with exam.  Skin: No obvious rash or jaundice. HEENT: Atraumatic. Anicteric. Lungs: Non-labored breathing on room air  Heart: Regular Abdomen: Soft, non tender. Extremities: No edema. No obvious deformity.  Musculoskeletal: No obvious joint swelling. Psych: Affect appropriate.  Neurological: Mental Status: Alert. Speech fluent. No pseudobulbar affect Cranial Nerves: CNII: No RAPD. Visual fields grossly intact. CNIII, IV, VI: PERRL. No nystagmus. EOMI. CN V: Facial sensation intact bilaterally to fine touch. [place] clench strong. Jaw jerk***. CN VII: Facial muscles symmetric and strong. No ptosis at rest or after sustained upgaze***. CN VIII:  Hearing grossly intact bilaterally. CN IX: No hypophonia. CN X: Palate elevates symmetrically. CN XI: Full strength shoulder shrug bilaterally. CN XII: Tongue protrusion full and midline. No atrophy or fasciculations. No significant dysarthria*** Motor: Tone is ***. *** fasciculations in *** extremities. *** atrophy. No grip or percussive myotonia.***  Individual muscle group testing (MRC grade out of 5):  Movement     Neck flexion ***    Neck extension ***     Right Left   Shoulder abduction *** ***    Shoulder adduction *** ***   Shoulder ext rotation *** ***   Shoulder int rotation *** ***   Elbow flexion *** ***   Elbow extension *** ***   Wrist extension *** ***   Wrist flexion *** ***   Finger abduction - FDI *** ***   Finger abduction - ADM *** ***   Finger extension *** ***   Finger distal flexion - 2/3 *** ***   Finger distal flexion - 4/5 *** ***   Thumb flexion - FPL *** ***   Thumb abduction - APB *** ***    Hip flexion *** ***   Hip extension *** ***   Hip adduction *** ***   Hip abduction *** ***   Knee extension *** ***   Knee flexion *** ***   Dorsiflexion *** ***   Plantarflexion *** ***   Inversion *** ***   Eversion *** ***   Great toe extension *** ***   Great toe flexion *** ***     Reflexes:  Right Left   Bicep *** ***   Tricep *** ***   BrRad *** ***   Knee *** ***   Ankle *** ***    Pathological Reflexes: Babinski: *** response bilaterally*** Hoffman: *** Troemner: *** Pectoral: *** Palmomental: *** Facial: *** Midline tap: *** Sensation: Pinprick: *** Vibration: *** Temperature: *** Proprioception: *** Coordination: Intact finger-to- nose-finger bilaterally. Romberg negative.*** Gait: Able to rise from chair with arms crossed unassisted. Normal, narrow-based gait. Able to tandem walk. Able to walk on toes and heels.***  Lab and Test Review: Internal labs: *** 12/11/20: CBC w/ diff: significant for MCV of 97.8 CMP: unremarkable  External labs: ***  Imaging: CT cervical spine wo contrast (12/11/20): FINDINGS: Alignment: There is reversal of the normal cervical spine lordosis.   Approximately 1 mm anterolisthesis of the C7 vertebral body is noted on T1.   Skull base and vertebrae: No acute fracture. Chronic degenerative changes are seen involving the body and tip of the dens. No primary bone lesion or focal pathologic process.   Soft tissues and spinal canal: No prevertebral fluid or swelling. No visible canal  hematoma.   Disc levels: There is mild endplate sclerosis at the level of C3-C4. Marked severity endplate sclerosis is seen at the level of C4-C5.   A metallic density fusion plate and screws are seen along the anterior aspects of the C5, C6 and C7 vertebral bodies.   Bilateral mild to moderate severity facet joint hypertrophy is seen. This is most prominent at the level of C3-C4.   Upper chest: Negative.   Other: None.   IMPRESSION: 1. Status post anterior cervical fusion at the levels of C5 through C7. 2. Multilevel degenerative changes, most prominent at the level of C4-C5. 3. No acute cervical spine fracture.  CT head wo contrast (12/11/20): FINDINGS: Brain: There is mild cerebral atrophy with widening of the extra-axial spaces and ventricular dilatation. There are areas of decreased attenuation within the white  matter tracts of the supratentorial brain, consistent with microvascular disease changes.   A stable 6 mm extra-axial calcification is seen within the left frontal lobe.   Vascular: No hyperdense vessel or unexpected calcification.   Skull: Normal. Negative for fracture or focal lesion.   Sinuses/Orbits: No acute finding.   Other: There is mild to moderate severity left posterior parietal scalp soft tissue swelling. This extends superiorly towards the vertex.   IMPRESSION: 1. Mild to moderate severity left posterior parietal scalp soft tissue swelling without evidence of an acute fracture or acute intracranial abnormality. 2. Stable 6 mm extra-axial calcification within the left frontal lobe which may represent a small meningioma. 3. Generalized cerebral atrophy.  MRI cervical spine wo contrast (12/31/16): FINDINGS:  The cervical vertebrae shows abnormal curvature with anterior plate and screw postsurgical changes at C5-6, C6-7 and mild subluxation of C2 over C3 as well as prominent kyphotic angulation at C4-5.  There are degenerative endplate changes  noted at C4-5 and C5-6 and C6-7.  C2-3 shows tiny focal disc central bulge but no significant compression.  There are minor facet hypertrophic changes bilaterally. C3-4 showsTiny central annular tear and mild facet hypertrophy but no significant compression.  C4-5 shows anterior kyphotic angulation with diffuse disc bulge and anterior effacement of the thecal sac but adequate space posteriorly.  There is mild facet hypertrophy and no significant compression.  C5-6 shows mild facet hypertrophy and bilateral foraminal narrowing but no significant compression.  C6-7 shows mild facet hypertrophy and small bilateral perineural cysts.  C7-T1 also shows prominent facet hypertrophy and mild left foraminal narrowing and small right perineural cyst.  T1-T2 previously described perineal cyst not adequately visualized.  On the present scan no significant compression.  The visualized portion of the lower brainstem, cranial vertebral junction and paraspinal soft tissues appear unremarkable.   IMPRESSION: Abnormal MRI scan of the cervical spine showing postoperative changes of anterior metal plate screws fixation  from C5-C7 with prominent kyphotic deformity and disc osteophyte complex bulge at C4-5 but no significant compression.  There are also mild degenerative changes at  C5-6 and C7-T1 without significant compression.  There is stable small perineural cyst at C6-7, C7-T1    Overall no significant change compared with previous MRI scan dated 01/28/2012.  MRI lumbar spine wo contrast (07/27/2006): Findings:   T11-12: There is a small right paracentral disc protrusion that indents the thecal sac but does not appear to effect the neural structures.   T12-L1: Mild desiccation and bulging of the disc but no herniation or stenosis.   L1-2: Mild desiccation and bulging of the disc but no herniation or stenosis.   L2-3: Mild desiccation and bulging of the disc but no herniation or stenosis.   L3-4: Mild desiccation and  bulging of the disc. Mild facet hypertrophy. No significant stenosis.   L4-5: Mild desiccation and bulging of the disc. Mild facet arthropathy. 1mm of anterolisthesis. No compressive stenosis. The facet pathology could be symptomatic.   L5-S1: Mild desiccation and bulging of the disc. No stenosis.   IMPRESSION:   1. There are ordinary changes of disc desiccation and bulging throughout the region. At the L4-5 level, the patient also has moderate facet arthropathy with a mm or 2 of anterolisthesis. Could the patient's pain relate to facet syndrome?   EMG (01/14/17) of LUE and LLE: Summary: All conductions and muscles were normal (see tables below) Conclusion: This is a normal study. ***  ASSESSMENT: Susan Li is a 82 y.o. female  who presents for evaluation of ***. *** has a relevant medical history of ***. *** neurological examination is pertinent for ***. Available diagnostic data is significant for ***. This constellation of symptoms and objective data would most likely localize to ***. ***  PLAN: -Blood work: *** ***  -Return to clinic ***  The impression above as well as the plan as outlined below were extensively discussed with the patient (in the company of ***) who voiced understanding. All questions were answered to their satisfaction.  The patient was counseled on pertinent fall precautions per the printed material provided today, and as noted under the "Patient Instructions" section below.***  When available, results of the above investigations and possible further recommendations will be communicated to the patient via telephone/MyChart. Patient to call office if not contacted after expected testing turnaround time.   Total time spent reviewing records, interview, history/exam, documentation, and coordination of care on day of encounter:  *** min   Thank you for allowing me to participate in patient's care.  If I can answer any additional questions, I would be pleased to do  so.  Jacquelyne Balint, MD   CC: Thana Ates, MD [address] [place] [address]  CC: Referring provider: Jessica Priest, MD [address] [place],  [place] [address]

## 2022-09-08 ENCOUNTER — Ambulatory Visit: Payer: 59 | Admitting: Allergy and Immunology

## 2022-09-10 ENCOUNTER — Ambulatory Visit: Payer: Medicare Other | Admitting: Neurology

## 2022-09-28 DIAGNOSIS — Z23 Encounter for immunization: Secondary | ICD-10-CM | POA: Diagnosis not present

## 2022-10-21 DIAGNOSIS — H2513 Age-related nuclear cataract, bilateral: Secondary | ICD-10-CM | POA: Diagnosis not present

## 2022-10-21 DIAGNOSIS — H524 Presbyopia: Secondary | ICD-10-CM | POA: Diagnosis not present

## 2022-10-21 DIAGNOSIS — H02825 Cysts of left lower eyelid: Secondary | ICD-10-CM | POA: Diagnosis not present

## 2023-02-09 DIAGNOSIS — M1812 Unilateral primary osteoarthritis of first carpometacarpal joint, left hand: Secondary | ICD-10-CM | POA: Diagnosis not present

## 2023-02-22 DIAGNOSIS — M5412 Radiculopathy, cervical region: Secondary | ICD-10-CM | POA: Diagnosis not present

## 2023-02-22 DIAGNOSIS — M47816 Spondylosis without myelopathy or radiculopathy, lumbar region: Secondary | ICD-10-CM | POA: Diagnosis not present

## 2023-03-02 ENCOUNTER — Ambulatory Visit (INDEPENDENT_AMBULATORY_CARE_PROVIDER_SITE_OTHER): Payer: Medicare Other | Admitting: Allergy and Immunology

## 2023-03-02 ENCOUNTER — Other Ambulatory Visit: Payer: Self-pay

## 2023-03-02 VITALS — BP 128/88 | HR 95 | Temp 98.0°F | Resp 16 | Ht 62.21 in | Wt 166.9 lb

## 2023-03-02 DIAGNOSIS — J4540 Moderate persistent asthma, uncomplicated: Secondary | ICD-10-CM

## 2023-03-02 DIAGNOSIS — K219 Gastro-esophageal reflux disease without esophagitis: Secondary | ICD-10-CM | POA: Diagnosis not present

## 2023-03-02 DIAGNOSIS — J3089 Other allergic rhinitis: Secondary | ICD-10-CM

## 2023-03-02 DIAGNOSIS — R232 Flushing: Secondary | ICD-10-CM

## 2023-03-02 MED ORDER — BUDESONIDE-FORMOTEROL FUMARATE 160-4.5 MCG/ACT IN AERO: 2.0000 | INHALATION_SPRAY | Freq: Two times a day (BID) | RESPIRATORY_TRACT | 1 refills | Status: DC

## 2023-03-02 MED ORDER — OMEPRAZOLE 20 MG PO CPDR: 20.0000 mg | DELAYED_RELEASE_CAPSULE | Freq: Every morning | ORAL | 1 refills | Status: DC

## 2023-03-02 MED ORDER — CETIRIZINE HCL 10 MG PO TABS: 10.0000 mg | ORAL_TABLET | Freq: Every day | ORAL | 5 refills | Status: DC | PRN

## 2023-03-02 MED ORDER — ALBUTEROL SULFATE HFA 108 (90 BASE) MCG/ACT IN AERS: 2.0000 | INHALATION_SPRAY | Freq: Four times a day (QID) | RESPIRATORY_TRACT | 1 refills | Status: DC | PRN

## 2023-03-02 MED ORDER — FAMOTIDINE 20 MG PO TABS: 20.0000 mg | ORAL_TABLET | ORAL | 1 refills | Status: DC | PRN

## 2023-03-02 NOTE — Progress Notes (Unsigned)
[place] [facility] [place] [facility] [place] [facility] [place] [facility] [place]    Follow-up Note  Referring Provider: Thana Ates, MD Primary Provider: Thana Ates, MD Date of Office Visit: 03/02/2023  Subjective:   Susan Li (DOB: 12/09/1940) is a 83 y.o. female who [facility] in re-evaluation of the following:  HPI: Susan Li returns to this clinic in evaluation of asthma, allergic rhinitis, LPR, dry eye, and unsteady balance.  I last saw her in this clinic 01 September 2022.  She has done very well with her airway while consistently using her Symbicort twice a day and she has not required a systemic steroid or an antibiotic for any type of airway issue and she rarely uses a short acting bronchodilator.  Because of an unsteady is this issue she does not really exercise to any degree and she has had some problems with lower back pain.  Her reflux is doing well using omeprazole and she is currently using some famotidine as well.  She has received this years flu vaccine, COVID-vaccine, RSV vaccine, and pneumonia vaccine.  She informs me that she has been having some flushing.  She will develop redness across her body and feel kind of hot and sweaty and this happens a few times per day and can last for 15 minutes or so and has been present for several years with a stable pattern.  There was an interval of time before this flushing developed that she had absolutely no problems associated with flushing since she entered and completed her perimenopausal interval.  Allergies as of 03/02/2023       Reactions   Sertraline Hcl Diarrhea   Zolpidem Tartrate Other (See Comments)   Other reaction(s): nocturnal syncope   Morphine And Codeine Itching   Itching nose   Morphine Sulfate Itching        Medication List    BIOTIN PO Take 5,000 mg by mouth daily.   budesonide-formoterol 160-4.5 MCG/ACT inhaler Commonly known as: SYMBICORT Inhale 2 puffs into the lungs 2  (two) times daily.   CALCIUM + D PO Take 1,200 mg by mouth daily.   cetirizine 10 MG tablet Commonly known as: ZYRTEC Take 1 tablet (10 mg total) by mouth daily as needed for allergies (Can take an extra dose during flare ups.).   cholecalciferol 25 MCG (1000 UNIT) tablet Commonly known as: VITAMIN D3 Take 1 tablet by mouth daily.   melatonin 1 MG Tabs tablet 2 tablet at bedtime as needed   omeprazole 20 MG capsule Commonly known as: PRILOSEC Take 1 capsule (20 mg total) by mouth in the morning.   oxybutynin 5 MG tablet Commonly known as: DITROPAN Take 5 mg by mouth 3 (three) times daily.   triamcinolone 55 MCG/ACT Aero nasal inhaler Commonly known as: NASACORT Place 1 spray into the nose daily.   vitamin C with rose hips 500 MG tablet Take 500 mg by mouth daily.        Past Medical History:  Diagnosis Date   Arthritis    hips,backs,hands; osteoarthritis   Asthma    Symbicort as needed 3x week   Bursitis    Fibromyalgia    GERD (gastroesophageal reflux disease)    Insomnia    Sciatica of right side    Vitamin D deficiency     Past Surgical History:  Procedure Laterality Date   ABDOMINAL HYSTERECTOMY  2000   Buninonectomy  2008   CERVICAL DISC SURGERY  2004  COLONOSCOPY WITH PROPOFOL N/A 04/25/2012   Procedure: COLONOSCOPY WITH PROPOFOL;  Surgeon: Charolett Bumpers, MD;  Location: [facility];  Service: Endoscopy;  Laterality: N/A;   LASIK  2004   left shoulder surgery     RESECTION DISTAL CLAVICAL Left 08/24/2014   Procedure: RESECTION DISTAL CLAVICAL;  Surgeon: Marcene Corning, MD;  Location: [facility];  Service: Orthopedics;  Laterality: Left;   SHOULDER ARTHROSCOPY WITH SUBACROMIAL DECOMPRESSION Left 08/24/2014   Procedure: ARTHROSCOPY  LEFT SHOULDER, SUBACROMIAL DECOMPRESSION, DISTAL CLAVICLE RESECTION AND DEBRIDEMENT;  Surgeon: Marcene Corning, MD;  Location: [facility];  Service: Orthopedics;  Laterality: Left;    Tendernitis Surgery  99    Review of systems negative except as noted in HPI / PMHx or noted below:  ROS   Objective:   Vitals:   03/02/23 1333  BP: 128/88  Pulse: 95  Resp: 16  Temp: 98 F (36.7 C)  SpO2: 96%   Height: 5' 2.21" (158 cm)  Weight: 166 lb 14.4 oz (75.7 kg)   Physical Exam  Diagnostics:    Spirometry was performed and demonstrated an FEV1 of 1.40 at 74 % of predicted.  The patient had an Asthma Control Test with the following results:  .    Assessment and Plan:   No diagnosis found.    1. Continue Symbicort 160 mg  2 puffs 2 times a day   2. Continue omeprazole 20 mg 1 time per day; can add Famotidine 20 mg in evening    3. Continue the following if needed:  A.  OTC Zyrtec 1 time per day   B.  Albuterol 2 puffs every 4-6 hours    C.  Wetting eye drops  D.  Nasacort - 1 spray each nostril 1-2 time per day  4. Return to clinic in 6 months or earlier if problem  5. Influenza = Tamiflu. Covid = Paxlovid  6. Evaluation for flushing???  Overall she is doing pretty well with her airway and has also had her reflux under very good control at this point in time on her current plan as noted above.  Assuming she continues to do this well we will see her back in this clinic in 6 months or earlier should there be a problem.  I will need to investigate about evaluation for late age flushing as this does seem a little bit abnormal and may require some further evaluation.    Laurette Schimke, MD Allergy / Immunology [facility]

## 2023-03-02 NOTE — Patient Instructions (Addendum)
  1. Continue Symbicort 160 mg  2 puffs 2 times a day   2. Continue omeprazole 20 mg 1 time per day; can add Famotidine 20 mg in evening    3. Continue the following if needed:  A.  OTC Zyrtec 1 time per day   B.  Albuterol 2 puffs every 4-6 hours    C.  Wetting eye drops  D.  Nasacort - 1 spray each nostril 1-2 time per day  4. Return to clinic in 6 months or earlier if problem  5. Influenza = Tamiflu. Covid = Paxlovid  6. Evaluation for flushing???

## 2023-03-03 ENCOUNTER — Telehealth: Payer: Self-pay

## 2023-03-03 ENCOUNTER — Encounter: Payer: Self-pay | Admitting: Allergy and Immunology

## 2023-03-03 DIAGNOSIS — R232 Flushing: Secondary | ICD-10-CM

## 2023-03-03 DIAGNOSIS — R61 Generalized hyperhidrosis: Secondary | ICD-10-CM

## 2023-03-03 NOTE — Telephone Encounter (Signed)
Kozlow, Alvira Philips, MD  [facility] Clinical Caller: Unspecified (Today,  6:49 AM) Please inform Susan Li that she needs to the following evaluation for flushing:  6. Evaluation for flushing - TSH, FT4, thyroid peroxidase ab, CBC w/d, CMP + 24 hour urine for 5-HIAA  Labs are ordered thru [facility] associated with flushing. Tried to call pt about this but no answer after 10 rings hung up

## 2023-03-03 NOTE — Telephone Encounter (Signed)
-----   Message from ERIC J KOZLOW sent at 03/03/2023  6:49 AM EST ----- Please inform Susan Li that she needs to the following evaluation for flushing:  6. Evaluation for flushing - TSH, FT4, thyroid peroxidase ab, CBC w/d, CMP + 24 hour urine for 5-HIAA

## 2023-03-03 NOTE — Telephone Encounter (Signed)
Patient plans to call back tomorrow to see if we will be open for her to get the blood work. She also needs her new insurance card scanned in the day she comes for blood work. She forgot she had the card yesterday.

## 2023-03-03 NOTE — Telephone Encounter (Signed)
Refer to previous telephone encounter already opened - 03/03/23 Labs.

## 2023-03-04 NOTE — Telephone Encounter (Signed)
Called patient and informed when we will be open till today. Patient verbalized understanding and will call tomorrow before coming in. She did advise she will bring her insurance cards tomorrow to scan in and wanted the billing coordinator to be aware.

## 2023-03-08 DIAGNOSIS — R61 Generalized hyperhidrosis: Secondary | ICD-10-CM | POA: Diagnosis not present

## 2023-03-08 DIAGNOSIS — R232 Flushing: Secondary | ICD-10-CM | POA: Diagnosis not present

## 2023-03-09 LAB — COMPREHENSIVE METABOLIC PANEL
ALT: 19 [IU]/L (ref 0–32)
AST: 19 [IU]/L (ref 0–40)
Albumin: 4.1 g/dL (ref 3.7–4.7)
Alkaline Phosphatase: 69 [IU]/L (ref 44–121)
BUN/Creatinine Ratio: 20 (ref 12–28)
BUN: 13 mg/dL (ref 8–27)
Bilirubin Total: 0.4 mg/dL (ref 0.0–1.2)
CO2: 21 mmol/L (ref 20–29)
Calcium: 9.1 mg/dL (ref 8.7–10.3)
Chloride: 107 mmol/L — ABNORMAL HIGH (ref 96–106)
Creatinine, Ser: 0.65 mg/dL (ref 0.57–1.00)
Globulin, Total: 2 g/dL (ref 1.5–4.5)
Glucose: 94 mg/dL (ref 70–99)
Potassium: 4.4 mmol/L (ref 3.5–5.2)
Sodium: 140 mmol/L (ref 134–144)
Total Protein: 6.1 g/dL (ref 6.0–8.5)
eGFR: 88 mL/min/{1.73_m2} (ref >59)

## 2023-03-09 LAB — CBC WITH DIFFERENTIAL/PLATELET
Basophils Absolute: 0.1 10*3/uL (ref 0.0–0.2)
Basos: 1 %
EOS (ABSOLUTE): 0.3 10*3/uL (ref 0.0–0.4)
Eos: 6 %
Hematocrit: 42.5 % (ref 34.0–46.6)
Hemoglobin: 14.4 g/dL (ref 11.1–15.9)
Immature Grans (Abs): 0 10*3/uL (ref 0.0–0.1)
Immature Granulocytes: 0 %
Lymphocytes Absolute: 1.3 10*3/uL (ref 0.7–3.1)
Lymphs: 25 %
MCH: 33.3 pg — ABNORMAL HIGH (ref 26.6–33.0)
MCHC: 33.9 g/dL (ref 31.5–35.7)
MCV: 98 fL — ABNORMAL HIGH (ref 79–97)
Monocytes Absolute: 0.6 10*3/uL (ref 0.1–0.9)
Monocytes: 12 %
Neutrophils Absolute: 3 10*3/uL (ref 1.4–7.0)
Neutrophils: 56 %
Platelets: 304 10*3/uL (ref 150–450)
RBC: 4.32 x10E6/uL (ref 3.77–5.28)
RDW: 12.3 % (ref 11.7–15.4)
WBC: 5.3 10*3/uL (ref 3.4–10.8)

## 2023-03-09 LAB — T4, FREE: Free T4: 1.06 ng/dL (ref 0.82–1.77)

## 2023-03-09 LAB — THYROID PEROXIDASE ANTIBODY: Thyroperoxidase Ab SerPl-aCnc: 20 [IU]/mL (ref 0–34)

## 2023-03-09 LAB — TSH: TSH: 1.68 u[IU]/mL (ref 0.450–4.500)

## 2023-03-11 DIAGNOSIS — R232 Flushing: Secondary | ICD-10-CM | POA: Diagnosis not present

## 2023-03-15 LAB — 5 HIAA, QUANTITATIVE, URINE, 24 HOUR
5-HIAA, Ur: 3.9 mg/L
5-HIAA,Quant.,24 Hr Urine: 2.5 mg/(24.h) (ref 0.0–14.9)

## 2023-03-16 DIAGNOSIS — M1812 Unilateral primary osteoarthritis of first carpometacarpal joint, left hand: Secondary | ICD-10-CM | POA: Diagnosis not present

## 2023-03-23 DIAGNOSIS — M47816 Spondylosis without myelopathy or radiculopathy, lumbar region: Secondary | ICD-10-CM | POA: Diagnosis not present

## 2023-03-26 DIAGNOSIS — F322 Major depressive disorder, single episode, severe without psychotic features: Secondary | ICD-10-CM | POA: Diagnosis not present

## 2023-03-26 DIAGNOSIS — R232 Flushing: Secondary | ICD-10-CM | POA: Diagnosis not present

## 2023-03-26 DIAGNOSIS — E781 Pure hyperglyceridemia: Secondary | ICD-10-CM | POA: Diagnosis not present

## 2023-03-26 DIAGNOSIS — M5410 Radiculopathy, site unspecified: Secondary | ICD-10-CM | POA: Diagnosis not present

## 2023-03-26 DIAGNOSIS — R2681 Unsteadiness on feet: Secondary | ICD-10-CM | POA: Diagnosis not present

## 2023-03-26 DIAGNOSIS — M545 Low back pain, unspecified: Secondary | ICD-10-CM | POA: Diagnosis not present

## 2023-03-26 DIAGNOSIS — F5101 Primary insomnia: Secondary | ICD-10-CM | POA: Diagnosis not present

## 2023-03-26 DIAGNOSIS — J45909 Unspecified asthma, uncomplicated: Secondary | ICD-10-CM | POA: Diagnosis not present

## 2023-03-26 DIAGNOSIS — Z0000 Encounter for general adult medical examination without abnormal findings: Secondary | ICD-10-CM | POA: Diagnosis not present

## 2023-03-26 DIAGNOSIS — Z79899 Other long term (current) drug therapy: Secondary | ICD-10-CM | POA: Diagnosis not present

## 2023-03-26 DIAGNOSIS — Z1331 Encounter for screening for depression: Secondary | ICD-10-CM | POA: Diagnosis not present

## 2023-03-26 DIAGNOSIS — E663 Overweight: Secondary | ICD-10-CM | POA: Diagnosis not present

## 2023-04-01 DIAGNOSIS — Z1231 Encounter for screening mammogram for malignant neoplasm of breast: Secondary | ICD-10-CM | POA: Diagnosis not present

## 2023-04-26 DIAGNOSIS — M47816 Spondylosis without myelopathy or radiculopathy, lumbar region: Secondary | ICD-10-CM | POA: Diagnosis not present

## 2023-04-26 DIAGNOSIS — M7522 Bicipital tendinitis, left shoulder: Secondary | ICD-10-CM | POA: Diagnosis not present

## 2023-05-17 DIAGNOSIS — M47816 Spondylosis without myelopathy or radiculopathy, lumbar region: Secondary | ICD-10-CM | POA: Diagnosis not present

## 2023-05-17 DIAGNOSIS — M47812 Spondylosis without myelopathy or radiculopathy, cervical region: Secondary | ICD-10-CM | POA: Diagnosis not present

## 2023-06-01 DIAGNOSIS — M47812 Spondylosis without myelopathy or radiculopathy, cervical region: Secondary | ICD-10-CM | POA: Diagnosis not present

## 2023-06-28 DIAGNOSIS — M5412 Radiculopathy, cervical region: Secondary | ICD-10-CM | POA: Diagnosis not present

## 2023-06-28 DIAGNOSIS — M47812 Spondylosis without myelopathy or radiculopathy, cervical region: Secondary | ICD-10-CM | POA: Diagnosis not present

## 2023-07-03 ENCOUNTER — Other Ambulatory Visit: Payer: Self-pay

## 2023-07-03 ENCOUNTER — Emergency Department (HOSPITAL_COMMUNITY)

## 2023-07-03 ENCOUNTER — Inpatient Hospital Stay (HOSPITAL_COMMUNITY)

## 2023-07-03 ENCOUNTER — Inpatient Hospital Stay (HOSPITAL_COMMUNITY)
Admission: EM | Admit: 2023-07-03 | Discharge: 2023-07-04 | DRG: 185 | Disposition: A | Discharge status: Home / Self Care | Attending: Family Medicine | Admitting: Family Medicine

## 2023-07-03 DIAGNOSIS — Z7951 Long term (current) use of inhaled steroids: Secondary | ICD-10-CM | POA: Diagnosis not present

## 2023-07-03 DIAGNOSIS — J4520 Mild intermittent asthma, uncomplicated: Secondary | ICD-10-CM | POA: Diagnosis not present

## 2023-07-03 DIAGNOSIS — M797 Fibromyalgia: Secondary | ICD-10-CM | POA: Diagnosis present

## 2023-07-03 DIAGNOSIS — R000 Tachycardia, unspecified: Secondary | ICD-10-CM | POA: Diagnosis not present

## 2023-07-03 DIAGNOSIS — X58XXXA Exposure to other specified factors, initial encounter: Secondary | ICD-10-CM | POA: Diagnosis present

## 2023-07-03 DIAGNOSIS — S2249XA Multiple fractures of ribs, unspecified side, initial encounter for closed fracture: Secondary | ICD-10-CM

## 2023-07-03 DIAGNOSIS — S2241XA Multiple fractures of ribs, right side, initial encounter for closed fracture: Secondary | ICD-10-CM | POA: Diagnosis not present

## 2023-07-03 DIAGNOSIS — J9811 Atelectasis: Secondary | ICD-10-CM | POA: Diagnosis not present

## 2023-07-03 DIAGNOSIS — K449 Diaphragmatic hernia without obstruction or gangrene: Secondary | ICD-10-CM | POA: Diagnosis not present

## 2023-07-03 DIAGNOSIS — R1011 Right upper quadrant pain: Secondary | ICD-10-CM | POA: Diagnosis not present

## 2023-07-03 DIAGNOSIS — S3993XA Unspecified injury of pelvis, initial encounter: Secondary | ICD-10-CM | POA: Diagnosis not present

## 2023-07-03 DIAGNOSIS — M549 Dorsalgia, unspecified: Secondary | ICD-10-CM | POA: Diagnosis not present

## 2023-07-03 DIAGNOSIS — Z90710 Acquired absence of both cervix and uterus: Secondary | ICD-10-CM | POA: Diagnosis not present

## 2023-07-03 DIAGNOSIS — R109 Unspecified abdominal pain: Principal | ICD-10-CM

## 2023-07-03 DIAGNOSIS — N281 Cyst of kidney, acquired: Secondary | ICD-10-CM | POA: Diagnosis not present

## 2023-07-03 DIAGNOSIS — S299XXA Unspecified injury of thorax, initial encounter: Secondary | ICD-10-CM | POA: Diagnosis not present

## 2023-07-03 DIAGNOSIS — S3991XA Unspecified injury of abdomen, initial encounter: Secondary | ICD-10-CM | POA: Diagnosis not present

## 2023-07-03 DIAGNOSIS — S3992XA Unspecified injury of lower back, initial encounter: Secondary | ICD-10-CM | POA: Diagnosis not present

## 2023-07-03 DIAGNOSIS — K5730 Diverticulosis of large intestine without perforation or abscess without bleeding: Secondary | ICD-10-CM | POA: Diagnosis not present

## 2023-07-03 DIAGNOSIS — K219 Gastro-esophageal reflux disease without esophagitis: Secondary | ICD-10-CM | POA: Diagnosis present

## 2023-07-03 LAB — URINALYSIS, ROUTINE W REFLEX MICROSCOPIC
Bilirubin Urine: NEGATIVE
Glucose, UA: NEGATIVE mg/dL
Hgb urine dipstick: NEGATIVE
Ketones, ur: 5 mg/dL — AB
Leukocytes,Ua: NEGATIVE
Nitrite: NEGATIVE
Protein, ur: NEGATIVE mg/dL
Specific Gravity, Urine: 1.008 (ref 1.005–1.030)
pH: 5 (ref 5.0–8.0)

## 2023-07-03 LAB — CBC WITH DIFFERENTIAL/PLATELET
Abs Immature Granulocytes: 0.04 10*3/uL (ref 0.00–0.07)
Basophils Absolute: 0 10*3/uL (ref 0.0–0.1)
Basophils Relative: 0 %
Eosinophils Absolute: 0.1 10*3/uL (ref 0.0–0.5)
Eosinophils Relative: 1 %
HCT: 40.9 % (ref 36.0–46.0)
Hemoglobin: 13.7 g/dL (ref 12.0–15.0)
Immature Granulocytes: 1 %
Lymphocytes Relative: 16 %
Lymphs Abs: 1.3 10*3/uL (ref 0.7–4.0)
MCH: 33.3 pg (ref 26.0–34.0)
MCHC: 33.5 g/dL (ref 30.0–36.0)
MCV: 99.3 fL (ref 80.0–100.0)
Monocytes Absolute: 1.1 10*3/uL — ABNORMAL HIGH (ref 0.1–1.0)
Monocytes Relative: 14 %
Neutro Abs: 5.6 10*3/uL (ref 1.7–7.7)
Neutrophils Relative %: 68 %
Platelets: 234 10*3/uL (ref 150–400)
RBC: 4.12 MIL/uL (ref 3.87–5.11)
RDW: 14.2 % (ref 11.5–15.5)
WBC: 8.1 10*3/uL (ref 4.0–10.5)
nRBC: 0 % (ref 0.0–0.2)

## 2023-07-03 LAB — COMPREHENSIVE METABOLIC PANEL WITH GFR
ALT: 20 U/L (ref 0–44)
AST: 23 U/L (ref 15–41)
Albumin: 4.1 g/dL (ref 3.5–5.0)
Alkaline Phosphatase: 66 U/L (ref 38–126)
Anion gap: 11 (ref 5–15)
BUN: 12 mg/dL (ref 8–23)
CO2: 23 mmol/L (ref 22–32)
Calcium: 9 mg/dL (ref 8.9–10.3)
Chloride: 107 mmol/L (ref 98–111)
Creatinine, Ser: 0.65 mg/dL (ref 0.44–1.00)
GFR, Estimated: >60 mL/min (ref >60)
Glucose, Bld: 108 mg/dL — ABNORMAL HIGH (ref 70–99)
Potassium: 3.6 mmol/L (ref 3.5–5.1)
Sodium: 141 mmol/L (ref 135–145)
Total Bilirubin: 1 mg/dL (ref 0.0–1.2)
Total Protein: 7.1 g/dL (ref 6.5–8.1)

## 2023-07-03 LAB — TROPONIN I (HIGH SENSITIVITY)
Troponin I (High Sensitivity): 3 ng/L (ref <18)
Troponin I (High Sensitivity): 3 ng/L (ref <18)

## 2023-07-03 LAB — LIPASE, BLOOD: Lipase: 27 U/L (ref 11–51)

## 2023-07-03 MED ORDER — FENTANYL CITRATE PF 50 MCG/ML IJ SOSY
50.0000 ug | PREFILLED_SYRINGE | Freq: Once | INTRAMUSCULAR | Status: AC
  Administered 2023-07-03: 50 ug via INTRAVENOUS
  Filled 2023-07-03: qty 1

## 2023-07-03 MED ORDER — ONDANSETRON HCL 4 MG PO TABS: 4.0000 mg | ORAL_TABLET | Freq: Four times a day (QID) | ORAL | Status: DC | PRN

## 2023-07-03 MED ORDER — HEPARIN SODIUM (PORCINE) 5000 UNIT/ML IJ SOLN
5000.0000 [IU] | Freq: Three times a day (TID) | INTRAMUSCULAR | Status: DC
  Administered 2023-07-04: 5000 [IU] via SUBCUTANEOUS
  Filled 2023-07-03: qty 1

## 2023-07-03 MED ORDER — ONDANSETRON HCL 4 MG/2ML IJ SOLN
4.0000 mg | Freq: Once | INTRAMUSCULAR | Status: AC
  Administered 2023-07-03: 4 mg via INTRAVENOUS
  Filled 2023-07-03: qty 2

## 2023-07-03 MED ORDER — OXYCODONE HCL 5 MG PO TABS
5.0000 mg | ORAL_TABLET | ORAL | Status: DC | PRN
  Administered 2023-07-04 (×2): 5 mg via ORAL
  Filled 2023-07-03 (×2): qty 1

## 2023-07-03 MED ORDER — METHOCARBAMOL 1000 MG/10ML IJ SOLN: 500.0000 mg | Freq: Three times a day (TID) | INTRAMUSCULAR | Status: DC | PRN

## 2023-07-03 MED ORDER — IOHEXOL 300 MG/ML  SOLN
100.0000 mL | Freq: Once | INTRAMUSCULAR | Status: AC | PRN
  Administered 2023-07-03: 100 mL via INTRAVENOUS

## 2023-07-03 MED ORDER — ONDANSETRON HCL 4 MG/2ML IJ SOLN: 4.0000 mg | Freq: Four times a day (QID) | INTRAMUSCULAR | Status: DC | PRN

## 2023-07-03 MED ORDER — HYDROMORPHONE HCL 1 MG/ML IJ SOLN
0.5000 mg | Freq: Once | INTRAMUSCULAR | Status: AC
  Administered 2023-07-03: 0.5 mg via INTRAVENOUS
  Filled 2023-07-03: qty 1

## 2023-07-03 MED ORDER — SODIUM CHLORIDE 0.9 % IV BOLUS
500.0000 mL | Freq: Once | INTRAVENOUS | Status: AC
  Administered 2023-07-03: 500 mL via INTRAVENOUS

## 2023-07-03 MED ORDER — ACETAMINOPHEN 650 MG RE SUPP: 650.0000 mg | Freq: Four times a day (QID) | RECTAL | Status: DC | PRN

## 2023-07-03 MED ORDER — ACETAMINOPHEN 325 MG PO TABS
650.0000 mg | ORAL_TABLET | Freq: Four times a day (QID) | ORAL | Status: DC | PRN
Start: 2023-07-03 — End: 2023-07-04

## 2023-07-03 MED ORDER — KETOROLAC TROMETHAMINE 15 MG/ML IJ SOLN
15.0000 mg | Freq: Four times a day (QID) | INTRAMUSCULAR | Status: DC | PRN
  Administered 2023-07-04 (×2): 15 mg via INTRAVENOUS
  Filled 2023-07-03 (×2): qty 1

## 2023-07-03 MED ORDER — OXYCODONE-ACETAMINOPHEN 5-325 MG PO TABS
1.0000 | ORAL_TABLET | Freq: Once | ORAL | Status: AC
  Administered 2023-07-03: 1 via ORAL
  Filled 2023-07-03: qty 1

## 2023-07-03 MED ORDER — KETOROLAC TROMETHAMINE 15 MG/ML IJ SOLN
15.0000 mg | Freq: Once | INTRAMUSCULAR | Status: AC
  Administered 2023-07-03: 15 mg via INTRAVENOUS
  Filled 2023-07-03: qty 1

## 2023-07-03 MED ORDER — ALBUTEROL SULFATE (2.5 MG/3ML) 0.083% IN NEBU: 2.5000 mg | INHALATION_SOLUTION | RESPIRATORY_TRACT | Status: DC | PRN

## 2023-07-03 NOTE — ED Triage Notes (Signed)
 Pt arrived via EMS for R sided flank From home, regular bowel and urine. Denies n/v/d. 138/86 100HR 20RR 96% 97.89f

## 2023-07-03 NOTE — ED Notes (Signed)
 Patient currently in CT

## 2023-07-03 NOTE — H&P (Addendum)
 History and Physical    Susan Li FMW:[id number] DOB: 1940/04/22 DOA: 07/03/2023  PCP: Dwight Trula SQUIBB, MD  Patient coming from: home  I have personally briefly reviewed patient's old medical records in [facility]  Chief Complaint: right flank pain   HPI: Susan Li is a 83 y.o. female with medical history significant of  Mild intermittent Asthma, arthritis , fibromyalgia , right sided sciatica, DJD of the spine who presents to ED with compliant of acute onset of Right sided flank pain. Patient notes pain worse with deep breathing and movement. Patient states when laying still pain is minimal but with moving is a 10/10.  Patient notes no recent injuries or falls.  She denies n/v/d/fever chills/ sob/ or chest discomfort. She does note that flank pain radiates to under her right rib cage. Of note on ros per daughter, patient has  sanitary napkin that was bloody. Patient denies noticing this but note she does not pay close attention when using there rest room. She denies bleeding otherwise.  Of abdominal pain.  ED Course:  Patient in ED evaluated with CT ABD /pelvis: which was unremarkable ,also routine labs were also unremarkable as well as cxr. Patient however with refractory pain despite pain medication given in ED. Patient in admitted for pain control and further evaluation.   Temp 99.1, bp 169/103, hr 93, rr 23 sat 99%   RUQ IMPRESSION: Increased echogenicity within the liver likely related to fatty infiltration. No acute abnormality noted.   Wbc 8.1, hgb 13.7, plt 234 Na 141, K 3.6,  glu 108, cr 0.65 Lipase 27 CE3 UA neg  cxr FINDINGS: Cardiac shadow is within normal limits. Lungs are well aerated bilaterally. No focal infiltrate or effusion is seen. Postsurgical changes in the cervical spine are seen.   IMPRESSION: No acute abnormality noted.   EKG: NSR, pvc   Tx: fentanyl , zofran  ns 500cc bolus, dilaudid ,oxycodone   Review of Systems: As per HPI otherwise 10  point review of systems negative.   Past Medical History:  Diagnosis Date   Arthritis    hips,backs,hands; osteoarthritis   Asthma    Symbicort  as needed 3x week   Bursitis    Fibromyalgia    GERD (gastroesophageal reflux disease)    Insomnia    Sciatica of right side    Vitamin D deficiency     Past Surgical History:  Procedure Laterality Date   ABDOMINAL HYSTERECTOMY  2000   Buninonectomy  2008   CERVICAL DISC SURGERY  2004   COLONOSCOPY WITH PROPOFOL  N/A 04/25/2012   Procedure: COLONOSCOPY WITH PROPOFOL ;  Surgeon: Gladis MARLA Louder, MD;  Location: [facility];  Service: Endoscopy;  Laterality: N/A;   LASIK  2004   left shoulder surgery     RESECTION DISTAL CLAVICAL Left 08/24/2014   Procedure: RESECTION DISTAL CLAVICAL;  Surgeon: Maude Herald, MD;  Location: [facility];  Service: Orthopedics;  Laterality: Left;   SHOULDER ARTHROSCOPY WITH SUBACROMIAL DECOMPRESSION Left 08/24/2014   Procedure: ARTHROSCOPY  LEFT SHOULDER, SUBACROMIAL DECOMPRESSION, DISTAL CLAVICLE RESECTION AND DEBRIDEMENT;  Surgeon: Maude Herald, MD;  Location:  SURGERY CENTER;  Service: Orthopedics;  Laterality: Left;   Tendernitis Surgery  99     reports that she has never smoked. She has never used smokeless tobacco. She reports current alcohol use of about 14.0 standard drinks of alcohol per week. She reports that she does not use drugs.  Allergies  Allergen Reactions   Sertraline Hcl Diarrhea   Zolpidem Tartrate  Other (See Comments)    Other reaction(s): nocturnal syncope   Morphine And Codeine Itching    Itching nose   Morphine Sulfate Itching    Family History  Problem Relation Age of Onset   Healthy Mother    Healthy Father    Neuromuscular disorder Neg Hx     Prior to Admission medications   Medication Sig Start Date End Date Taking? Authorizing Provider  albuterol  (VENTOLIN  HFA) 108 (90 Base) MCG/ACT inhaler Inhale 2 puffs into the lungs every 6 (six) hours as  needed for wheezing or shortness of breath. 03/02/23   Kozlow, Eric J, MD  Ascorbic Acid (VITAMIN C WITH ROSE HIPS) 500 MG tablet Take 500 mg by mouth daily.    [provider]  BIOTIN PO Take 5,000 mg by mouth daily.     [provider]  budesonide -formoterol  (SYMBICORT ) 160-4.5 MCG/ACT inhaler Inhale 2 puffs into the lungs 2 (two) times daily. 03/02/23   Kozlow, Eric J, MD  Calcium Citrate-Vitamin D (CALCIUM + D PO) Take 1,200 mg by mouth daily.    [provider]  cetirizine  (ZYRTEC ) 10 MG tablet Take 1 tablet (10 mg total) by mouth daily as needed for allergies (Can take an extra dose during flare ups.). 03/02/23   Kozlow, Eric J, MD  cholecalciferol (VITAMIN D3) 25 MCG (1000 UNIT) tablet Take 1 tablet by mouth daily.    [provider]  famotidine  (PEPCID ) 20 MG tablet Take 1 tablet (20 mg total) by mouth as needed for heartburn or indigestion. 1 tablet by mouth in the evening if needed. 03/02/23   Kozlow, Eric J, MD  melatonin 1 MG TABS tablet 2 tablet at bedtime as needed    [provider]  omeprazole  (PRILOSEC) 20 MG capsule Take 1 capsule (20 mg total) by mouth in the morning. 03/02/23   Kozlow, Eric J, MD  oxybutynin (DITROPAN) 5 MG tablet Take 5 mg by mouth 3 (three) times daily. 08/21/15   [provider]  triamcinolone  (NASACORT ) 55 MCG/ACT AERO nasal inhaler Place 1 spray into the nose daily. 03/10/22   Maurilio Camellia PARAS, MD    Physical Exam: Vitals:   07/03/23 1623 07/03/23 1900 07/03/23 2242  BP: (!) 169/103 (!) 166/108   Pulse: 93 (!) 105   Resp: (!) 23 14   Temp: 99.1 F (37.3 C)  97.8 F (36.6 C)  TempSrc: Oral  Oral  SpO2: 99% 99%     Constitutional: NAD, calm, comfortable Vitals:   07/03/23 1623 07/03/23 1900 07/03/23 2242  BP: (!) 169/103 (!) 166/108   Pulse: 93 (!) 105   Resp: (!) 23 14   Temp: 99.1 F (37.3 C)  97.8 F (36.6 C)  TempSrc: Oral  Oral  SpO2: 99% 99%    Eyes: PERRL, lids and conjunctivae  normal ENMT: Mucous membranes are moist. Posterior pharynx clear of any exudate or lesions.Normal dentition.  Neck: normal, supple, no masses, no thyromegaly Respiratory: clear to auscultation bilaterally, no wheezing, no crackles. Normal respiratory effort. No accessory muscle use.  Cardiovascular: Regular rate and rhythm, no murmurs / rubs / gallops. No extremity edema. 2+ pedal pulses. No carotid bruits.  Abdomen: no tenderness, no masses palpated. No hepatosplenomegaly. Bowel sounds positive.  Musculoskeletal: no clubbing / cyanosis. No joint deformity upper and lower extremities. Good ROM, no contractures. Normal muscle tone. Severe pain with ROM of spine and sitting up Skin: no rashes, lesions, ulcers. No induration Neurologic: CN 2-12 grossly intact. Sensation intact, Strength 5/5  in all 4.  Psychiatric: Normal judgment and insight. Alert and oriented x 3. Normal mood.    Labs on Admission: I have personally reviewed following labs and imaging studies  CBC: Recent Labs  Lab 07/03/23 1745  WBC 8.1  NEUTROABS 5.6  HGB 13.7  HCT 40.9  MCV 99.3  PLT 234   Basic Metabolic Panel: Recent Labs  Lab 07/03/23 1745  NA 141  K 3.6  CL 107  CO2 23  GLUCOSE 108*  BUN 12  CREATININE 0.65  CALCIUM 9.0   GFR: CrCl cannot be calculated (Unknown ideal weight.). Liver Function Tests: Recent Labs  Lab 07/03/23 1745  AST 23  ALT 20  ALKPHOS 66  BILITOT 1.0  PROT 7.1  ALBUMIN 4.1   Recent Labs  Lab 07/03/23 1745  LIPASE 27   No results for input(s): AMMONIA in the last 168 hours. Coagulation Profile: No results for input(s): INR, PROTIME in the last 168 hours. Cardiac Enzymes: No results for input(s): CKTOTAL, CKMB, CKMBINDEX, TROPONINI in the last 168 hours. BNP (last 3 results) No results for input(s): PROBNP in the last 8760 hours. HbA1C: No results for input(s): HGBA1C in the last 72 hours. CBG: No results for input(s): GLUCAP in the last 168  hours. Lipid Profile: No results for input(s): CHOL, HDL, LDLCALC, TRIG, CHOLHDL, LDLDIRECT in the last 72 hours. Thyroid  Function Tests: No results for input(s): TSH, T4TOTAL, FREET4, T3FREE, THYROIDAB in the last 72 hours. Anemia Panel: No results for input(s): VITAMINB12, FOLATE, FERRITIN, TIBC, IRON, RETICCTPCT in the last 72 hours. Urine analysis:    Component Value Date/Time   COLORURINE STRAW (A) 07/03/2023 1746   APPEARANCEUR CLEAR 07/03/2023 1746   LABSPEC 1.008 07/03/2023 1746   PHURINE 5.0 07/03/2023 1746   GLUCOSEU NEGATIVE 07/03/2023 1746   HGBUR NEGATIVE 07/03/2023 1746   BILIRUBINUR NEGATIVE 07/03/2023 1746   KETONESUR 5 (A) 07/03/2023 1746   PROTEINUR NEGATIVE 07/03/2023 1746   UROBILINOGEN 0.2 09/10/2018 1206   NITRITE NEGATIVE 07/03/2023 1746   LEUKOCYTESUR NEGATIVE 07/03/2023 1746    Radiological Exams on Admission: CT ABDOMEN PELVIS W CONTRAST Result Date: 07/03/2023 CLINICAL DATA:  Abdominal pain, acute, nonlocalized Abdominal/flank pain, stone suspected ruq/r flank EXAM: CT ABDOMEN AND PELVIS WITH CONTRAST TECHNIQUE: Multidetector CT imaging of the abdomen and pelvis was performed using the standard protocol following bolus administration of intravenous contrast. RADIATION DOSE REDUCTION: This exam was performed according to the departmental dose-optimization program which includes automated exposure control, adjustment of the mA and/or kV according to patient size and/or use of iterative reconstruction technique. CONTRAST:  OMNIPAQUE IOHEXOL 300 MG/ML  SOLN COMPARISON:  Us  abd 07/03/23 FINDINGS: Lower chest: No acute abnormality. Moderate to large volume hiatal hernia containing the majority of the stomach. Hepatobiliary: The hepatic parenchyma is diffusely hypodense compared to the splenic parenchyma consistent with fatty infiltration. No focal liver abnormality. No gallstones, gallbladder wall thickening, or pericholecystic  fluid. No biliary dilatation. Pancreas: No focal lesion. Normal pancreatic contour. No surrounding inflammatory changes. No main pancreatic ductal dilatation. Spleen: Normal in size. Focal hypodensity of the spleen-nonspecific. Adrenals/Urinary Tract: No adrenal nodule bilaterally. Bilateral kidneys enhance symmetrically. Simple free fluid peripelvic cysts. Simple renal cysts, in the absence of clinically indicated signs/symptoms, require no independent follow-up. No hydronephrosis. No hydroureter. The urinary bladder is unremarkable. On delayed imaging, there is no urothelial wall thickening and there are no filling defects in the opacified portions of the bilateral collecting systems or ureters. Stomach/Bowel: Stomach is within normal limits. No  evidence of bowel wall thickening or dilatation. Colonic diverticulosis. Slightly hypertrophic ileocecal valve. Appendix appears normal. Vascular/Lymphatic: No abdominal aorta or iliac aneurysm. Moderate atherosclerotic plaque of the aorta and its branches. No abdominal, pelvic, or inguinal lymphadenopathy. Reproductive: Status post hysterectomy. No adnexal masses. Other: No intraperitoneal free fluid. No intraperitoneal free gas. No organized fluid collection. Musculoskeletal: No abdominal wall hernia or abnormality. No suspicious lytic or blastic osseous lesions. No acute displaced fracture. Grade 1 anterolisthesis of L4 on L5. Multilevel degenerative change of the spine. Bilateral hip degenerative changes. IMPRESSION: 1. Moderate to large volume hiatal hernia containing the majority of the stomach. 2. Hepatic steatosis. 3. Colonic diverticulosis with no acute diverticulitis. 4.  Aortic Atherosclerosis (ICD10-I70.0). Electronically Signed   By: Morgane  Naveau M.D.   On: 07/03/2023 19:34   DG Chest Port 1 View Result Date: 07/03/2023 CLINICAL DATA:  Right-sided flank pain, initial encounter EXAM: PORTABLE CHEST 1 VIEW COMPARISON:  01/18/2019 FINDINGS: Cardiac shadow  is within normal limits. Lungs are well aerated bilaterally. No focal infiltrate or effusion is seen. Postsurgical changes in the cervical spine are seen. IMPRESSION: No acute abnormality noted. Electronically Signed   By: Oneil Devonshire M.D.   On: 07/03/2023 18:15   US  Abdomen Limited RUQ (LIVER/GB) Result Date: 07/03/2023 CLINICAL DATA:  Right upper quadrant pain for 1 day EXAM: ULTRASOUND ABDOMEN LIMITED RIGHT UPPER QUADRANT COMPARISON:  07/16/2020 FINDINGS: Gallbladder: No gallstones or wall thickening visualized. No sonographic Murphy sign noted by sonographer. Common bile duct: Diameter: 3 mm Liver: Increased in echogenicity and stable from the prior exam. No focal mass is noted. Portal vein is patent on color Doppler imaging with normal direction of blood flow towards the liver. Other: None. IMPRESSION: Increased echogenicity within the liver likely related to fatty infiltration. No acute abnormality noted. Electronically Signed   By: Oneil Devonshire M.D.   On: 07/03/2023 18:14    EKG: Independently reviewed. N/a  Assessment/Plan  Intractable back/flank pain  -hx of DJD of the spine  r/o compression fx -note radiation to anterior rib area  -worse with movement and deep inspiration  - CT abd/pelvis negative  -no other associated signs - admit med tele  - concern for radiculopathy/compression fracture or MSK etiology nos -cannot r/o lung etiology - d-dimer / CT thorax as well  -supportive care with toradol , prednisone, and oxycodone   -PT to see in am   Mild Intermittent Asthma -well controlled  -Resume home regimen   Fibromyalgia -? Flare of fibromyalgia  -give a trial of lyrica   ? Blood in Urine -UA negative / h/h stable at 13.7 DVT prophylaxis: heparin Code Status: full/ as discussed per patient wishes in event of cardiac arrest  Family Communication: dtr at bedside Alternate Contact Person    delgado,leslie (Daughter) [phone] (Mobile)   Disposition Plan: patient   expected to be admitted greater than 2 midnights  Consults called: none Admission status: SDU   Camila DELENA Ned MD [facility]   If 7PM-7AM, please contact night-coverage [email or web address] Password [facility]  07/03/2023, 10:45 PM

## 2023-07-03 NOTE — ED Provider Notes (Signed)
  EMERGENCY DEPARTMENT AT [facility] Provider Note   CSN: [id number] Arrival date & time: 07/03/23  1611     Patient presents with: Flank Pain   Susan Li is a 83 y.o. female.  She has a history of asthma fibromyalgia reflux.  Woke up this morning with right flank pain.  Worse with bending and twisting taking a deep breath.  Does not feel particularly short of breath and denies any chest pain.  Does endorse a little bit of right upper quadrant abdominal pain.  No nausea vomiting diarrhea constipation or urinary symptoms.  No fevers or chills.  No prior history of similar pain.  No trauma.  Has tried nothing for her symptoms and states her pain is severe.  {Add pertinent medical, surgical, social history, OB history to YEP:67052} The history is provided by the patient.  Flank Pain This is a new problem. The current episode started 3 to 5 hours ago. The problem occurs constantly. The problem has not changed since onset.Associated symptoms include abdominal pain. Pertinent negatives include no chest pain, no headaches and no shortness of breath. The symptoms are aggravated by bending, twisting and walking. Nothing relieves the symptoms. She has tried rest for the symptoms. The treatment provided no relief.       Prior to Admission medications   Medication Sig Start Date End Date Taking? Authorizing Provider  albuterol  (VENTOLIN  HFA) 108 (90 Base) MCG/ACT inhaler Inhale 2 puffs into the lungs every 6 (six) hours as needed for wheezing or shortness of breath. 03/02/23   Kozlow, Eric J, MD  Ascorbic Acid (VITAMIN C WITH ROSE HIPS) 500 MG tablet Take 500 mg by mouth daily.    [provider]  BIOTIN PO Take 5,000 mg by mouth daily.     [provider]  budesonide -formoterol  (SYMBICORT ) 160-4.5 MCG/ACT inhaler Inhale 2 puffs into the lungs 2 (two) times daily. 03/02/23   Kozlow, Eric J, MD  Calcium Citrate-Vitamin D (CALCIUM + D PO) Take 1,200 mg by mouth  daily.    [provider]  cetirizine  (ZYRTEC ) 10 MG tablet Take 1 tablet (10 mg total) by mouth daily as needed for allergies (Can take an extra dose during flare ups.). 03/02/23   Kozlow, Eric J, MD  cholecalciferol (VITAMIN D3) 25 MCG (1000 UNIT) tablet Take 1 tablet by mouth daily.    [provider]  famotidine  (PEPCID ) 20 MG tablet Take 1 tablet (20 mg total) by mouth as needed for heartburn or indigestion. 1 tablet by mouth in the evening if needed. 03/02/23   Kozlow, Eric J, MD  melatonin 1 MG TABS tablet 2 tablet at bedtime as needed    [provider]  omeprazole  (PRILOSEC) 20 MG capsule Take 1 capsule (20 mg total) by mouth in the morning. 03/02/23   Kozlow, Eric J, MD  oxybutynin (DITROPAN) 5 MG tablet Take 5 mg by mouth 3 (three) times daily. 08/21/15   [provider]  triamcinolone  (NASACORT ) 55 MCG/ACT AERO nasal inhaler Place 1 spray into the nose daily. 03/10/22   Kozlow, Camellia PARAS, MD    Allergies: Sertraline hcl, Zolpidem tartrate, Morphine and codeine, and Morphine sulfate    Review of Systems  Constitutional:  Negative for fever.  HENT:  Negative for sore throat.   Respiratory:  Negative for shortness of breath.   Cardiovascular:  Negative for chest pain.  Gastrointestinal:  Positive for abdominal pain. Negative for nausea and vomiting.  Genitourinary:  Positive for flank  pain. Negative for dysuria.  Musculoskeletal:  Positive for back pain.  Skin:  Negative for rash.  Neurological:  Negative for headaches.    Updated Vital Signs BP (!) 169/103 (BP Location: Right Arm)   Pulse 93   Temp 99.1 F (37.3 C) (Oral)   Resp (!) 23   SpO2 99%   Physical Exam Vitals and nursing note reviewed.  Constitutional:      General: She is not in acute distress.    Appearance: Normal appearance. She is well-developed.  HENT:     Head: Normocephalic and atraumatic.   Eyes:     Conjunctiva/sclera: Conjunctivae normal.    Cardiovascular:      Rate and Rhythm: Normal rate and regular rhythm.     Heart sounds: No murmur heard. Pulmonary:     Effort: Pulmonary effort is normal. No respiratory distress.     Breath sounds: Normal breath sounds. No stridor. No wheezing.  Abdominal:     Palpations: Abdomen is soft.     Tenderness: There is abdominal tenderness (RUQ). There is rebound. There is no guarding.   Musculoskeletal:        General: No tenderness or deformity. Normal range of motion.     Cervical back: Neck supple.   Skin:    General: Skin is warm and dry.   Neurological:     General: No focal deficit present.     Mental Status: She is alert.     GCS: GCS eye subscore is 4. GCS verbal subscore is 5. GCS motor subscore is 6.     Sensory: No sensory deficit.     Motor: No weakness.     (all labs ordered are listed, but only abnormal results are displayed) Labs Reviewed  COMPREHENSIVE METABOLIC PANEL WITH GFR  LIPASE, BLOOD  CBC WITH DIFFERENTIAL/PLATELET  URINALYSIS, ROUTINE W REFLEX MICROSCOPIC  TROPONIN I (HIGH SENSITIVITY)    EKG: None  Radiology: No results found.  {Document cardiac monitor, telemetry assessment procedure when appropriate:32947} Procedures   Medications Ordered in the ED  fentaNYL  (SUBLIMAZE ) injection 50 mcg (has no administration in time range)  sodium chloride  0.9 % bolus 500 mL (has no administration in time range)  ondansetron  (ZOFRAN ) injection 4 mg (has no administration in time range)      {Click here for ABCD2, HEART and other calculators REFRESH Note before signing:1}                              Medical Decision Making Amount and/or Complexity of Data Reviewed Labs: ordered. Radiology: ordered.  Risk Prescription drug management.   This patient complains of ***; this involves an extensive number of treatment Options and is a complaint that carries with it a high risk of complications and morbidity. The differential includes ***  I ordered, reviewed and  interpreted labs, which included *** I ordered medication *** and reviewed PMP when indicated. I ordered imaging studies which included *** and I independently    visualized and interpreted imaging which showed *** Additional history obtained from *** Previous records obtained and reviewed *** I consulted *** and discussed lab and imaging findings and discussed disposition.  Cardiac monitoring reviewed, *** Social determinants considered, *** Critical Interventions: ***  After the interventions stated above, I reevaluated the patient and found *** Admission and further testing considered, ***   {Document critical care time when appropriate  Document review of labs and clinical decision tools ie CHADS2VASC2,  etc  Document your independent review of radiology images and any outside records  Document your discussion with family members, caretakers and with consultants  Document social determinants of health affecting pt's care  Document your decision making why or why not admission, treatments were needed:32947:::1}   Final diagnoses:  None    ED Discharge Orders     None

## 2023-07-03 NOTE — ED Notes (Signed)
 Patient returned from CT

## 2023-07-04 ENCOUNTER — Inpatient Hospital Stay (HOSPITAL_COMMUNITY)

## 2023-07-04 DIAGNOSIS — R1011 Right upper quadrant pain: Secondary | ICD-10-CM | POA: Diagnosis not present

## 2023-07-04 DIAGNOSIS — S2241XA Multiple fractures of ribs, right side, initial encounter for closed fracture: Secondary | ICD-10-CM | POA: Diagnosis not present

## 2023-07-04 LAB — CBC
HCT: 38 % (ref 36.0–46.0)
HCT: 39.2 % (ref 36.0–46.0)
Hemoglobin: 12.6 g/dL (ref 12.0–15.0)
Hemoglobin: 12.7 g/dL (ref 12.0–15.0)
MCH: 32.7 pg (ref 26.0–34.0)
MCH: 33.2 pg (ref 26.0–34.0)
MCHC: 32.1 g/dL (ref 30.0–36.0)
MCHC: 33.4 g/dL (ref 30.0–36.0)
MCV: 101.8 fL — ABNORMAL HIGH (ref 80.0–100.0)
MCV: 99.5 fL (ref 80.0–100.0)
Platelets: 202 10*3/uL (ref 150–400)
Platelets: 210 10*3/uL (ref 150–400)
RBC: 3.82 MIL/uL — ABNORMAL LOW (ref 3.87–5.11)
RBC: 3.85 MIL/uL — ABNORMAL LOW (ref 3.87–5.11)
RDW: 14.3 % (ref 11.5–15.5)
RDW: 14.3 % (ref 11.5–15.5)
WBC: 5.4 10*3/uL (ref 4.0–10.5)
WBC: 6.9 10*3/uL (ref 4.0–10.5)
nRBC: 0 % (ref 0.0–0.2)
nRBC: 0 % (ref 0.0–0.2)

## 2023-07-04 LAB — COMPREHENSIVE METABOLIC PANEL WITH GFR
ALT: 19 U/L (ref 0–44)
AST: 18 U/L (ref 15–41)
Albumin: 3.4 g/dL — ABNORMAL LOW (ref 3.5–5.0)
Alkaline Phosphatase: 59 U/L (ref 38–126)
Anion gap: 8 (ref 5–15)
BUN: 11 mg/dL (ref 8–23)
CO2: 23 mmol/L (ref 22–32)
Calcium: 8.4 mg/dL — ABNORMAL LOW (ref 8.9–10.3)
Chloride: 108 mmol/L (ref 98–111)
Creatinine, Ser: 0.48 mg/dL (ref 0.44–1.00)
GFR, Estimated: >60 mL/min (ref >60)
Glucose, Bld: 103 mg/dL — ABNORMAL HIGH (ref 70–99)
Potassium: 3.3 mmol/L — ABNORMAL LOW (ref 3.5–5.1)
Sodium: 139 mmol/L (ref 135–145)
Total Bilirubin: 1.2 mg/dL (ref 0.0–1.2)
Total Protein: 6 g/dL — ABNORMAL LOW (ref 6.5–8.1)

## 2023-07-04 LAB — C-REACTIVE PROTEIN: CRP: 2.8 mg/dL — ABNORMAL HIGH (ref <1.0)

## 2023-07-04 LAB — CREATININE, SERUM
Creatinine, Ser: 0.59 mg/dL (ref 0.44–1.00)
GFR, Estimated: >60 mL/min (ref >60)

## 2023-07-04 LAB — SEDIMENTATION RATE: Sed Rate: 2 mm/h (ref 0–22)

## 2023-07-04 LAB — D-DIMER, QUANTITATIVE: D-Dimer, Quant: 0.53 ug{FEU}/mL — ABNORMAL HIGH (ref 0.00–0.50)

## 2023-07-04 MED ORDER — ACETAMINOPHEN 500 MG PO TABS: 500.0000 mg | ORAL_TABLET | Freq: Three times a day (TID) | ORAL | 0 refills | Status: AC | PRN

## 2023-07-04 MED ORDER — OXYCODONE HCL 5 MG PO TABS: 5.0000 mg | ORAL_TABLET | ORAL | 0 refills | Status: AC | PRN

## 2023-07-04 MED ORDER — POTASSIUM CHLORIDE CRYS ER 20 MEQ PO TBCR
40.0000 meq | EXTENDED_RELEASE_TABLET | Freq: Once | ORAL | Status: AC
  Administered 2023-07-04: 40 meq via ORAL
  Filled 2023-07-04: qty 2

## 2023-07-04 MED ORDER — LIDOCAINE 5 % EX PTCH: 1.0000 | MEDICATED_PATCH | CUTANEOUS | 0 refills | Status: AC

## 2023-07-04 MED ORDER — DICLOFENAC SODIUM 1 % EX GEL
4.0000 g | Freq: Four times a day (QID) | CUTANEOUS | Status: DC
  Administered 2023-07-04: 4 g via TOPICAL
  Filled 2023-07-04: qty 100

## 2023-07-04 MED ORDER — IOHEXOL 350 MG/ML SOLN
75.0000 mL | Freq: Once | INTRAVENOUS | Status: AC | PRN
  Administered 2023-07-04: 75 mL via INTRAVENOUS

## 2023-07-04 MED ORDER — LIDOCAINE 5 % EX PTCH: 1.0000 | MEDICATED_PATCH | Freq: Every day | CUTANEOUS | Status: DC

## 2023-07-04 MED ORDER — DICLOFENAC SODIUM 1 % EX GEL: 4.0000 g | Freq: Four times a day (QID) | CUTANEOUS | 0 refills | Status: AC

## 2023-07-04 NOTE — Discharge Summary (Signed)
 Physician Discharge Summary   Patient: Susan Li MRN: [id number] DOB: 04-20-1940  Admit date:     07/03/2023  Discharge date: {dischdate:26783}  Discharge Physician: Elgin Lam   PCP: Dwight Trula SQUIBB, MD   Recommendations at discharge:  {Tip this will not be part of the note when signed- Example include specific recommendations for outpatient follow-up, pending tests to follow-up on. (Optional):26781}  ***  Discharge Diagnoses: Principal Problem:   Intractable back pain  Resolved Problems:   * No resolved hospital problems. [facility] Course: No notes on file  Assessment and Plan: No notes have been filed under this hospital service. Service: Hospitalist     {Tip this will not be part of the note when signed There is no height or weight on file to calculate BMI. , ,  (Optional):26781}  {(NOTE) Pain control PDMP Statment (Optional):26782} Consultants: *** Procedures performed: ***  Disposition: {Plan; Disposition:26390} Diet recommendation:  {Diet_Plan:26776} DISCHARGE MEDICATION: Allergies as of 07/04/2023       Reactions   Sertraline Hcl Diarrhea   Zolpidem Tartrate Other (See Comments)   Other reaction(s): nocturnal syncope   Morphine And Codeine Itching   Itching nose   Morphine Sulfate Itching     Med Rec must be completed prior to using this [facility]***       Discharge Exam: There were no vitals filed for this visit. ***  Condition at discharge: {DC Condition:26389}  The results of significant diagnostics from this hospitalization (including imaging, microbiology, ancillary and laboratory) are listed below for reference.   Imaging Studies: CT Angio Chest Pulmonary Embolism (PE) W or WO Contrast Result Date: 07/04/2023 CLINICAL DATA:  Right-sided chest pain EXAM: CT ANGIOGRAPHY CHEST WITH CONTRAST TECHNIQUE: Multidetector CT imaging of the chest was performed using the standard protocol during bolus administration of intravenous contrast.  Multiplanar CT image reconstructions and MIPs were obtained to evaluate the vascular anatomy. RADIATION DOSE REDUCTION: This exam was performed according to the departmental dose-optimization program which includes automated exposure control, adjustment of the mA and/or kV according to patient size and/or use of iterative reconstruction technique. CONTRAST:  75mL OMNIPAQUE IOHEXOL 350 MG/ML SOLN COMPARISON:  Chest x-ray from the previous day. FINDINGS: Cardiovascular: Atherosclerotic calcifications are noted. No aneurysmal dilatation or dissection is seen. No cardiac enlargement is noted. Mild coronary calcifications are seen. Pulmonary artery shows a normal branching pattern bilaterally. No intraluminal filling defect is identified. Mediastinum/Nodes: Thoracic inlet is within normal limits. No hilar or mediastinal adenopathy is noted. The esophagus as visualized is within normal limits. Moderate-sized sliding-type hiatal hernia is noted. Lungs/Pleura: Lungs are well aerated bilaterally. Mild dependent atelectatic changes are seen on the left. No pneumothorax or sizable effusion is seen. Upper Abdomen: Within normal limits. Musculoskeletal: Degenerative changes of the thoracic spine are noted. Postsurgical changes in the cervical spine are seen. Mildly displaced right seventh through ninth rib fractures are seen. Review of the MIP images confirms the above findings. IMPRESSION: No evidence of pulmonary emboli. Mild dependent atelectatic changes. Fractures of the seventh through ninth ribs on the right without complicating factors. Electronically Signed   By: Oneil Devonshire M.D.   On: 07/04/2023 02:21   CT THORACIC SPINE WO CONTRAST Result Date: 07/03/2023 CLINICAL DATA:  Trauma EXAM: CT Thoracic and Lumbar spine without contrast TECHNIQUE: Multiplanar CT images of the thoracic and lumbar spine were reconstructed from contemporary CT of the Chest, Abdomen, and Pelvis. RADIATION DOSE REDUCTION: This exam was  performed according to the departmental dose-optimization program which  includes automated exposure control, adjustment of the mA and/or kV according to patient size and/or use of iterative reconstruction technique. CONTRAST:  No additional contrast administered COMPARISON:  None Available. FINDINGS: CT THORACIC SPINE FINDINGS Alignment: Normal. Vertebrae: No acute fracture or focal pathologic process. Paraspinal and other soft tissues: Hiatal hernia Disc levels: No spinal canal stenosis CT LUMBAR SPINE FINDINGS Segmentation: 5 lumbar type vertebrae. Alignment: Grade 1 retrolisthesis at L1-2 grade 1 anterolisthesis at L4-5. Vertebrae: No acute fracture. Paraspinal and other soft tissues: Calcific aortic atherosclerosis. Disc levels: No lumbar spinal canal stenosis. Multilevel moderate facet arthrosis of the lower lumbar spine. No neural impingement. IMPRESSION: 1. No acute fracture or traumatic listhesis of the thoracic or lumbar spine. 2. Hiatal hernia. Aortic Atherosclerosis (ICD10-I70.0). Electronically Signed   By: Franky Stanford M.D.   On: 07/03/2023 23:42   CT LUMBAR SPINE WO CONTRAST Result Date: 07/03/2023 CLINICAL DATA:  Trauma EXAM: CT Thoracic and Lumbar spine without contrast TECHNIQUE: Multiplanar CT images of the thoracic and lumbar spine were reconstructed from contemporary CT of the Chest, Abdomen, and Pelvis. RADIATION DOSE REDUCTION: This exam was performed according to the departmental dose-optimization program which includes automated exposure control, adjustment of the mA and/or kV according to patient size and/or use of iterative reconstruction technique. CONTRAST:  No additional contrast administered COMPARISON:  None Available. FINDINGS: CT THORACIC SPINE FINDINGS Alignment: Normal. Vertebrae: No acute fracture or focal pathologic process. Paraspinal and other soft tissues: Hiatal hernia Disc levels: No spinal canal stenosis CT LUMBAR SPINE FINDINGS Segmentation: 5 lumbar type vertebrae.  Alignment: Grade 1 retrolisthesis at L1-2 grade 1 anterolisthesis at L4-5. Vertebrae: No acute fracture. Paraspinal and other soft tissues: Calcific aortic atherosclerosis. Disc levels: No lumbar spinal canal stenosis. Multilevel moderate facet arthrosis of the lower lumbar spine. No neural impingement. IMPRESSION: 1. No acute fracture or traumatic listhesis of the thoracic or lumbar spine. 2. Hiatal hernia. Aortic Atherosclerosis (ICD10-I70.0). Electronically Signed   By: Franky Stanford M.D.   On: 07/03/2023 23:42   CT ABDOMEN PELVIS W CONTRAST Result Date: 07/03/2023 CLINICAL DATA:  Abdominal pain, acute, nonlocalized Abdominal/flank pain, stone suspected ruq/r flank EXAM: CT ABDOMEN AND PELVIS WITH CONTRAST TECHNIQUE: Multidetector CT imaging of the abdomen and pelvis was performed using the standard protocol following bolus administration of intravenous contrast. RADIATION DOSE REDUCTION: This exam was performed according to the departmental dose-optimization program which includes automated exposure control, adjustment of the mA and/or kV according to patient size and/or use of iterative reconstruction technique. CONTRAST:  OMNIPAQUE IOHEXOL 300 MG/ML  SOLN COMPARISON:  Us  abd 07/03/23 FINDINGS: Lower chest: No acute abnormality. Moderate to large volume hiatal hernia containing the majority of the stomach. Hepatobiliary: The hepatic parenchyma is diffusely hypodense compared to the splenic parenchyma consistent with fatty infiltration. No focal liver abnormality. No gallstones, gallbladder wall thickening, or pericholecystic fluid. No biliary dilatation. Pancreas: No focal lesion. Normal pancreatic contour. No surrounding inflammatory changes. No main pancreatic ductal dilatation. Spleen: Normal in size. Focal hypodensity of the spleen-nonspecific. Adrenals/Urinary Tract: No adrenal nodule bilaterally. Bilateral kidneys enhance symmetrically. Simple free fluid peripelvic cysts. Simple renal cysts, in  the absence of clinically indicated signs/symptoms, require no independent follow-up. No hydronephrosis. No hydroureter. The urinary bladder is unremarkable. On delayed imaging, there is no urothelial wall thickening and there are no filling defects in the opacified portions of the bilateral collecting systems or ureters. Stomach/Bowel: Stomach is within normal limits. No evidence of bowel wall thickening or dilatation. Colonic diverticulosis. Slightly hypertrophic  ileocecal valve. Appendix appears normal. Vascular/Lymphatic: No abdominal aorta or iliac aneurysm. Moderate atherosclerotic plaque of the aorta and its branches. No abdominal, pelvic, or inguinal lymphadenopathy. Reproductive: Status post hysterectomy. No adnexal masses. Other: No intraperitoneal free fluid. No intraperitoneal free gas. No organized fluid collection. Musculoskeletal: No abdominal wall hernia or abnormality. No suspicious lytic or blastic osseous lesions. No acute displaced fracture. Grade 1 anterolisthesis of L4 on L5. Multilevel degenerative change of the spine. Bilateral hip degenerative changes. IMPRESSION: 1. Moderate to large volume hiatal hernia containing the majority of the stomach. 2. Hepatic steatosis. 3. Colonic diverticulosis with no acute diverticulitis. 4.  Aortic Atherosclerosis (ICD10-I70.0). Electronically Signed   By: Morgane  Naveau M.D.   On: 07/03/2023 19:34   DG Chest Port 1 View Result Date: 07/03/2023 CLINICAL DATA:  Right-sided flank pain, initial encounter EXAM: PORTABLE CHEST 1 VIEW COMPARISON:  01/18/2019 FINDINGS: Cardiac shadow is within normal limits. Lungs are well aerated bilaterally. No focal infiltrate or effusion is seen. Postsurgical changes in the cervical spine are seen. IMPRESSION: No acute abnormality noted. Electronically Signed   By: Oneil Devonshire M.D.   On: 07/03/2023 18:15   US  Abdomen Limited RUQ (LIVER/GB) Result Date: 07/03/2023 CLINICAL DATA:  Right upper quadrant pain for 1 day EXAM:  ULTRASOUND ABDOMEN LIMITED RIGHT UPPER QUADRANT COMPARISON:  07/16/2020 FINDINGS: Gallbladder: No gallstones or wall thickening visualized. No sonographic Murphy sign noted by sonographer. Common bile duct: Diameter: 3 mm Liver: Increased in echogenicity and stable from the prior exam. No focal mass is noted. Portal vein is patent on color Doppler imaging with normal direction of blood flow towards the liver. Other: None. IMPRESSION: Increased echogenicity within the liver likely related to fatty infiltration. No acute abnormality noted. Electronically Signed   By: Oneil Devonshire M.D.   On: 07/03/2023 18:14    Microbiology: Results for orders placed or performed during the hospital encounter of 12/09/19  Resp Panel by RT-PCR (Flu A&B, Covid) Nasopharyngeal Swab     Status: None   Collection Time: 12/09/19  3:10 PM   Specimen: Nasopharyngeal Swab; Nasopharyngeal(NP) swabs in vial transport medium  Result Value Ref Range Status   SARS Coronavirus 2 by RT PCR NEGATIVE NEGATIVE Final    Comment: (NOTE) SARS-CoV-2 target nucleic acids are NOT DETECTED.  The SARS-CoV-2 RNA is generally detectable in upper respiratory specimens during the acute phase of infection. The lowest concentration of SARS-CoV-2 viral copies this assay can detect is 138 copies/mL. A negative result does not preclude SARS-Cov-2 infection and should not be used as the sole basis for treatment or other patient management decisions. A negative result may occur with  improper specimen collection/handling, submission of specimen other than nasopharyngeal swab, presence of viral mutation(s) within the areas targeted by this assay, and inadequate number of viral copies(<138 copies/mL). A negative result must be combined with clinical observations, patient history, and epidemiological information. The expected result is Negative.  Fact Sheet for Patients:  BloggerCourse.com  Fact Sheet for Healthcare  Providers:  SeriousBroker.it  This test is no t yet approved or cleared by the [place]  FDA and  has been authorized for detection and/or diagnosis of SARS-CoV-2 by FDA under an Emergency Use Authorization (EUA). This EUA will remain  in effect (meaning this test can be used) for the duration of the COVID-19 declaration under Section 564(b)(1) of the Act, 21 U.S.C.section 360bbb-3(b)(1), unless the authorization is terminated  or revoked sooner.       Influenza A by PCR NEGATIVE  NEGATIVE Final   Influenza B by PCR NEGATIVE NEGATIVE Final    Comment: (NOTE) The Xpert Xpress SARS-CoV-2/FLU/RSV plus assay is intended as an aid in the diagnosis of influenza from Nasopharyngeal swab specimens and should not be used as a sole basis for treatment. Nasal washings and aspirates are unacceptable for Xpert Xpress SARS-CoV-2/FLU/RSV testing.  Fact Sheet for Patients: BloggerCourse.com  Fact Sheet for Healthcare Providers: SeriousBroker.it  This test is not yet approved or cleared by the [place]  FDA and has been authorized for detection and/or diagnosis of SARS-CoV-2 by FDA under an Emergency Use Authorization (EUA). This EUA will remain in effect (meaning this test can be used) for the duration of the COVID-19 declaration under Section 564(b)(1) of the Act, 21 U.S.C. section 360bbb-3(b)(1), unless the authorization is terminated or revoked.  Performed at [facility], [address] [place], [place] [address]     Labs: CBC: Recent Labs  Lab 07/03/23 1745 07/03/23 2330 07/04/23 0559  WBC 8.1 6.9 5.4  NEUTROABS 5.6  --   --   HGB 13.7 12.7 12.6  HCT 40.9 38.0 39.2  MCV 99.3 99.5 101.8*  PLT 234 210 202   Basic Metabolic Panel: Recent Labs  Lab 07/03/23 1745 07/03/23 2330 07/04/23 0559  NA 141  --  139  K 3.6  --  3.3*  CL 107  --  108  CO2 23  --  23  GLUCOSE  108*  --  103*  BUN 12  --  11  CREATININE 0.65 0.59 0.48  CALCIUM 9.0  --  8.4*   Liver Function Tests: Recent Labs  Lab 07/03/23 1745 07/04/23 0559  AST 23 18  ALT 20 19  ALKPHOS 66 59  BILITOT 1.0 1.2  PROT 7.1 6.0*  ALBUMIN 4.1 3.4*   CBG: No results for input(s): GLUCAP in the last 168 hours.  Discharge time spent: {LESS THAN/GREATER UYJW:73611} 30 minutes.  Signed: Elgin Lam, MD [facility] 07/04/2023

## 2023-07-04 NOTE — Evaluation (Signed)
 Physical Therapy Evaluation Patient Details Name: Susan Li MRN: [id number] DOB: 08/24/1940 Today's Date: 07/04/2023  History of Present Illness  Patient is a 83 year old female who presented on 6/21 with R side flank pain. Patient was found to have 7th to 9th rib pain. PMH: fibromyalgia, asthma, arthritis, DJD of spine.  Clinical Impression  Patient evaluated by PT with no further acute PT needs identified. Pt currently mobilizing without physical assist and min cues for safety and the patient has no further questions. Patient needs 24/7 supervision at home for memory deficits to maximize safety at home including ADLs and medication management tasks.  Pt with no equipment needs with home RW in room.  PT service to sign off at this time and refer to mobility team for remainder of this stay.        If plan is discharge home, recommend the following: Assistance with cooking/housework;Assist for transportation;Help with stairs or ramp for entrance;Supervision due to cognitive status   Can travel by private vehicle        Equipment Recommendations None recommended by PT  Recommendations for Other Services       Functional Status Assessment Patient has had a recent decline in their functional status and demonstrates the ability to make significant improvements in function in a reasonable and predictable amount of time.     Precautions / Restrictions Precautions Precautions: Fall Precaution/Restrictions Comments: rib fractures 7-9 R side Restrictions Weight Bearing Restrictions Per Provider Order: No      Mobility  Bed Mobility Overal bed mobility: Needs Assistance Bed Mobility: Supine to Sit, Sit to Supine     Supine to sit: Supervision Sit to supine: Supervision   General bed mobility comments: For safety only; no physical assist with pt performing log roll and side ly<>sit unassisted and self cueing.  Pt performed x2 during session    Transfers Overall transfer level:  Needs assistance Equipment used: Rolling walker (2 wheels) Transfers: Sit to/from Stand Sit to Stand: Supervision           General transfer comment: for safety only    Ambulation/Gait Ambulation/Gait assistance: Supervision Gait Distance (Feet): 150 Feet Assistive device: Rolling walker (2 wheels) Gait Pattern/deviations: Step-through pattern, Decreased step length - right, Decreased step length - left Gait velocity: decreased but functional pace     General Gait Details: min cues for position from [employer]            Wheelchair Mobility     Tilt Bed    Modified Rankin (Stroke Patients Only)       Balance Overall balance assessment: Mild deficits observed, not formally tested                                           Pertinent Vitals/Pain Pain Assessment Pain Assessment: Faces Faces Pain Scale: Hurts even more Pain Location: R side flank Pain Descriptors / Indicators: Constant Pain Intervention(s): Limited activity within patient's tolerance, Monitored during session, Premedicated before session    Home Living Family/patient expects to be discharged to:: Private residence Living Arrangements: Alone Available Help at Discharge: Family;Friend(s);Available PRN/intermittently Type of Home: Other(Comment) Home Access: Level entry       Home Layout: One level Home Equipment: Agricultural consultant (2 wheels)      Prior Function Prior Level of Function : Needs assist  Mobility Comments: Pt states has RW but has not used it in a long time  but RW from hospital was in room with her ADLs Comments: has assistance with groceries and IADLs from friend.     Extremity/Trunk Assessment   Upper Extremity Assessment Upper Extremity Assessment: Overall WFL for tasks assessed    Lower Extremity Assessment Lower Extremity Assessment: Overall WFL for tasks assessed    Cervical / Trunk Assessment Cervical / Trunk Assessment:  Normal  Communication   Communication Communication: No apparent difficulties    Cognition Arousal: Alert Behavior During Therapy: WFL for tasks assessed/performed   PT - Cognitive impairments: Memory                       PT - Cognition Comments: Less than 10 min after return to bed, pt insisting on getting up again so we could assess how she moved.  Pt had completely forgotten that she had already ambulated 150' in the hall Following commands: Impaired Following commands impaired: Follows one step commands with increased time     Cueing Cueing Techniques: Verbal cues     General Comments      Exercises     Assessment/Plan    PT Assessment Patient does not need any further PT services  PT Problem List         PT Treatment Interventions      PT Goals (Current goals can be found in the Care Plan section)  Acute Rehab PT Goals Patient Stated Goal: HOME; less pain PT Goal Formulation: All assessment and education complete, DC therapy    Frequency       Co-evaluation PT/OT/SLP Co-Evaluation/Treatment: Yes Reason for Co-Treatment: For patient/therapist safety PT goals addressed during session: Mobility/safety with mobility OT goals addressed during session: ADL's and self-care       AM-PAC PT 6 Clicks Mobility  Outcome Measure Help needed turning from your back to your side while in a flat bed without using bedrails?: None Help needed moving from lying on your back to sitting on the side of a flat bed without using bedrails?: None Help needed moving to and from a bed to a chair (including a wheelchair)?: None Help needed standing up from a chair using your arms (e.g., wheelchair or bedside chair)?: None Help needed to walk in hospital room?: None Help needed climbing 3-5 steps with a railing? : A Little 6 Click Score: 23    End of Session Equipment Utilized During Treatment: Gait belt Activity Tolerance: Patient tolerated treatment well;Patient  limited by pain Patient left: in bed;with call bell/phone within reach Nurse Communication: Mobility status PT Visit Diagnosis: Difficulty in walking, not elsewhere classified (R26.2)    Time: 8691-8659 PT Time Calculation (min) (ACUTE ONLY): 32 min   Charges:   PT Evaluation $PT Eval Low Complexity: 1 Low   PT General Charges $$ ACUTE PT VISIT: 1 Visit         Susan Li PT Acute Rehabilitation Services Pager [phone] Office [phone]   Susan Li 07/04/2023, 2:54 PM

## 2023-07-04 NOTE — Discharge Instructions (Signed)
 Susan Li,  In the hospital with right back/flank pain.  Your workup revealed that you have multiple rib fractures of ribs 7, 8, and 9.  It seems that the most likely cause of this is from your recent coughing fit, since you have had no recent falls or direct traumas.  Unfortunately, time is only thing that will help with healing in the situation.  I will discharge her with some pain medication to help with your symptom management.  Is important that you do not try to decrease your breath intake (as this can lead to pneumonia and future.

## 2023-07-04 NOTE — Progress Notes (Incomplete)
 PROGRESS NOTE    Susan Li  FMW:[id number] DOB: Apr 09, 1940 DOA: 07/03/2023 PCP: Dwight Trula SQUIBB, MD   Brief Narrative: Susan Li is a 83 y.o. female with a history of ***.  Patient presented secondary to ***. Workup significant for *** rib fractures.   Assessment/Plan:  Right *** rib fractures ***  Mild intermittent asthma Patient is on Symbicort   Fibromyalgia Noted. Patient is not on medication management.  GERD Patient is on Prilosec as an outpatient. -Protonix while inpatient   DVT prophylaxis: Subcutaneous heparin Code Status:   Code Status: Full Code Family Communication: *** Disposition Plan: ***   Consultants:  None  Procedures:  None  Antimicrobials: None    Subjective: ***  Objective: BP (!) 164/94   Pulse 83   Temp 98 F (36.7 C)   Resp 11   SpO2 96%   Examination:  General exam: Appears calm and comfortable *** Respiratory system: Clear to auscultation. Respiratory effort normal. Cardiovascular system: S1 & S2 heard, RRR. No murmurs, rubs, gallops or clicks. Gastrointestinal system: Abdomen is nondistended, soft and nontender. No organomegaly or masses felt. Normal bowel sounds heard. Central nervous system: Alert and oriented. No focal neurological deficits. Musculoskeletal: No edema. No calf tenderness Skin: No cyanosis. No rashes Psychiatry: Judgement and insight appear normal. Mood & affect appropriate.    Data Reviewed: I have personally reviewed following labs and imaging studies   Last CBC Lab Results  Component Value Date   WBC 5.4 07/04/2023   HGB 12.6 07/04/2023   HCT 39.2 07/04/2023   MCV 101.8 (H) 07/04/2023   MCH 32.7 07/04/2023   RDW 14.3 07/04/2023   PLT 202 07/04/2023     Last metabolic panel Lab Results  Component Value Date   GLUCOSE 103 (H) 07/04/2023   NA 139 07/04/2023   K 3.3 (L) 07/04/2023   CL 108 07/04/2023   CO2 23 07/04/2023   BUN 11 07/04/2023   CREATININE 0.48 07/04/2023    GFRNONAA >60 07/04/2023   CALCIUM 8.4 (L) 07/04/2023   PROT 6.0 (L) 07/04/2023   ALBUMIN 3.4 (L) 07/04/2023   LABGLOB 2.0 03/08/2023   BILITOT 1.2 07/04/2023   ALKPHOS 59 07/04/2023   AST 18 07/04/2023   ALT 19 07/04/2023   ANIONGAP 8 07/04/2023     Creatinine Clearance: CrCl cannot be calculated (Unknown ideal weight.).  No results found for this or any previous visit (from the past 240 hours).    Radiology Studies: CT Angio Chest Pulmonary Embolism (PE) W or WO Contrast Result Date: 07/04/2023 CLINICAL DATA:  Right-sided chest pain EXAM: CT ANGIOGRAPHY CHEST WITH CONTRAST TECHNIQUE: Multidetector CT imaging of the chest was performed using the standard protocol during bolus administration of intravenous contrast. Multiplanar CT image reconstructions and MIPs were obtained to evaluate the vascular anatomy. RADIATION DOSE REDUCTION: This exam was performed according to the departmental dose-optimization program which includes automated exposure control, adjustment of the mA and/or kV according to patient size and/or use of iterative reconstruction technique. CONTRAST:  75mL OMNIPAQUE IOHEXOL 350 MG/ML SOLN COMPARISON:  Chest x-ray from the previous day. FINDINGS: Cardiovascular: Atherosclerotic calcifications are noted. No aneurysmal dilatation or dissection is seen. No cardiac enlargement is noted. Mild coronary calcifications are seen. Pulmonary artery shows a normal branching pattern bilaterally. No intraluminal filling defect is identified. Mediastinum/Nodes: Thoracic inlet is within normal limits. No hilar or mediastinal adenopathy is noted. The esophagus as visualized is within normal limits. Moderate-sized sliding-type hiatal hernia is noted. Lungs/Pleura: Lungs are  well aerated bilaterally. Mild dependent atelectatic changes are seen on the left. No pneumothorax or sizable effusion is seen. Upper Abdomen: Within normal limits. Musculoskeletal: Degenerative changes of the thoracic spine  are noted. Postsurgical changes in the cervical spine are seen. Mildly displaced right seventh through ninth rib fractures are seen. Review of the MIP images confirms the above findings. IMPRESSION: No evidence of pulmonary emboli. Mild dependent atelectatic changes. Fractures of the seventh through ninth ribs on the right without complicating factors. Electronically Signed   By: Oneil Devonshire M.D.   On: 07/04/2023 02:21   CT THORACIC SPINE WO CONTRAST Result Date: 07/03/2023 CLINICAL DATA:  Trauma EXAM: CT Thoracic and Lumbar spine without contrast TECHNIQUE: Multiplanar CT images of the thoracic and lumbar spine were reconstructed from contemporary CT of the Chest, Abdomen, and Pelvis. RADIATION DOSE REDUCTION: This exam was performed according to the departmental dose-optimization program which includes automated exposure control, adjustment of the mA and/or kV according to patient size and/or use of iterative reconstruction technique. CONTRAST:  No additional contrast administered COMPARISON:  None Available. FINDINGS: CT THORACIC SPINE FINDINGS Alignment: Normal. Vertebrae: No acute fracture or focal pathologic process. Paraspinal and other soft tissues: Hiatal hernia Disc levels: No spinal canal stenosis CT LUMBAR SPINE FINDINGS Segmentation: 5 lumbar type vertebrae. Alignment: Grade 1 retrolisthesis at L1-2 grade 1 anterolisthesis at L4-5. Vertebrae: No acute fracture. Paraspinal and other soft tissues: Calcific aortic atherosclerosis. Disc levels: No lumbar spinal canal stenosis. Multilevel moderate facet arthrosis of the lower lumbar spine. No neural impingement. IMPRESSION: 1. No acute fracture or traumatic listhesis of the thoracic or lumbar spine. 2. Hiatal hernia. Aortic Atherosclerosis (ICD10-I70.0). Electronically Signed   By: Franky Stanford M.D.   On: 07/03/2023 23:42   CT LUMBAR SPINE WO CONTRAST Result Date: 07/03/2023 CLINICAL DATA:  Trauma EXAM: CT Thoracic and Lumbar spine without contrast  TECHNIQUE: Multiplanar CT images of the thoracic and lumbar spine were reconstructed from contemporary CT of the Chest, Abdomen, and Pelvis. RADIATION DOSE REDUCTION: This exam was performed according to the departmental dose-optimization program which includes automated exposure control, adjustment of the mA and/or kV according to patient size and/or use of iterative reconstruction technique. CONTRAST:  No additional contrast administered COMPARISON:  None Available. FINDINGS: CT THORACIC SPINE FINDINGS Alignment: Normal. Vertebrae: No acute fracture or focal pathologic process. Paraspinal and other soft tissues: Hiatal hernia Disc levels: No spinal canal stenosis CT LUMBAR SPINE FINDINGS Segmentation: 5 lumbar type vertebrae. Alignment: Grade 1 retrolisthesis at L1-2 grade 1 anterolisthesis at L4-5. Vertebrae: No acute fracture. Paraspinal and other soft tissues: Calcific aortic atherosclerosis. Disc levels: No lumbar spinal canal stenosis. Multilevel moderate facet arthrosis of the lower lumbar spine. No neural impingement. IMPRESSION: 1. No acute fracture or traumatic listhesis of the thoracic or lumbar spine. 2. Hiatal hernia. Aortic Atherosclerosis (ICD10-I70.0). Electronically Signed   By: Franky Stanford M.D.   On: 07/03/2023 23:42   CT ABDOMEN PELVIS W CONTRAST Result Date: 07/03/2023 CLINICAL DATA:  Abdominal pain, acute, nonlocalized Abdominal/flank pain, stone suspected ruq/r flank EXAM: CT ABDOMEN AND PELVIS WITH CONTRAST TECHNIQUE: Multidetector CT imaging of the abdomen and pelvis was performed using the standard protocol following bolus administration of intravenous contrast. RADIATION DOSE REDUCTION: This exam was performed according to the departmental dose-optimization program which includes automated exposure control, adjustment of the mA and/or kV according to patient size and/or use of iterative reconstruction technique. CONTRAST:  OMNIPAQUE IOHEXOL 300 MG/ML  SOLN COMPARISON:  Us  abd  07/03/23 FINDINGS:  Lower chest: No acute abnormality. Moderate to large volume hiatal hernia containing the majority of the stomach. Hepatobiliary: The hepatic parenchyma is diffusely hypodense compared to the splenic parenchyma consistent with fatty infiltration. No focal liver abnormality. No gallstones, gallbladder wall thickening, or pericholecystic fluid. No biliary dilatation. Pancreas: No focal lesion. Normal pancreatic contour. No surrounding inflammatory changes. No main pancreatic ductal dilatation. Spleen: Normal in size. Focal hypodensity of the spleen-nonspecific. Adrenals/Urinary Tract: No adrenal nodule bilaterally. Bilateral kidneys enhance symmetrically. Simple free fluid peripelvic cysts. Simple renal cysts, in the absence of clinically indicated signs/symptoms, require no independent follow-up. No hydronephrosis. No hydroureter. The urinary bladder is unremarkable. On delayed imaging, there is no urothelial wall thickening and there are no filling defects in the opacified portions of the bilateral collecting systems or ureters. Stomach/Bowel: Stomach is within normal limits. No evidence of bowel wall thickening or dilatation. Colonic diverticulosis. Slightly hypertrophic ileocecal valve. Appendix appears normal. Vascular/Lymphatic: No abdominal aorta or iliac aneurysm. Moderate atherosclerotic plaque of the aorta and its branches. No abdominal, pelvic, or inguinal lymphadenopathy. Reproductive: Status post hysterectomy. No adnexal masses. Other: No intraperitoneal free fluid. No intraperitoneal free gas. No organized fluid collection. Musculoskeletal: No abdominal wall hernia or abnormality. No suspicious lytic or blastic osseous lesions. No acute displaced fracture. Grade 1 anterolisthesis of L4 on L5. Multilevel degenerative change of the spine. Bilateral hip degenerative changes. IMPRESSION: 1. Moderate to large volume hiatal hernia containing the majority of the stomach. 2. Hepatic steatosis.  3. Colonic diverticulosis with no acute diverticulitis. 4.  Aortic Atherosclerosis (ICD10-I70.0). Electronically Signed   By: Morgane  Naveau M.D.   On: 07/03/2023 19:34   DG Chest Port 1 View Result Date: 07/03/2023 CLINICAL DATA:  Right-sided flank pain, initial encounter EXAM: PORTABLE CHEST 1 VIEW COMPARISON:  01/18/2019 FINDINGS: Cardiac shadow is within normal limits. Lungs are well aerated bilaterally. No focal infiltrate or effusion is seen. Postsurgical changes in the cervical spine are seen. IMPRESSION: No acute abnormality noted. Electronically Signed   By: Oneil Devonshire M.D.   On: 07/03/2023 18:15   US  Abdomen Limited RUQ (LIVER/GB) Result Date: 07/03/2023 CLINICAL DATA:  Right upper quadrant pain for 1 day EXAM: ULTRASOUND ABDOMEN LIMITED RIGHT UPPER QUADRANT COMPARISON:  07/16/2020 FINDINGS: Gallbladder: No gallstones or wall thickening visualized. No sonographic Murphy sign noted by sonographer. Common bile duct: Diameter: 3 mm Liver: Increased in echogenicity and stable from the prior exam. No focal mass is noted. Portal vein is patent on color Doppler imaging with normal direction of blood flow towards the liver. Other: None. IMPRESSION: Increased echogenicity within the liver likely related to fatty infiltration. No acute abnormality noted. Electronically Signed   By: Oneil Devonshire M.D.   On: 07/03/2023 18:14      LOS: 1 day    Elgin Lam, MD [facility] 07/04/2023, 7:40 AM   If 7PM-7AM, please contact night-coverage [email or web address]

## 2023-07-04 NOTE — Evaluation (Signed)
 Occupational Therapy Evaluation Patient Details Name: Susan Li MRN: [id number] DOB: 1940-11-04 Today's Date: 07/04/2023   History of Present Illness   Patient is a 83 year old female who presented on 6/21 with R side flank pain. Patient was found to have 7th to 9th rib pain. PMH: fibromyalgia, asthma, arthritis, DJD of spine.     Clinical Impressions Patient evaluated by Occupational Therapy with no further acute OT needs identified. All education has been completed and the patient has no further questions. Patient needs 24/7 supervision at home for memory deficits to be able to safely engage in ADLs and medication management tasks.  See below for any follow-up Occupational Therapy or equipment needs. OT is signing off. Thank you for this referral.      If plan is discharge home, recommend the following:   Supervision due to cognitive status;Assistance with cooking/housework;Assist for transportation;Help with stairs or ramp for entrance;Direct supervision/assist for medications management     Functional Status Assessment   Patient has had a recent decline in their functional status and demonstrates the ability to make significant improvements in function in a reasonable and predictable amount of time.     Equipment Recommendations   None recommended by OT      Precautions/Restrictions   Precautions Precautions: Fall Precaution/Restrictions Comments: rib fractures 7-9 R side     Mobility Bed Mobility Overal bed mobility: Needs Assistance Bed Mobility: Supine to Sit, Sit to Supine     Supine to sit: Supervision Sit to supine: Supervision   General bed mobility comments: x2 during session             Balance Overall balance assessment: No apparent balance deficits (not formally assessed)       ADL either performed or assessed with clinical judgement   ADL Overall ADL's : Needs assistance/impaired Eating/Feeding: Supervision/ safety;Sitting    Grooming: Sitting;Set up   Upper Body Bathing: Sitting;Set up   Lower Body Bathing: Contact guard assist;Sitting/lateral leans Lower Body Bathing Details (indicate cue type and reason): able to bring legs to lap with increased time. Upper Body Dressing : Sitting;Set up   Lower Body Dressing: Sitting/lateral leans;Contact guard assist   Toilet Transfer: Supervision/safety;Set up;Ambulation;Rolling walker (2 wheels) Toilet Transfer Details (indicate cue type and reason): patient was able to complete with supervision with increased time. patient Toileting- Architect and Hygiene: Sit to/from stand;Set up;Supervision/safety         General ADL Comments: patient was educated on using reacher to collect items out of reach. patient reported she had one at home and she would find it. concerns over memory brought up to MD and nurse. MD reporting that dauhgter is aware of memory issues at this time.     Vision   Vision Assessment?: No apparent visual deficits            Pertinent Vitals/Pain Pain Assessment Pain Assessment: Faces Faces Pain Scale: Hurts even more Pain Location: R side flank Pain Descriptors / Indicators: Constant Pain Intervention(s): Limited activity within patient's tolerance, Monitored during session, Repositioned, Premedicated before session     Extremity/Trunk Assessment Upper Extremity Assessment Upper Extremity Assessment: Overall WFL for tasks assessed   Lower Extremity Assessment Lower Extremity Assessment: Defer to PT evaluation   Cervical / Trunk Assessment Cervical / Trunk Assessment: Normal   Communication     Cognition Arousal: Alert Behavior During Therapy: WFL for tasks assessed/performed Cognition: History of cognitive impairments  OT - Cognition Comments: per daughter report. patient noted to have memory issues during session.                 Following commands: Impaired Following commands impaired:  Follows one step commands with increased time                Home Living Family/patient expects to be discharged to:: Private residence Living Arrangements: Alone Available Help at Discharge: Family;Friend(s);Available PRN/intermittently Type of Home: Other(Comment) (condo) Home Access: Level entry     Home Layout: One level     Bathroom Shower/Tub: Walk-in shower         Home Equipment: Agricultural consultant (2 wheels)          Prior Functioning/Environment Prior Level of Function : Needs assist               ADLs Comments: has assistance with groceries and IADLs from friend.    OT Problem List: Decreased activity tolerance;Impaired balance (sitting and/or standing);Decreased coordination;Decreased safety awareness;Decreased knowledge of precautions;Decreased knowledge of use of DME or AE   OT Treatment/Interventions: Self-care/ADL training;Energy conservation;Therapeutic exercise;Therapeutic activities;Patient/family education      OT Goals(Current goals can be found in the care plan section)   Acute Rehab OT Goals Patient Stated Goal: to get back home today OT Goal Formulation: Patient unable to participate in goal setting Time For Goal Achievement: 07/18/23 Potential to Achieve Goals: Fair   OT Frequency:  Min 2X/week       AM-PAC OT 6 Clicks Daily Activity     Outcome Measure Help from another person eating meals?: None Help from another person taking care of personal grooming?: A Little Help from another person toileting, which includes using toliet, bedpan, or urinal?: A Little Help from another person bathing (including washing, rinsing, drying)?: A Little Help from another person to put on and taking off regular upper body clothing?: A Little Help from another person to put on and taking off regular lower body clothing?: A Little 6 Click Score: 19   End of Session Equipment Utilized During Treatment: Gait belt;Rolling walker (2 wheels) Nurse  Communication: Other (comment) (memory concerns)  Activity Tolerance: Patient tolerated treatment well Patient left: in bed;with call bell/phone within reach (in ED)  OT Visit Diagnosis: Other abnormalities of gait and mobility (R26.89);Muscle weakness (generalized) (M62.81)                Time:  -    Charges:  OT General Charges $OT Visit: 1 Visit OT Evaluation $OT Eval Low Complexity: 1 Low  Dorina Ribaudo OTR/L, MS Acute Rehabilitation Department Office# [phone]   Geofm CHRISTELLA Dance 07/04/2023, 2:21 PM

## 2023-07-04 NOTE — ED Notes (Signed)
 PT and OT recommend pt not be sent home alone, due to possible memory impairment. No impairments noted at this time. I spoke with pt about how the therapy session went and pt reports it went well. Pt stated daughter will be with her post discharge. Pt report no past Hx of falls and feels safe being discharged home alone.

## 2023-07-05 DIAGNOSIS — S2249XA Multiple fractures of ribs, unspecified side, initial encounter for closed fracture: Secondary | ICD-10-CM

## 2023-07-05 NOTE — Hospital Course (Addendum)
 Susan Li is a 83 y.o. female with a history of asthma, arthritis, fibromyalgia, right-sided sciatica.  Patient presented secondary to right flank pain with workup significant for multiple right-sided rib fractures. Patient managed symptomatically with analgesics.

## 2023-07-12 DIAGNOSIS — K449 Diaphragmatic hernia without obstruction or gangrene: Secondary | ICD-10-CM | POA: Diagnosis not present

## 2023-07-12 DIAGNOSIS — K760 Fatty (change of) liver, not elsewhere classified: Secondary | ICD-10-CM | POA: Diagnosis not present

## 2023-07-12 DIAGNOSIS — R635 Abnormal weight gain: Secondary | ICD-10-CM | POA: Diagnosis not present

## 2023-07-12 DIAGNOSIS — S2241XD Multiple fractures of ribs, right side, subsequent encounter for fracture with routine healing: Secondary | ICD-10-CM | POA: Diagnosis not present

## 2023-07-19 DIAGNOSIS — M542 Cervicalgia: Secondary | ICD-10-CM | POA: Diagnosis not present

## 2023-07-19 DIAGNOSIS — M545 Low back pain, unspecified: Secondary | ICD-10-CM | POA: Diagnosis not present

## 2023-08-02 DIAGNOSIS — M5412 Radiculopathy, cervical region: Secondary | ICD-10-CM | POA: Diagnosis not present

## 2023-08-02 DIAGNOSIS — M5416 Radiculopathy, lumbar region: Secondary | ICD-10-CM | POA: Diagnosis not present

## 2023-08-31 ENCOUNTER — Encounter: Payer: Self-pay | Admitting: Allergy and Immunology

## 2023-08-31 ENCOUNTER — Other Ambulatory Visit: Payer: Self-pay

## 2023-08-31 ENCOUNTER — Ambulatory Visit (INDEPENDENT_AMBULATORY_CARE_PROVIDER_SITE_OTHER): Payer: Medicare Other | Admitting: Allergy and Immunology

## 2023-08-31 VITALS — BP 100/80 | HR 95 | Temp 98.2°F | Resp 18 | Ht 62.5 in | Wt 163.4 lb

## 2023-08-31 DIAGNOSIS — J4540 Moderate persistent asthma, uncomplicated: Secondary | ICD-10-CM

## 2023-08-31 DIAGNOSIS — K219 Gastro-esophageal reflux disease without esophagitis: Secondary | ICD-10-CM | POA: Diagnosis not present

## 2023-08-31 DIAGNOSIS — J3089 Other allergic rhinitis: Secondary | ICD-10-CM

## 2023-08-31 DIAGNOSIS — H04123 Dry eye syndrome of bilateral lacrimal glands: Secondary | ICD-10-CM | POA: Diagnosis not present

## 2023-08-31 DIAGNOSIS — R232 Flushing: Secondary | ICD-10-CM

## 2023-08-31 MED ORDER — FAMOTIDINE 20 MG PO TABS: 20.0000 mg | ORAL_TABLET | ORAL | 1 refills | Status: AC | PRN

## 2023-08-31 MED ORDER — BUDESONIDE-FORMOTEROL FUMARATE 160-4.5 MCG/ACT IN AERO: 2.0000 | INHALATION_SPRAY | Freq: Two times a day (BID) | RESPIRATORY_TRACT | 1 refills | Status: DC

## 2023-08-31 MED ORDER — ALBUTEROL SULFATE HFA 108 (90 BASE) MCG/ACT IN AERS: 2.0000 | INHALATION_SPRAY | Freq: Four times a day (QID) | RESPIRATORY_TRACT | 1 refills | Status: AC | PRN

## 2023-08-31 MED ORDER — CETIRIZINE HCL 10 MG PO TABS: 10.0000 mg | ORAL_TABLET | Freq: Every day | ORAL | 1 refills | Status: AC | PRN

## 2023-08-31 MED ORDER — OMEPRAZOLE 20 MG PO CPDR: 20.0000 mg | DELAYED_RELEASE_CAPSULE | Freq: Every morning | ORAL | 1 refills | Status: AC

## 2023-08-31 MED ORDER — TRIAMCINOLONE ACETONIDE 55 MCG/ACT NA AERO: 1.0000 | INHALATION_SPRAY | Freq: Two times a day (BID) | NASAL | 1 refills | Status: AC

## 2023-08-31 NOTE — Patient Instructions (Addendum)
  1. Continue Symbicort  160 mg  2 puffs 2 times a day   2. Continue omeprazole  20 mg 1 time per day  3. Continue the following if needed:  A.  OTC Zyrtec  1 time per day   B.  Albuterol  2 puffs every 4-6 hours    C.  Wetting eye drops  D.  Nasacort  - 1 spray each nostril 1-2 time per day  E.  Famotidine  20 mg in evening    4. Return to clinic in 6 months or earlier if problem  5. Influenza = Tamiflu. Covid = Paxlovid  6. Low dose of estrogen??? Discuss with primary care doctor

## 2023-08-31 NOTE — Progress Notes (Unsigned)
 [place] [facility] [place] - [place] - [place] - [place]   Follow-up Note  Referring Provider: Dwight Trula SQUIBB, MD Primary Provider: Dwight Trula SQUIBB, MD Date of Office Visit: 08/31/2023  Subjective:   Susan Li (DOB: Dec 27, 1940) is a 83 y.o. female who [facility] in re-evaluation of the following:  HPI: Annaliyah presents to this clinic in evaluation of asthma, allergic rhinitis, LPR, dry eye, and flushing.  I last saw her in this clinic 02 March 2023.  Her asthma has been under pretty good control at this point in time.  She does not really exert herself to any significant amount because of her lower back problem and the unsteadiness issue she has that requires her to use a walker.  Her requirement for short acting bronchodilator is relatively rare averaging out to less than 1 time per week while she  She has had very little problems with her nose while occasionally using an antihistamine.  She has not required a systemic steroid or an antibiotic for any type of airway issue.  Her reflux is under very good control with just using omeprazole  at this point.  When I last saw her in this clinic she was having some flushing across her face and sometimes her body and we worked her up for flushing disorder which was negative.  Allergies as of 08/31/2023       Reactions   Sertraline Hcl Diarrhea   Zolpidem Tartrate Other (See Comments)   Other reaction(s): nocturnal syncope   Morphine And Codeine Itching   Itching nose   Morphine Sulfate Itching        Medication List    albuterol  108 (90 Base) MCG/ACT inhaler Commonly known as: Ventolin  HFA Inhale 2 puffs into the lungs every 6 (six) hours as needed for wheezing or shortness of breath.   BIOTIN PO Take 1 tablet by mouth daily.   budesonide -formoterol  160-4.5 MCG/ACT inhaler Commonly known as: SYMBICORT  Inhale 2 puffs into the lungs 2 (two) times daily.   cetirizine  10 MG  tablet Commonly known as: ZYRTEC  Take 1 tablet (10 mg total) by mouth daily as needed for allergies (Can take an extra dose during flare ups.).   cholecalciferol 25 MCG (1000 UNIT) tablet Commonly known as: VITAMIN D3 Take 1 tablet by mouth daily.   diclofenac  Sodium 1 % Gel Commonly known as: VOLTAREN  Apply 4 g topically 4 (four) times daily. Apply to right back/side of chest over rib pain   famotidine  20 MG tablet Commonly known as: Pepcid  Take 1 tablet (20 mg total) by mouth as needed for heartburn or indigestion. 1 tablet by mouth in the evening if needed.   fexofenadine 60 MG tablet Commonly known as: ALLEGRA Take 60 mg by mouth daily as needed for allergies or rhinitis.   melatonin 5 MG Tabs Take 5 mg by mouth at bedtime as needed.   omeprazole  20 MG capsule Commonly known as: PRILOSEC Take 1 capsule (20 mg total) by mouth in the morning.   oxyCODONE  5 MG immediate release tablet Commonly known as: Oxy IR/ROXICODONE  Take 1 tablet (5 mg total) by mouth every 4 (four) hours as needed for severe pain (pain score 7-10).   vitamin C with rose hips 500 MG tablet Take 500 mg by mouth daily.    Past Medical History:  Diagnosis Date   Arthritis    hips,backs,hands; osteoarthritis   Asthma    Symbicort  as needed 3x week  Bursitis    Fibromyalgia    GERD (gastroesophageal reflux disease)    Insomnia    Sciatica of right side    Vitamin D deficiency     Past Surgical History:  Procedure Laterality Date   ABDOMINAL HYSTERECTOMY  2000   Buninonectomy  2008   CERVICAL DISC SURGERY  2004   COLONOSCOPY WITH PROPOFOL  N/A 04/25/2012   Procedure: COLONOSCOPY WITH PROPOFOL ;  Surgeon: Gladis MARLA Louder, MD;  Location: [facility];  Service: Endoscopy;  Laterality: N/A;   LASIK  2004   left shoulder surgery     RESECTION DISTAL CLAVICAL Left 08/24/2014   Procedure: RESECTION DISTAL CLAVICAL;  Surgeon: Maude Herald, MD;  Location: [facility];  Service:  Orthopedics;  Laterality: Left;   SHOULDER ARTHROSCOPY WITH SUBACROMIAL DECOMPRESSION Left 08/24/2014   Procedure: ARTHROSCOPY  LEFT SHOULDER, SUBACROMIAL DECOMPRESSION, DISTAL CLAVICLE RESECTION AND DEBRIDEMENT;  Surgeon: Maude Herald, MD;  Location: [facility];  Service: Orthopedics;  Laterality: Left;   Tendernitis Surgery  99    Review of systems negative except as noted in HPI / PMHx or noted below:  Review of Systems  Constitutional: Negative.   HENT: Negative.    Eyes: Negative.   Respiratory: Negative.    Cardiovascular: Negative.   Gastrointestinal: Negative.   Genitourinary: Negative.   Musculoskeletal: Negative.   Skin: Negative.   Neurological: Negative.   Endo/Heme/Allergies: Negative.   Psychiatric/Behavioral: Negative.       Objective:   Vitals:   08/31/23 1329  BP: 100/80  Pulse: 95  Resp: 18  Temp: 98.2 F (36.8 C)  SpO2: 95%   Height: 5' 2.5 (158.8 cm)  Weight: 163 lb 6.4 oz (74.1 kg)   Physical Exam Constitutional:      Appearance: She is not diaphoretic.  HENT:     Head: Normocephalic.     Right Ear: External ear normal.     Left Ear: External ear normal.     Ears:     Comments: Hearing aids    Nose: Nose normal. No mucosal edema or rhinorrhea.     Mouth/Throat:     Pharynx: Uvula midline. No oropharyngeal exudate.  Eyes:     Conjunctiva/sclera: Conjunctivae normal.  Neck:     Thyroid : No thyromegaly.     Trachea: Trachea normal. No tracheal tenderness or tracheal deviation.  Cardiovascular:     Rate and Rhythm: Normal rate and regular rhythm.     Heart sounds: Normal heart sounds, S1 normal and S2 normal. No murmur heard. Pulmonary:     Effort: No respiratory distress.     Breath sounds: Normal breath sounds. No stridor. No wheezing or rales.  Lymphadenopathy:     Head:     Right side of head: No tonsillar adenopathy.     Left side of head: No tonsillar adenopathy.     Cervical: No cervical adenopathy.  Skin:     Findings: No erythema or rash.     Nails: There is no clubbing.  Neurological:     Mental Status: She is alert.     Diagnostics: Spirometry was performed and demonstrated an FEV1 of 1.17 at 62 % of predicted.  Results of blood test obtained 08 March 2023 identifies creatinine 0.65 mg/DL, AST 80L/O, ALT 80L/O, TSH 1.680 IU/ML, free T41.06 NG/DL, thyroid  peroxidase antibody 20U/ML, WBC 5.3, absolute eosinophil 300, absolute lymphocyte 1300, hemoglobin 14.4, platelets 304.  Results of a 24-hour urine collection obtained obtained 03 March 2023 identified 2 5 HIAA  2.5 mg / 24 hours   Assessment and Plan:   1. Asthma, moderate persistent, well-controlled   2. Other allergic rhinitis   3. LPRD (laryngopharyngeal reflux disease)   4. Dry eye syndrome of both eyes   5. Flushing     1. Continue Symbicort  160 mg  2 puffs 2 times a day   2. Continue omeprazole  20 mg 1 time per day  3. Continue the following if needed:  A.  OTC Zyrtec  1 time per day   B.  Albuterol  2 puffs every 4-6 hours    C.  Wetting eye drops  D.  Nasacort  - 1 spray each nostril 1-2 time per day  E.  Famotidine  20 mg in evening    4. Return to clinic in 6 months or earlier if problem  5. Influenza = Tamiflu. Covid = Paxlovid  6. Low dose of estrogen??? Discuss with primary care doctor  Caley appears to be doing pretty well with her airway at this point in time and were not really going to change any of her therapy and her reflux also appears to be under pretty good control.  She does have flushing and her blood test and urine test do not really identify a worrisome etiologic factor responsible for that flushing.  I have asked her to discuss with her primary care doctor about the possibility of maybe using a low-dose of estrogen to see if this does help with her flushing.  If she does well with the plan noted above I will see her back in this clinic in 6 months or earlier if there is a problem.    Camellia Denis,  MD Allergy  / Immunology [facility]

## 2023-09-01 ENCOUNTER — Encounter: Payer: Self-pay | Admitting: Allergy and Immunology

## 2023-09-27 DIAGNOSIS — M159 Polyosteoarthritis, unspecified: Secondary | ICD-10-CM | POA: Diagnosis not present

## 2023-09-27 DIAGNOSIS — R232 Flushing: Secondary | ICD-10-CM | POA: Diagnosis not present

## 2023-09-27 DIAGNOSIS — K219 Gastro-esophageal reflux disease without esophagitis: Secondary | ICD-10-CM | POA: Diagnosis not present

## 2023-09-27 DIAGNOSIS — R2689 Other abnormalities of gait and mobility: Secondary | ICD-10-CM | POA: Diagnosis not present

## 2023-09-29 DIAGNOSIS — M25512 Pain in left shoulder: Secondary | ICD-10-CM | POA: Diagnosis not present

## 2023-09-29 DIAGNOSIS — M5416 Radiculopathy, lumbar region: Secondary | ICD-10-CM | POA: Diagnosis not present

## 2023-10-11 DIAGNOSIS — Z23 Encounter for immunization: Secondary | ICD-10-CM | POA: Diagnosis not present

## 2023-11-22 IMAGING — CT CT HEAD W/O CM
3 series · 15 of 47 positions shown, 18 images · non-contrast
Comparison: May 08, 2020

CLINICAL DATA: Status post fall.

EXAM:
CT HEAD WITHOUT CONTRAST
TECHNIQUE: Contiguous axial images were obtained from the base of the skull
through the vertex without intravenous contrast.

[Series 3: head wo · axial · 0.47mm/px · z∈[-188,-58]mm · 9 of 32 slices shown, 12 images]
[im 3/32  brain]
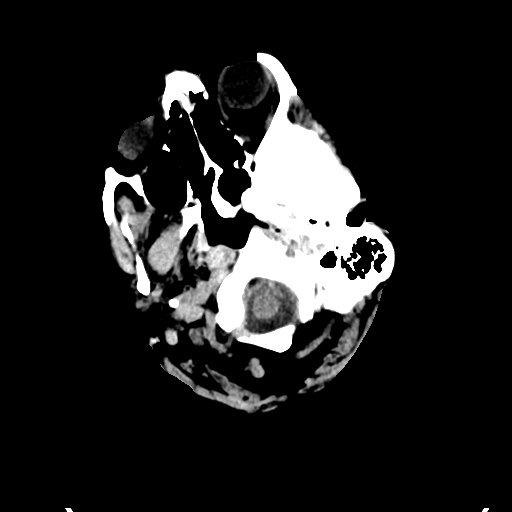
[im 3/32  bone]
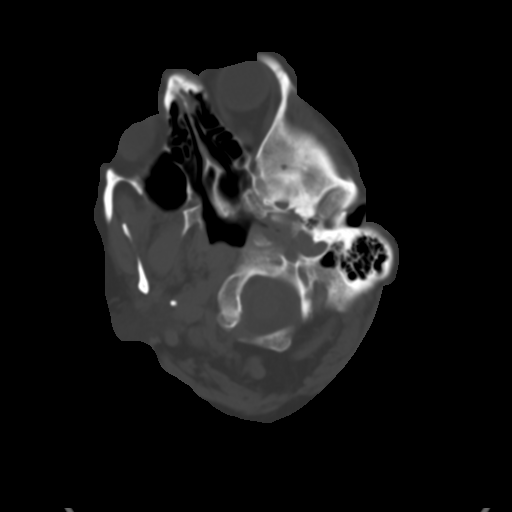
[im 6/32  brain]
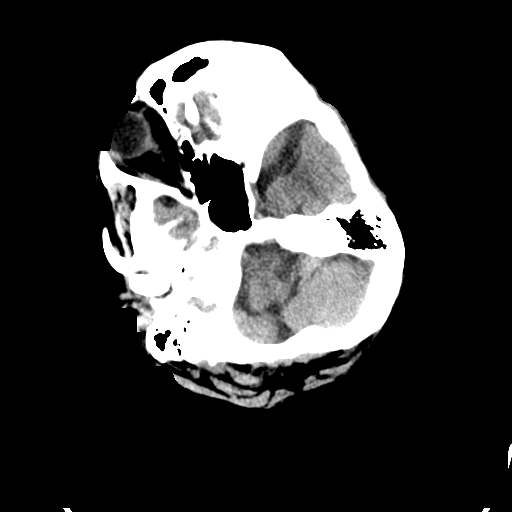
[im 9/32  brain]
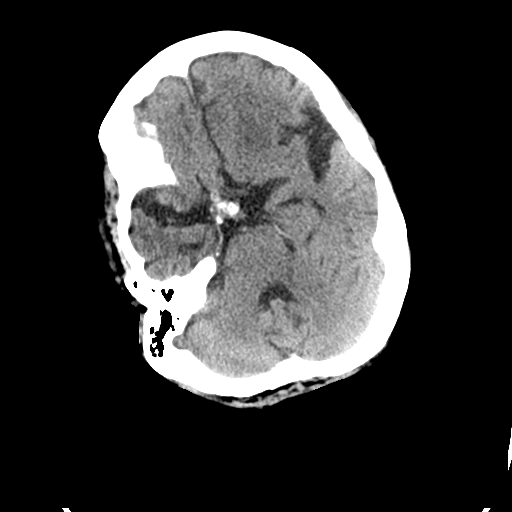
[im 12/32  brain]
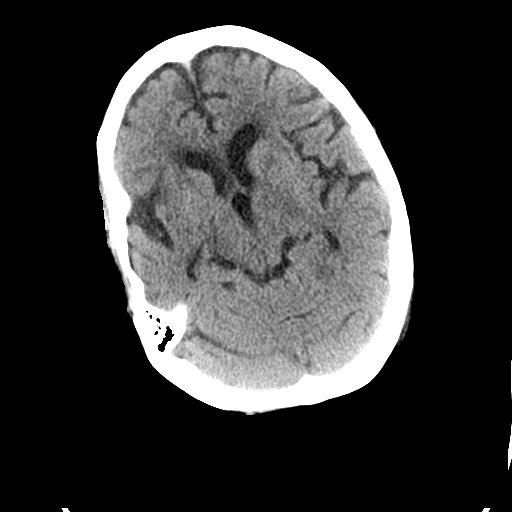
[im 17/32  brain]
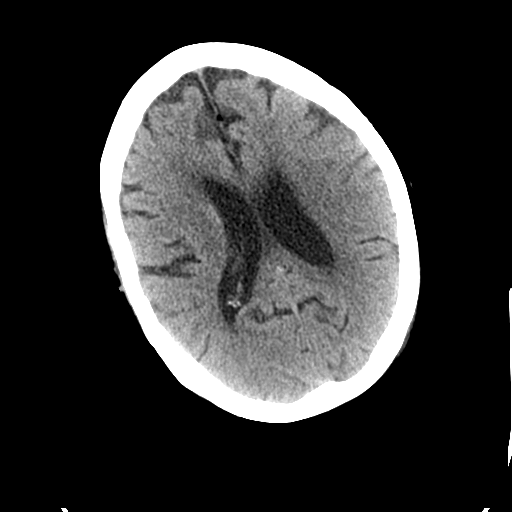
[im 17/32  bone]
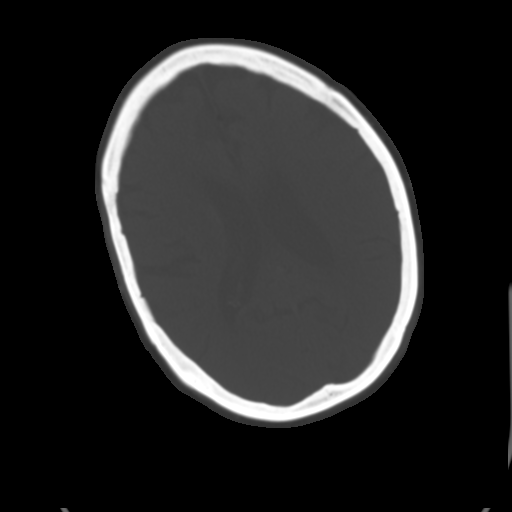
[im 20/32  brain]
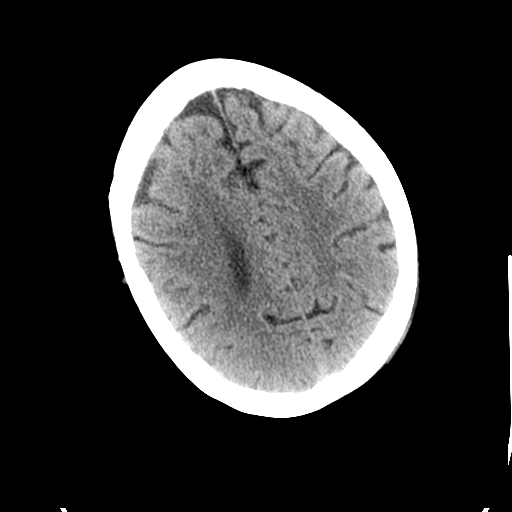
[im 23/32  brain]
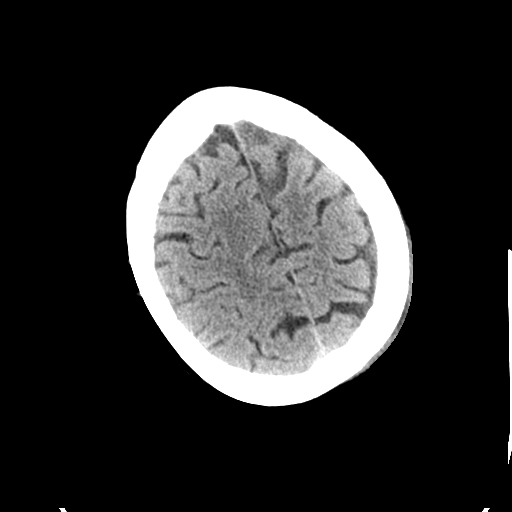
[im 26/32  brain]
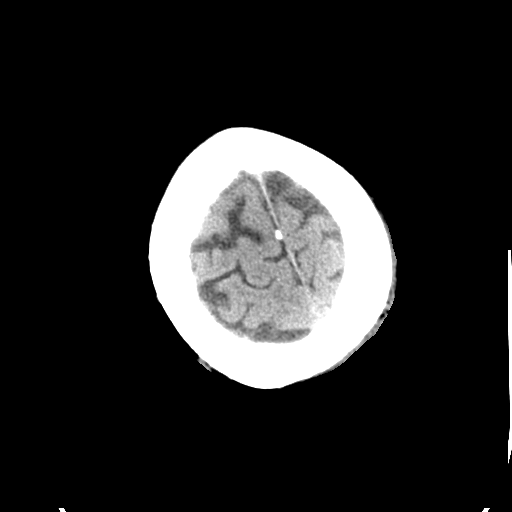
[im 29/32  brain]
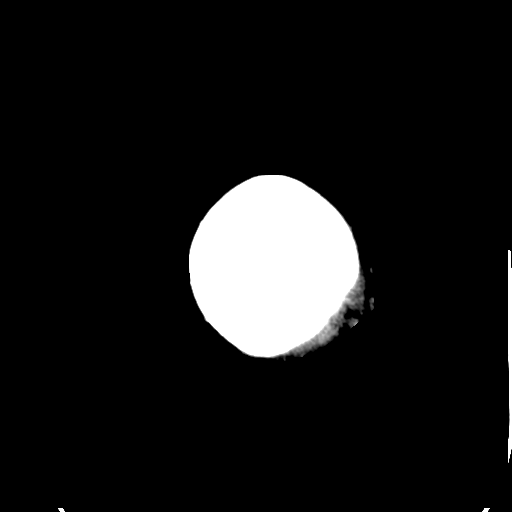
[im 29/32  bone]
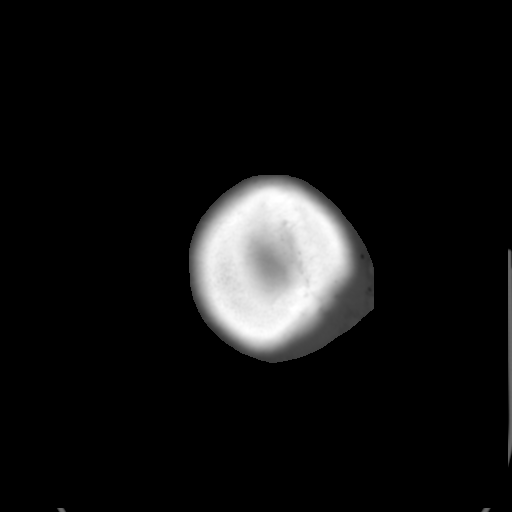

[Series 5: coronal soft tissue · coronal · 0.33mm/px · 3 of 64 slices shown]
[im 22/64  brain]
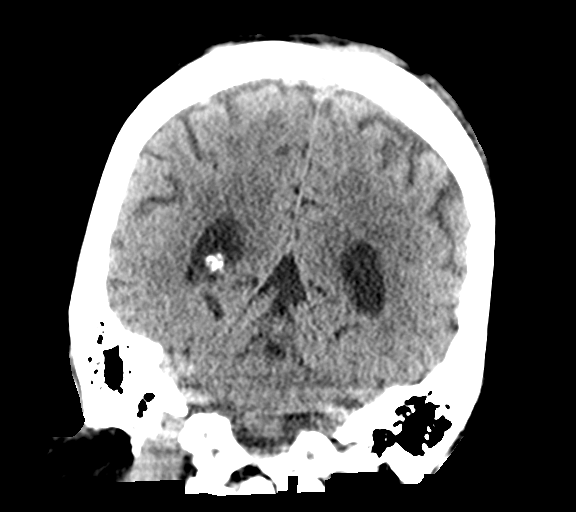
[im 29/64  brain]
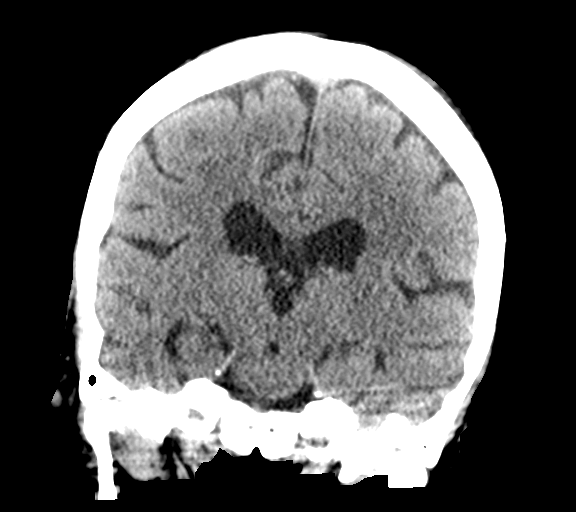
[im 36/64  brain]
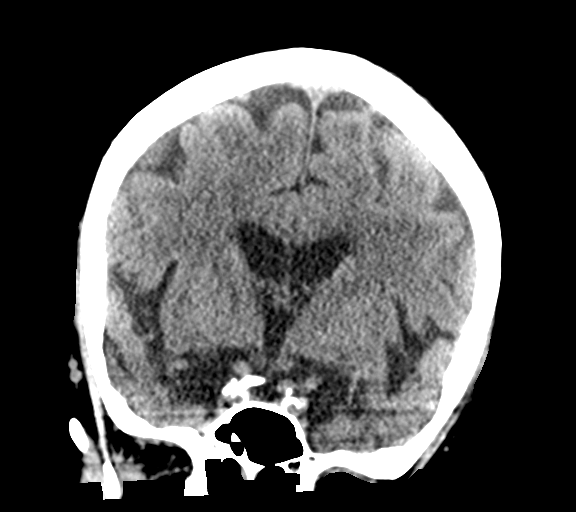

[Series 6: sagittal soft tissue · sagittal · 0.35mm/px · 3 of 49 slices shown]
[im 18/49  brain]
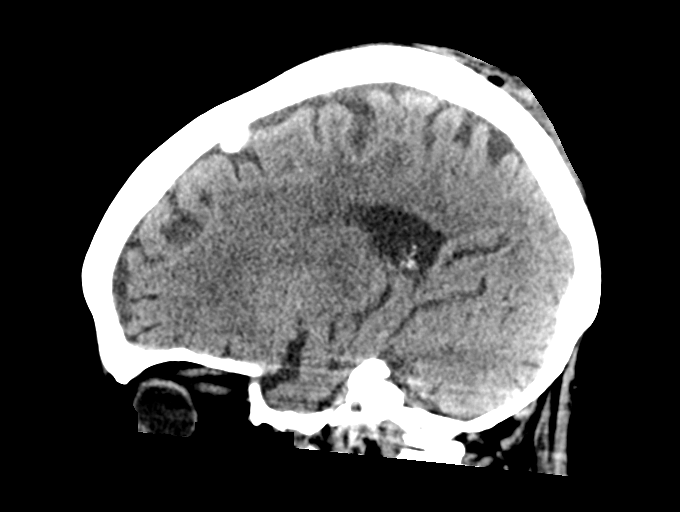
[im 25/49  brain]
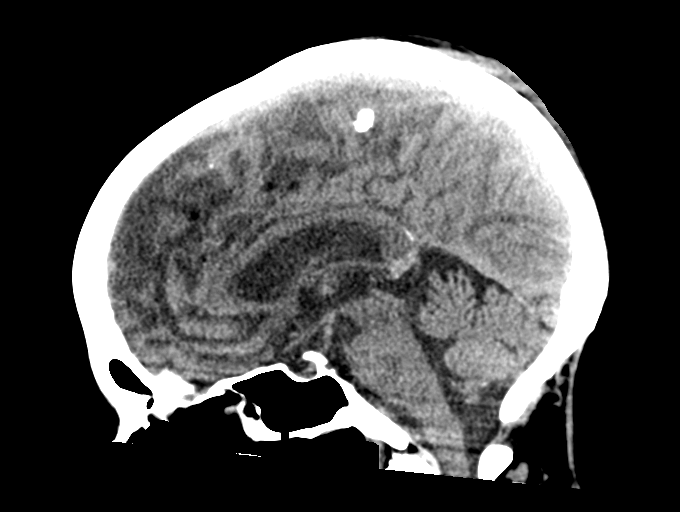
[im 31/49  brain]
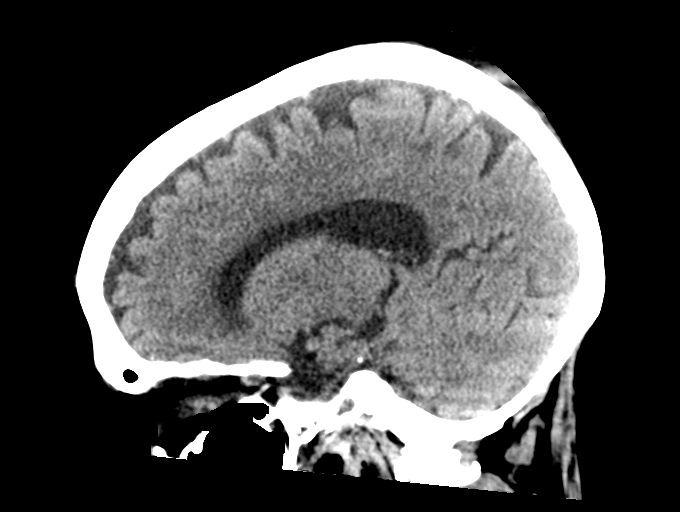

[15 of 47 positions shown; findings below may reference images not displayed]

FINDINGS: Brain: There is mild cerebral atrophy with widening of the
extra-axial spaces and ventricular dilatation.
There are areas of decreased attenuation within the white matter
tracts of the supratentorial brain, consistent with microvascular
disease changes.

A stable 6 mm extra-axial calcification is seen within the left
frontal lobe.

Vascular: No hyperdense vessel or unexpected calcification.

Skull: Normal. Negative for fracture or focal lesion.

Sinuses/Orbits: No acute finding.

Other: There is mild to moderate severity left posterior parietal
scalp soft tissue swelling. This extends superiorly towards the
vertex.
IMPRESSION: 1. Mild to moderate severity left posterior parietal scalp soft
tissue swelling without evidence of an acute fracture or acute
intracranial abnormality.
2. Stable 6 mm extra-axial calcification within the left frontal
lobe which may represent a small meningioma.
3. Generalized cerebral atrophy.

## 2023-11-22 IMAGING — CT CT CERVICAL SPINE W/O CM
3 of 4 series · 13 of 33 positions shown, 16 images · non-contrast
Comparison: None.

CLINICAL DATA: Status post fall.

EXAM:
CT CERVICAL SPINE WITHOUT CONTRAST
TECHNIQUE: Multidetector CT imaging of the cervical spine was performed without
intravenous contrast. Multiplanar CT image reconstructions were also
generated.

[Series 5: orthogonal bone · axial · 0.31mm/px · z∈[-352,-228]mm · 5 of 99 slices shown, 7 images]
[im 17/99  soft-tissue]
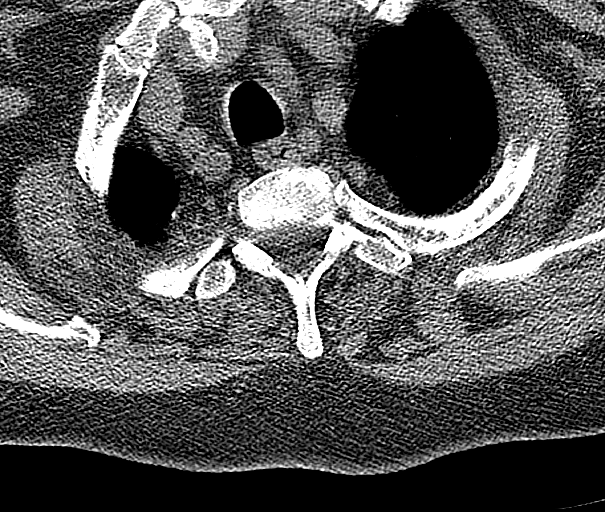
[im 17/99  bone]
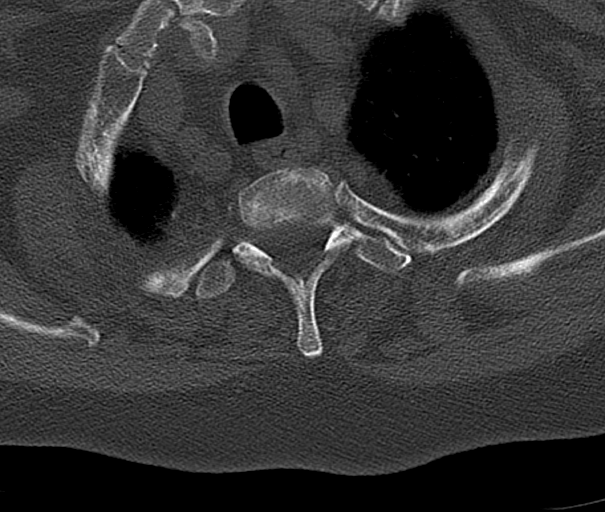
[im 33/99  bone]
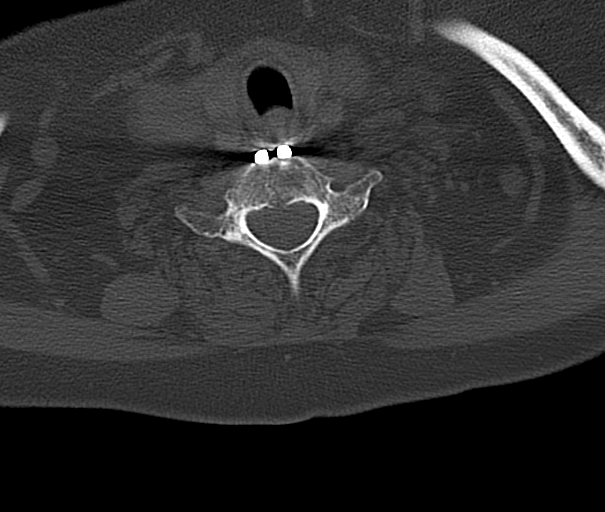
[im 50/99  bone]
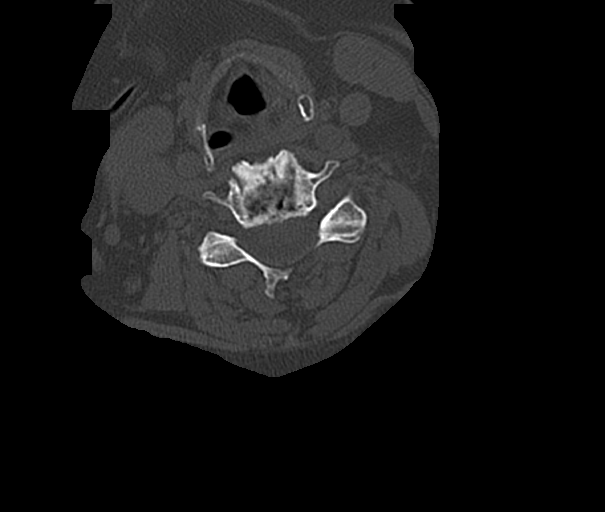
[im 66/99  bone]
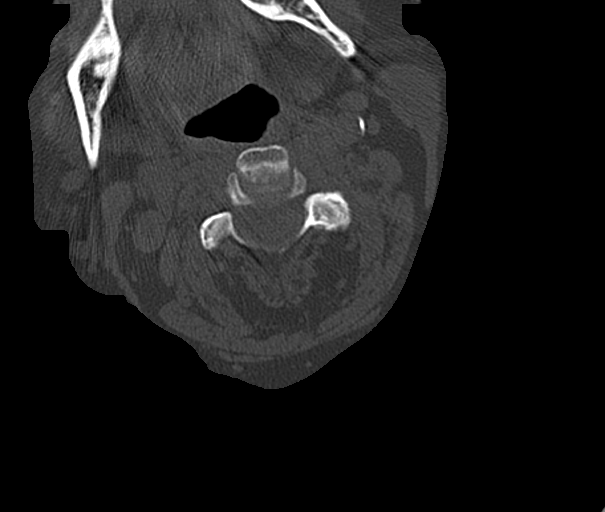
[im 82/99  soft-tissue]
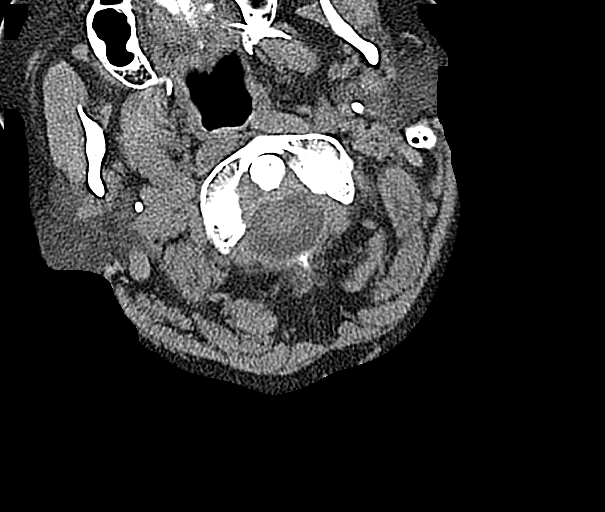
[im 82/99  bone]
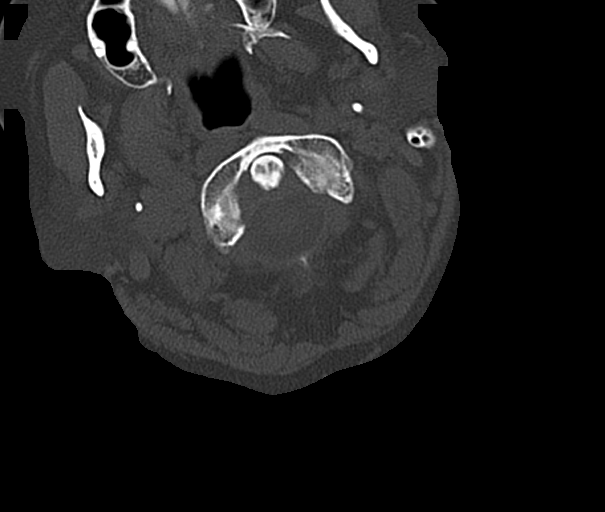

[Series 6: coronal bone · coronal · 0.35mm/px · 3 of 50 slices shown]
[im 10/50  bone]
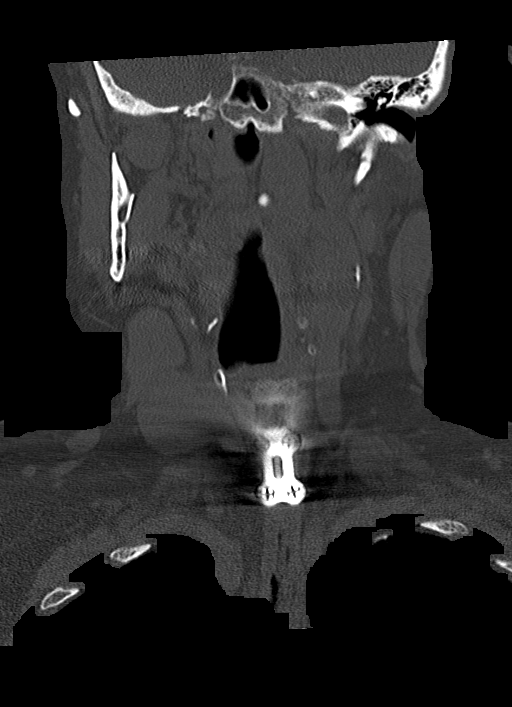
[im 20/50  bone]
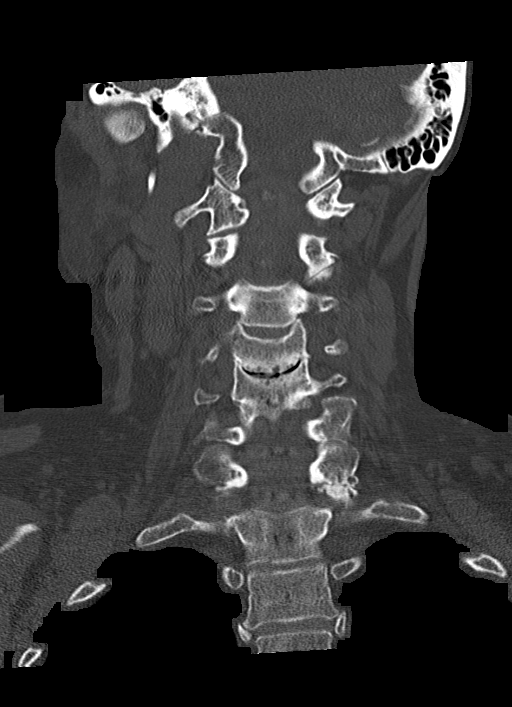
[im 30/50  bone]
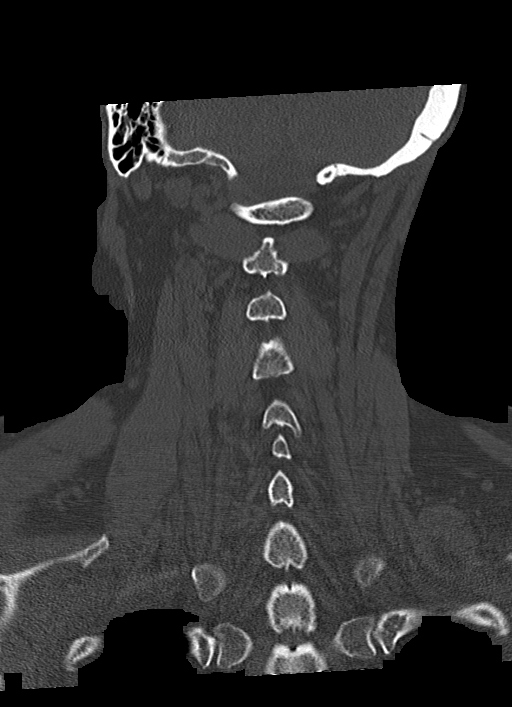

[Series 7: sagittal bone · sagittal · 0.37mm/px · 5 of 51 slices shown, 6 images]
[im 17/51  bone]
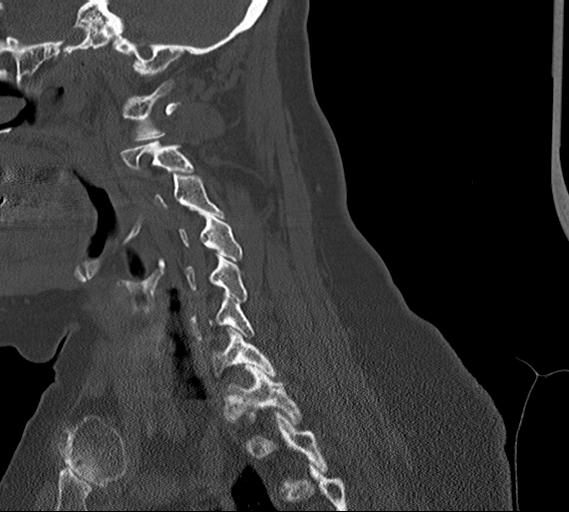
[im 21/51  bone]
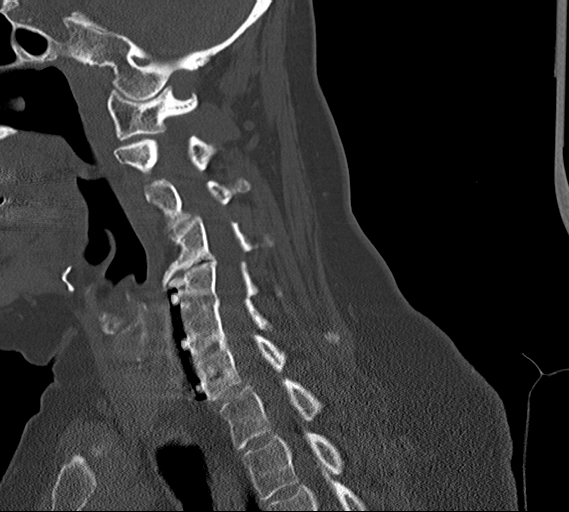
[im 26/51  soft-tissue]
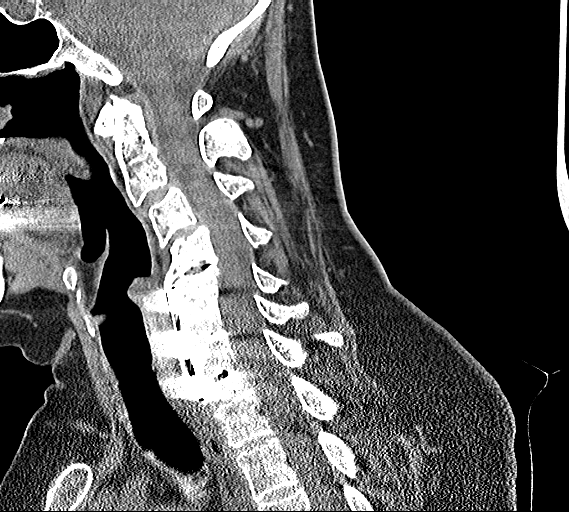
[im 26/51  bone]
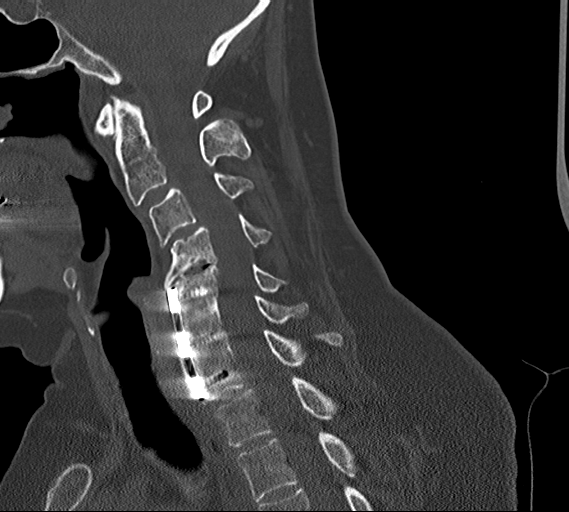
[im 30/51  bone]
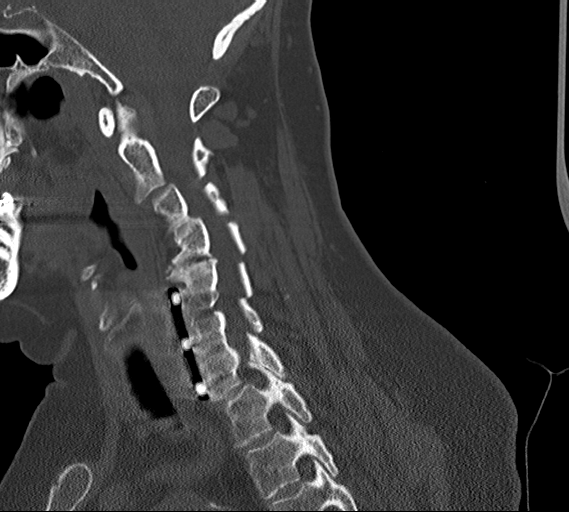
[im 34/51  bone]
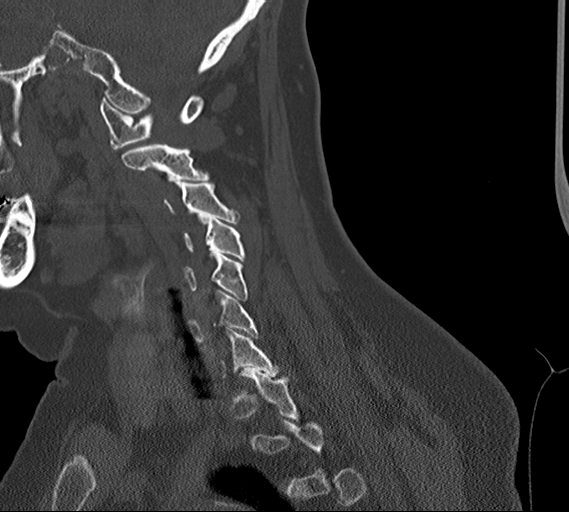

[13 of 33 positions shown; findings below may reference images not displayed]

FINDINGS: Alignment: There is reversal of the normal cervical spine lordosis.

Approximately 1 mm anterolisthesis of the C7 vertebral body is noted
on T1.

Skull base and vertebrae: No acute fracture. Chronic degenerative
changes are seen involving the body and tip of the dens. No primary
bone lesion or focal pathologic process.

Soft tissues and spinal canal: No prevertebral fluid or swelling. No
visible canal hematoma.

Disc levels: There is mild endplate sclerosis at the level of C3-C4.
Marked severity endplate sclerosis is seen at the level of C4-C5.

A metallic density fusion plate and screws are seen along the
anterior aspects of the C5, C6 and C7 vertebral bodies.

Bilateral mild to moderate severity facet joint hypertrophy is seen.
This is most prominent at the level of C3-C4.

Upper chest: Negative.

Other: None.
IMPRESSION: 1. Status post anterior cervical fusion at the levels of C5 through
C7.
2. Multilevel degenerative changes, most prominent at the level of
C4-C5.
3. No acute cervical spine fracture.

## 2023-12-08 DIAGNOSIS — H52203 Unspecified astigmatism, bilateral: Secondary | ICD-10-CM | POA: Diagnosis not present

## 2023-12-08 DIAGNOSIS — H2513 Age-related nuclear cataract, bilateral: Secondary | ICD-10-CM | POA: Diagnosis not present

## 2024-01-31 ENCOUNTER — Other Ambulatory Visit: Payer: Self-pay | Admitting: Allergy and Immunology

## 2024-01-31 DIAGNOSIS — J4540 Moderate persistent asthma, uncomplicated: Secondary | ICD-10-CM

## 2024-03-14 ENCOUNTER — Ambulatory Visit: Admitting: Allergy and Immunology
